# Patient Record
Sex: Male | Born: 1965
Health system: Southern US, Community
[De-identification: ages and names within clinical notes are randomized; demographics above are authoritative.]

## PROBLEM LIST (undated history)

## (undated) DIAGNOSIS — G709 Myoneural disorder, unspecified: Secondary | ICD-10-CM

## (undated) DIAGNOSIS — L02419 Cutaneous abscess of limb, unspecified: Secondary | ICD-10-CM

## (undated) DIAGNOSIS — R55 Syncope and collapse: Secondary | ICD-10-CM

## (undated) DIAGNOSIS — I6529 Occlusion and stenosis of unspecified carotid artery: Secondary | ICD-10-CM

## (undated) DIAGNOSIS — I442 Atrioventricular block, complete: Secondary | ICD-10-CM

## (undated) DIAGNOSIS — Z950 Presence of cardiac pacemaker: Secondary | ICD-10-CM

## (undated) DIAGNOSIS — M51369 Other intervertebral disc degeneration, lumbar region without mention of lumbar back pain or lower extremity pain: Secondary | ICD-10-CM

## (undated) DIAGNOSIS — I10 Essential (primary) hypertension: Secondary | ICD-10-CM

## (undated) DIAGNOSIS — L03119 Cellulitis of unspecified part of limb: Secondary | ICD-10-CM

## (undated) DIAGNOSIS — E785 Hyperlipidemia, unspecified: Secondary | ICD-10-CM

## (undated) DIAGNOSIS — I429 Cardiomyopathy, unspecified: Secondary | ICD-10-CM

## (undated) DIAGNOSIS — Q688 Other specified congenital musculoskeletal deformities: Secondary | ICD-10-CM

## (undated) DIAGNOSIS — M5136 Other intervertebral disc degeneration, lumbar region: Secondary | ICD-10-CM

## (undated) HISTORY — DX: Essential (primary) hypertension: I10

## (undated) HISTORY — DX: Hyperlipidemia, unspecified: E78.5

## (undated) HISTORY — DX: Syncope and collapse: R55

## (undated) HISTORY — DX: Occlusion and stenosis of unspecified carotid artery: I65.29

## (undated) HISTORY — PX: OTHER SURGICAL HISTORY: SHX169

## (undated) HISTORY — DX: Cellulitis of unspecified part of limb: L03.119

## (undated) HISTORY — DX: Other intervertebral disc degeneration, lumbar region: M51.36

## (undated) HISTORY — DX: Other specified congenital musculoskeletal deformities: Q68.8

## (undated) HISTORY — DX: Cutaneous abscess of limb, unspecified: L02.419

## (undated) HISTORY — DX: Other intervertebral disc degeneration, lumbar region without mention of lumbar back pain or lower extremity pain: M51.369

---

## 1986-04-02 HISTORY — PX: PATELLA FRACTURE SURGERY: SHX735

## 1991-04-03 HISTORY — PX: PATELLA FRACTURE SURGERY: SHX735

## 2007-06-17 ENCOUNTER — Ambulatory Visit: Payer: Self-pay | Admitting: *Deleted

## 2008-03-30 ENCOUNTER — Ambulatory Visit: Payer: Self-pay | Admitting: Cardiology

## 2008-03-30 ENCOUNTER — Observation Stay (HOSPITAL_COMMUNITY): Admission: EM | Admit: 2008-03-30 | Discharge: 2008-03-31 | Payer: Self-pay | Admitting: Emergency Medicine

## 2008-03-31 ENCOUNTER — Encounter: Payer: Self-pay | Admitting: Cardiology

## 2008-04-06 ENCOUNTER — Ambulatory Visit: Payer: Self-pay

## 2008-04-06 ENCOUNTER — Ambulatory Visit: Payer: Self-pay | Admitting: Cardiology

## 2008-04-06 LAB — CONVERTED CEMR LAB
Basophils Absolute: 0 10*3/uL (ref 0.0–0.1)
Eosinophils Relative: 1.9 % (ref 0.0–5.0)
Lymphocytes Relative: 25.5 % (ref 12.0–46.0)
MCHC: 33.8 g/dL (ref 30.0–36.0)
MCV: 94.2 fL (ref 78.0–100.0)
Monocytes Relative: 8.4 % (ref 3.0–12.0)
Neutrophils Relative %: 64.1 % (ref 43.0–77.0)
RDW: 13 % (ref 11.5–14.6)
WBC: 10 10*3/uL (ref 4.5–10.5)

## 2008-04-08 ENCOUNTER — Ambulatory Visit: Payer: Self-pay | Admitting: Cardiology

## 2008-04-28 ENCOUNTER — Ambulatory Visit (HOSPITAL_COMMUNITY): Admission: RE | Admit: 2008-04-28 | Discharge: 2008-04-28 | Payer: Self-pay | Admitting: Surgery

## 2008-04-28 ENCOUNTER — Encounter (INDEPENDENT_AMBULATORY_CARE_PROVIDER_SITE_OTHER): Payer: Self-pay | Admitting: Surgery

## 2008-05-18 ENCOUNTER — Ambulatory Visit: Payer: Self-pay | Admitting: Cardiology

## 2008-05-18 ENCOUNTER — Ambulatory Visit: Payer: Self-pay

## 2008-05-18 LAB — CONVERTED CEMR LAB
ALT: 38 units/L (ref 0–53)
Albumin: 4.1 g/dL (ref 3.5–5.2)
Alkaline Phosphatase: 74 units/L (ref 39–117)
Bilirubin, Direct: 0.1 mg/dL (ref 0.0–0.3)
Cholesterol: 138 mg/dL (ref 0–200)
Total Bilirubin: 0.8 mg/dL (ref 0.3–1.2)
Total CHOL/HDL Ratio: 4.2

## 2008-08-16 ENCOUNTER — Encounter (INDEPENDENT_AMBULATORY_CARE_PROVIDER_SITE_OTHER): Payer: Self-pay | Admitting: *Deleted

## 2008-10-05 ENCOUNTER — Ambulatory Visit (HOSPITAL_COMMUNITY): Admission: RE | Admit: 2008-10-05 | Discharge: 2008-10-05 | Payer: Self-pay | Admitting: Orthopedic Surgery

## 2008-10-08 ENCOUNTER — Encounter: Admission: RE | Admit: 2008-10-08 | Discharge: 2008-10-08 | Payer: Self-pay | Admitting: Orthopedic Surgery

## 2008-10-19 ENCOUNTER — Encounter: Admission: RE | Admit: 2008-10-19 | Discharge: 2008-10-19 | Payer: Self-pay | Admitting: Orthopedic Surgery

## 2009-05-31 ENCOUNTER — Observation Stay (HOSPITAL_COMMUNITY)
Admission: EM | Admit: 2009-05-31 | Discharge: 2009-06-01 | Payer: Self-pay | Source: Home / Self Care | Admitting: Emergency Medicine

## 2009-05-31 ENCOUNTER — Ambulatory Visit: Payer: Self-pay | Admitting: Cardiovascular Disease

## 2009-06-01 ENCOUNTER — Encounter (INDEPENDENT_AMBULATORY_CARE_PROVIDER_SITE_OTHER): Payer: Self-pay | Admitting: Internal Medicine

## 2009-06-09 ENCOUNTER — Ambulatory Visit: Payer: Self-pay | Admitting: Cardiology

## 2009-06-28 DIAGNOSIS — E785 Hyperlipidemia, unspecified: Secondary | ICD-10-CM

## 2009-06-28 DIAGNOSIS — R55 Syncope and collapse: Secondary | ICD-10-CM

## 2009-06-29 ENCOUNTER — Ambulatory Visit: Payer: Self-pay | Admitting: Internal Medicine

## 2009-06-29 ENCOUNTER — Encounter: Payer: Self-pay | Admitting: Physician Assistant

## 2009-06-29 DIAGNOSIS — L02419 Cutaneous abscess of limb, unspecified: Secondary | ICD-10-CM

## 2009-06-29 DIAGNOSIS — L03119 Cellulitis of unspecified part of limb: Secondary | ICD-10-CM

## 2009-06-29 DIAGNOSIS — Q743 Arthrogryposis multiplex congenita: Secondary | ICD-10-CM

## 2009-07-19 ENCOUNTER — Telehealth (INDEPENDENT_AMBULATORY_CARE_PROVIDER_SITE_OTHER): Payer: Self-pay | Admitting: *Deleted

## 2009-08-09 ENCOUNTER — Ambulatory Visit: Payer: Self-pay | Admitting: Cardiology

## 2009-08-09 DIAGNOSIS — R9430 Abnormal result of cardiovascular function study, unspecified: Secondary | ICD-10-CM | POA: Insufficient documentation

## 2009-08-17 ENCOUNTER — Ambulatory Visit: Payer: Self-pay | Admitting: Cardiology

## 2009-08-18 LAB — CONVERTED CEMR LAB
CO2: 26 meq/L (ref 19–32)
Glucose, Bld: 125 mg/dL — ABNORMAL HIGH (ref 70–99)
Potassium: 4.3 meq/L (ref 3.5–5.1)

## 2010-03-02 ENCOUNTER — Telehealth: Payer: Self-pay | Admitting: Cardiology

## 2010-03-10 ENCOUNTER — Ambulatory Visit: Payer: Self-pay | Admitting: Cardiology

## 2010-03-13 LAB — CONVERTED CEMR LAB
Calcium: 10.1 mg/dL (ref 8.4–10.5)
GFR calc non Af Amer: 206.76 mL/min (ref >60.00)

## 2010-03-28 ENCOUNTER — Ambulatory Visit: Payer: Self-pay | Admitting: Internal Medicine

## 2010-03-29 LAB — CONVERTED CEMR LAB
ALT: 33 units/L (ref 0–53)
Alkaline Phosphatase: 75 units/L (ref 39–117)
BUN: 18 mg/dL (ref 6–23)
Basophils Relative: 0.3 % (ref 0.0–3.0)
Blood, UA: NEGATIVE
Chloride: 105 meq/L (ref 96–112)
Cholesterol: 220 mg/dL — ABNORMAL HIGH (ref 0–200)
Creatinine, Ser: 0.5 mg/dL (ref 0.4–1.5)
Direct LDL: 153 mg/dL
Eosinophils Absolute: 0.3 10*3/uL (ref 0.0–0.7)
Eosinophils Relative: 1.8 % (ref 0.0–5.0)
Glucose, Bld: 82 mg/dL (ref 70–99)
HCT: 47.2 % (ref 39.0–52.0)
HDL: 34.7 mg/dL — ABNORMAL LOW (ref >39.00)
Ketones, ur: NEGATIVE mg/dL
Lymphocytes Relative: 31.2 % (ref 12.0–46.0)
MCHC: 34 g/dL (ref 30.0–36.0)
Monocytes Absolute: 0.8 10*3/uL (ref 0.1–1.0)
Monocytes Relative: 5.8 % (ref 3.0–12.0)
Neutrophils Relative %: 60.9 % (ref 43.0–77.0)
Potassium: 4.8 meq/L (ref 3.5–5.1)
RBC: 5.03 M/uL (ref 4.22–5.81)
RDW: 13.7 % (ref 11.5–14.6)
Sodium: 138 meq/L (ref 135–145)
TSH: 1.44 microintl units/mL (ref 0.35–5.50)
Urobilinogen, UA: 0.2 (ref 0.0–1.0)

## 2010-03-30 ENCOUNTER — Encounter: Payer: Self-pay | Admitting: Internal Medicine

## 2010-03-30 ENCOUNTER — Ambulatory Visit: Payer: Self-pay | Admitting: Internal Medicine

## 2010-03-30 DIAGNOSIS — M129 Arthropathy, unspecified: Secondary | ICD-10-CM | POA: Insufficient documentation

## 2010-03-31 DIAGNOSIS — M5137 Other intervertebral disc degeneration, lumbosacral region: Secondary | ICD-10-CM

## 2010-03-31 DIAGNOSIS — H6120 Impacted cerumen, unspecified ear: Secondary | ICD-10-CM | POA: Insufficient documentation

## 2010-03-31 DIAGNOSIS — I10 Essential (primary) hypertension: Secondary | ICD-10-CM

## 2010-03-31 DIAGNOSIS — R9431 Abnormal electrocardiogram [ECG] [EKG]: Secondary | ICD-10-CM | POA: Insufficient documentation

## 2010-05-02 NOTE — Assessment & Plan Note (Signed)
Summary: eph/jml  Medications Added CELEBREX 200 MG CAPS (CELECOXIB) take one daily LOTENSIN 10 MG TABS (BENAZEPRIL HCL) take one tablet by mouth daily        Visit Type:  Follow-up  CC:  occ. dizziness.  History of Present Illness: This is a 45 year old white male patient, who was admitted to [facility] with syncope, secondary to micturition. 2-D echo was performed and showed abnormal septal motion. The cavity size was normal. Wall thickness was increased in a pattern of severe LVH. Ejection fraction 50-55%.  Patient was sent home with a CardioNet monitor, which showed sinus rhythm with first degree AV block interventricular conduction delay, some sinus tachycardia, PACs, ventricular couplet.  The patient had one episode of dizziness while at work when he stood up from a sitting position to go to the restroom. He stood there a few seconds, and it resolved. He had no syncope. He denies palpitations, dyspnea, or dyspnea on exertion.  Current Medications (verified): 1)  Celebrex 200 Mg Caps (Celecoxib) .... Take One Daily  Social History: Reviewed history from 06/28/2009 and no changes required. He lives in [place] with his wife.  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  He   denies tobacco, alcohol, or drug use.  He has limited mobility, but   tries leg lifts twice a day.  He is able to walk for routine things, but   if he needed to be in a seat for a while, he would use a wheelchair or   scooter.   Review of Systems       see history of present illness. He has no limitations because of his congenital arthrogyroposis Multiplex  Vital Signs:  Patient profile:   45 year old male Height:      66 inches Weight:      191 pounds BMI:     30.94 Pulse rate:   92 / minute Pulse (ortho):   101 / minute Pulse rhythm:   regular BP sitting:   134 / 99  (right arm) BP standing:   146 / 105  Vitals Entered By: Jacquelin Hawking, CMA  (June 29, 2009 9:00 AM)  Serial Vital Signs/Assessments:  Time      Position  BP       Pulse  Resp  Temp     By 9:48 AM   Sitting   136/99   86                    Ollen Gross, RN, BSN 9:48 AM   Standing  146/105  101                   Ollen Gross, RN, BSN           Standing  151/111  96                    Ollen Gross, RN, BSN           Standing  146/106  101                   Ollen Gross, RN, BSN   Physical Exam  General:   Well-nournished, in no acute distress.short stature Neck: No JVD, HJR, Bruit, or thyroid enlargement Lungs: No tachypnea, clear without wheezing, rales, or rhonchi Cardiovascular: RRR, PMI not displaced, heart sounds normal,positive S4, no murmurs, bruit, thrill, or heave. Abdomen: BS normal. Soft without organomegaly, masses, lesions  or tenderness. Extremities:slight cyanosis in the right lower leg, which is chronic, bilateral ankle edema,Good distal pulses bilateral SKin: Warm, no lesions or rashes  Musculoskeletal: arm and leg deformities, making it difficult to walk Neuro: no focal signs    EKG  Procedure date:  06/29/2009  Findings:      sinus rhythm with first-degree AV block and right bundle branch block  Impression & Recommendations:  Problem # 1:  HYPERTENSION, HX OF (ICD-V12.50) Patient's blood pressure is elevated today, and his blood pressures actually go up when we did orthostatics. We could not lay him down because of his congenital abnormality. He could not get up on the table. He had severe LVH on his 2-D echo. I discussed this patient with Dr. Jens Som, who recommends an ACE inhibitor for his hypertension. We talked about a beta blocker, but he has a first degree AV block and right bundle branch block on EKG so we chose Ace inhibitor  Problem # 2:  CELLULITIS, LEG, RIGHT (ICD-682.6) Cellulitis of the right lower extremity has resolved  Problem # 3:  SYNCOPE (ICD-780.2)  Patient has not had any further syncope. He had one episode  of dizziness when he got up to go to the bathroom. His blood pressure actually increases when he gets up because physically. He has to work so hard to change positions.CardioNet monitor did not reveal any arrhythmias that would cause syncope. His updated medication list for this problem includes:    Lotensin 10 Mg Tabs (Benazepril hcl) .Marland Kitchen... 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  Patient Instructions: 1)  Your physician recommends that you schedule a follow-up appointment in: 1 month with Dr. Jens Som. 2)  Your physician has recommended you make the following change in your medication: Lotensin 10 mg. Take one tablet by mouth once a day. 3)  Your physician has requested that you limit the intake of sodium (salt) in your diet to two grams daily. Please see [facility] handout. 4)  Check Blood Pressure at home and call if it remains greater than 135/85 Prescriptions: LOTENSIN 10 MG TABS (BENAZEPRIL HCL) take one tablet by mouth daily  #30 x 6   Entered by:   Ollen Gross, RN, BSN   Authorized by:   Marletta Lor, PA-C   Signed by:   Ollen Gross, RN, BSN on 06/29/2009   Method used:   Electronically to        [facility]* (retail)       [address]       [place], [place]  [id number]       Ph: [phone]       Fax: [phone]   RxID:   [id number]

## 2010-05-02 NOTE — Assessment & Plan Note (Signed)
Summary: 1 MONTH ROV.SL  Medications Added LOTENSIN 20 MG TABS (BENAZEPRIL HCL) 1 tablet by mouth daily      Allergies Added: NKDA  Visit Type:  Follow-up Primary Provider:  none  CC:  pt  has been doing good no dizziness.  History of Present Illness: William Hartman is a very pleasant gentleman with a history of micturition syncope.   An echocardiogram in March of 2011 revealed  an ejection fraction of 50- 55% with severe LVH and mild left atrial enlargement.  A monitor showed sinus rhythm with first degree AV block, PVCs and couplets. A Myoview performed on April 06, 2008 for  left upper extremity numbness and an abnormal electrocardiogram showed an ejection fraction of 75%.  There was a prior lateral infarct versus soft tissue attenuation (the patient was imaged with his arms down).  There was also a question of very mild lateral ischemia.  He is felt to be at low risk.  since he was discharged from the hospital in March he was seen in the office and his blood pressure was elevated. An ACE inhibitor was added. Since then the patient denies any dyspnea on exertion, orthopnea, PND, pedal edema, palpitations, syncope or chest pain.   Current Medications (verified): 1)  Celebrex 200 Mg Caps (Celecoxib) .... Take One Daily 2)  Lotensin 10 Mg Tabs (Benazepril Hcl) .... Take One Tablet By Mouth Daily  Allergies (verified): No Known Drug Allergies  Past History:  Past Medical History:  1. Syncope.   2. History of abnormal ECG.       a.     History of first-degree AV block, right bundle-branch block,        and inferior T-wave inversion dating back to December 2009.       b.     A 2-D echocardiogram March 31, 2008, EF 55%.       c.     April 06, 2008, pharmacologic Myoview showing an EF of 75%        with question of mild lateral ischemia and lateral infarct,        although the patient had imaging with arms down.  Felt to be low        risk study.   3. Hyperlipidemia.   4. History of  right lower extremity cellulitis.   5. Hypertension.   6. History of left upper extremity numbness in December 2009 with       negative neurologic evaluation.   7. Status post right lateral knee mass excision.   8. Arthrogryposis multiplex congenita with multiple surgeries as a       child.      Past Surgical History: Reviewed history from 06/28/2009 and no changes required. Status post right lateral knee mass excision.  Arthrogryposis multiplex congenita with multiple surgeries as a       child.   Social History: Reviewed history from 06/28/2009 and no changes required. He lives in [place] with his wife.  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  He   denies tobacco, alcohol, or drug use.  He has limited mobility, but   tries leg lifts twice a day.  He is able to walk for routine things, but   if he needed to be in a seat for a while, he would use a wheelchair or   scooter.   Review of Systems       no fevers or chills, productive cough, hemoptysis, dysphasia, odynophagia,  melena, hematochezia, dysuria, hematuria, rash, seizure activity, orthopnea, PND, pedal edema, claudication. Remaining systems are negative.   Vital Signs:  Patient profile:   45 year old male Height:      66 inches Weight:      188 pounds BMI:     30.45 Pulse rate:   94 / minute BP sitting:   132 / 90  (left arm)  Vitals Entered By: Burnett Kanaris, CNA (Aug 09, 2009 8:16 AM)  Physical Exam  General:  Well-developed well-nourished in no acute distress.  Skin is warm and dry.  HEENT is normal.  Neck is supple. No thyromegaly.  Chest is clear to auscultation with normal expansion.  Cardiovascular exam is regular rate and rhythm.  Abdominal exam nontender or distended. No masses palpated. Extremities show Trace edema; there is mild erythema over the lower shin area from prior infection. Note he has congenital abnormalities of all of his extremities. neuro  grossly intact    Impression & Recommendations:  Problem # 1:  HYPERTENSION, HX OF (ICD-V12.50) Blood pressure elevated. Increase Lotensin to 20 mg p.o. daily. Bmet in one week.  Problem # 2:  SYNCOPE (ICD-780.2) No recurrent episodes. This was felt secondary to micturition previously. His updated medication list for this problem includes:    Lotensin 20 Mg Tabs (Benazepril hcl) .Marland Kitchen... 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 I've asked him to establish with a primary care physician for his long term noncardiac care.  Problem # 5:  CARDIOVASCULAR FUNCTION STUDY, ABNORMAL (ICD-794.30) No symptoms. No further workup at this time. Consider repeat in future.  Patient Instructions: 1)  Your physician recommends that you schedule a follow-up appointment in: 12 months with Dr. Jens Som 2)  Your physician recommends that you return for lab work in 1 WEEK FOR  BMET  401.1, 780.2 3)  Your physician has recommended you make the following change in your medication: LOTENSIN INCREASED TO 20MG  DAILY Prescriptions: LOTENSIN 20 MG TABS (BENAZEPRIL HCL) 1 tablet by mouth daily  #30 x 11   Entered by:   Lisabeth Devoid RN   Authorized by:   Ferman Hamming, MD, [facility]   Signed by:   Lisabeth Devoid RN on 08/09/2009   Method used:   Electronically to        [facility]* (retail)       [address]       [place], [place]  [id number]       Ph: [phone]       Fax: [phone]   RxID:   [id number]

## 2010-05-02 NOTE — Progress Notes (Signed)
   Phone Note Outgoing Call   Call placed by: Deliah Goody, RN,  July 19, 2009 5:00 PM Summary of Call: event monitor reviewed by dr Jens Som and shows sinus to sinus tach with 1st degree AV block Left message to call back Deliah Goody, RN  July 19, 2009 5:02 PM   Follow-up for Phone Call        pt aware of results Deliah Goody, RN  July 20, 2009 11:35 AM

## 2010-05-02 NOTE — Progress Notes (Signed)
Summary: calling with medication questions  Medications Added LOSARTAN POTASSIUM 50 MG TABS (LOSARTAN POTASSIUM) 1 once daily       Phone Note Call from Patient Call back at Home Phone [phone]   Caller: Patient Summary of Call: Pt calling with questions about changing his medication Initial call taken by: Judie Grieve,  March 02, 2010 4:28 PM  Follow-up for Phone Call        spoke with pt, since he started the benazepril he has a cough. he stopped the med about one month ago and the cough went away. his bp is running 90 to 100 on the bottom. he uses [facility]. he wants a message left at [phone] if he does not answer. will foward for dr Jens Som review Deliah Goody, RN  March 02, 2010 5:18 PM  Additional Follow-up for Phone Call Additional follow up Details #1::        dc lotensin; cozaar 50 mg by mouth daily. BMET one week Ferman Hamming, MD, [facility]  March 02, 2010 7:16 PM     Additional Follow-up for Phone Call Additional follow up Details #2::    LEFT MESSAGE  ON VM THAT MED WAS CALLED IN TO [facility] AND THE NEED TO COME IN NEXT WEEK FOR BLOOD TEST . Follow-up by: Scherrie Bateman, LPN,  March 03, 2010 1:24 PM  New/Updated Medications: LOSARTAN POTASSIUM 50 MG TABS (LOSARTAN POTASSIUM) 1 once daily Prescriptions: LOSARTAN POTASSIUM 50 MG TABS (LOSARTAN POTASSIUM) 1 once daily  #30 x 11   Entered by:   Scherrie Bateman, LPN   Authorized by:   Ferman Hamming, MD, [facility]   Signed by:   Scherrie Bateman, LPN on 21/30/8657   Method used:   Electronically to        [facility] (retail)       [address]       [place], [place]  [id number]       Ph: [phone]       Fax: [phone]   RxID:   [id number]

## 2010-05-04 NOTE — Assessment & Plan Note (Signed)
Summary: New / Cpx / # / cd   Vital Signs:  Patient profile:   45 year old male Height:      66 inches (167.64 cm) Weight:      187.0 pounds (85 kg) Temp:     97.8 degrees F (36.56 degrees C) oral Pulse rate:   64 / minute BP sitting:   128 / 90  (left arm) Cuff size:   regular  Vitals Entered By: Orlan Leavens RMA (March 30, 2010 8:25 AM) CC: New patient Is Patient Diabetic? No Pain Assessment Patient in pain? no        Primary Care Provider:  Newt Lukes MD  CC:  New patient.  History of Present Illness: new pt to me and our divison, but known to our practice with cardiology -here to est care patient is here today for annual physical. Patient feels well and has no complaints.  also reviewed chronic med issues today: HTN - reports compliance with ongoing medical treatment and no changes in medication dose or frequency. denies adverse side effects related to current therapy.   dyslipidemia - hx tx with zocor - stopped due to "not needed anymore" when cholesterol was good - denies adv effects of prior statin tx - intermit compliance with low fat diet  AMC (arthrogryposis multiplex congenita) - mulitple surg for same as child - stable symptoms - able to walk indep since back injections for DDD in 2010 - follows regularly with ortho for same (voytek) - requests rx for new WC rx for "leverage ride" to have front control access rather than manual use of arms/hands  Preventive Screening-Counseling & Management  Alcohol-Tobacco     Alcohol drinks/day: <1     Alcohol Counseling: not indicated; use of alcohol is not excessive or problematic     Smoking Status: never     Tobacco Counseling: not indicated; no tobacco use  Caffeine-Diet-Exercise     Diet Counseling: to improve diet; diet is suboptimal     Does Patient Exercise: yes     Depression Counseling: not indicated; screening negative for depression  Safety-Violence-Falls     Seat Belt Counseling: not indicated;  patient wears seat belts     Helmet Counseling: not indicated; patient wears helmet when riding bicycle/motocycle     Firearm Counseling: not indicated; uses recommended firearm safety measures     Violence Counseling: not indicated; no violence risk noted     Fall Risk Counseling: not indicated; no significant falls noted  Clinical Review Panels:  Prevention   Last PSA:  1.24 (03/28/2010)  Immunizations   Last Tetanus Booster:  Historical (04/02/2008)   Last Pneumovax:  Historical (04/02/2008)  Lipid Management   Cholesterol:  220 (03/28/2010)   LDL (bad choesterol):  69 (05/18/2008)   HDL (good cholesterol):  34.70 (03/28/2010)  CBC   WBC:  13.9 (03/28/2010)   RBC:  5.03 (03/28/2010)   Hgb:  16.0 (03/28/2010)   Hct:  47.2 (03/28/2010)   Platelets:  308.0 (03/28/2010)   MCV  93.8 (03/28/2010)   MCHC  34.0 (03/28/2010)   RDW  13.7 (03/28/2010)   PMN:  60.9 (03/28/2010)   Lymphs:  31.2 (03/28/2010)   Monos:  5.8 (03/28/2010)   Eosinophils:  1.8 (03/28/2010)   Basophil:  0.3 (03/28/2010)  Complete Metabolic Panel   Glucose:  82 (03/28/2010)   Sodium:  138 (03/28/2010)   Potassium:  4.8 (03/28/2010)   Chloride:  105 (03/28/2010)   CO2:  21 (03/28/2010)   BUN:  18 (03/28/2010)   Creatinine:  0.5 (03/28/2010)   Albumin:  4.1 (03/28/2010)   Total Protein:  7.7 (03/28/2010)   Calcium:  9.7 (03/28/2010)   Total Bili:  0.5 (03/28/2010)   Alk Phos:  75 (03/28/2010)   SGPT (ALT):  33 (03/28/2010)   SGOT (AST):  25 (03/28/2010)   Current Medications (verified): 1)  Celebrex 200 Mg Caps (Celecoxib) .... Take One Daily 2)  Losartan Potassium 50 Mg Tabs (Losartan Potassium) .Marland Kitchen.. 1 Once Daily  Allergies (verified): No Known Drug Allergies  Past History:  Past Medical History:  1. Syncope, micturition - hosp 05/2009  2. History of abnormal ECG      a.     History of first-degree AV block, right bundle-branch block,        and inferior T-wave inversion dating back to  December 2009.       b.     A 2-D echocardiogram March 31, 2008, EF 55%.       c.     April 06, 2008, pharmacologic Myoview showing an EF of 75%        with question of mild lateral ischemia and lateral infarct,        although the patient had imaging with arms down.  Felt to be low        risk study.   3. Hyperlipidemia  4.  Hypertension.   5. History of left upper extremity numbness in December 2009 with       negative neurologic evaluation.   6.. Status post right lateral knee mass excision 2010  7. Arthrogryposis multiplex congenita with multiple surgeries as a       child.   8. DDD, lumbar    MD roster: card - Jens Som ortho -voytek  Past Surgical History: Status post right lateral knee mass excision Arthrogryposis multiplex congenita with multiple foot/ankle surgeries as a child  Family History: pt adopted - limited knowledge of birth dad or paternal family Mother died of breast and lung cancer at 32.   His brother is alive and well.   Social History: He lives in [place] with his wife.   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   He denies tobacco, regular alcohol, or any drug use.   He has limited mobility,but is able to walk for routine things if needs to be up for long while,uses a wheelchair or scooter. Smoking Status:  never Does Patient Exercise:  yes  Review of Systems       c/o decreased hearing from right ear over past few months - occ "itch" sensation in same ear - otehrwise, see HPI above. I have reviewed all other systems and they were negative.   Physical Exam  General:  alert, well-developed, well-nourished, and cooperative to examination.    Head:  Normocephalic and atraumatic without obvious abnormalities. No apparent alopecia or balding. Eyes:  vision grossly intact; pupils equal, round and reactive to light.  conjunctiva and lids normal.    Ears:  R TM obscured with hard cerumen - after irrigation and  removal, normal pinnae bilaterally, without erythema, swelling, or tenderness to palpation. TMs clear, without effusion, or cerumen impaction. Hearing grossly normal bilaterally  Mouth:  teeth and gums in good repair; mucous membranes moist, without lesions or ulcers. oropharynx clear without exudate, no erythema.  Neck:  supple, full ROM, no masses, no thyromegaly; no thyroid nodules or tenderness. no JVD or carotid bruits.   Lungs:  normal respiratory  effort, no intercostal retractions or use of accessory muscles; normal breath sounds bilaterally - no crackles and no wheezes.    Heart:  normal rate, regular rhythm, no murmur, and no rub. BLE without edema. diminished pulses BLE, cool to touch with chronic venous changes distal/medial legs Abdomen:  soft, non-tender, normal bowel sounds, no distention; no masses and no appreciable hepatomegaly or splenomegaly.   Rectal:  defer Prostate:  defer Msk:  deformities of distal B UE>LE c/w congenital AMC changes - functional use of hands/UE and able to walk unaided at  this time Neurologic:  alert & oriented X3 and cranial nerves II-XII symetrically intact.  strength normal in all extremities, sensation intact to light touch, and gait normal. speech fluent without dysarthria or aphasia; follows commands with good comprehension.  Skin:  chronic violecous changes medial distal shins and cool LE - otherwise, no rashes, vesicles, ulcers, or erythema. No nodules or irregularity to palpation.  Psych:  very pleaseant and upbeat, Oriented X3, memory intact for recent and remote, normally interactive, good eye contact, not anxious appearing, not depressed appearing, and not agitated.    Additional Exam:  Procedure: ear irrigation, right Reason: wax impaction Risk/Benefit were discussed with patient who agrees to proceed. right ear was irrigated with warm water. Large amount of hard wax was removed. Instrumentation with metal ear loop was performed to accomplish the  wax removal. Patient tolerated procedure well, mild superfical abrasion to canal Complications: none    Impression & Recommendations:  Problem # 1:  PREVENTIVE HEALTH CARE (ICD-V70.0) Patient has been counseled on age-appropriate routine health concerns for screening and prevention. These are reviewed and up-to-date. Immunizations are up-to-date or declined. Labs and ECG reviewed.  Orders: EKG w/ Interpretation (93000)  Problem # 2:  HYPERLIPIDEMIA (ICD-272.4)  resume low dose stain now - discussed same with pt who agrees - erx done he will also work on his diet efforts as he would like to avoid meds as much as possible His updated medication list for this problem includes:    Simvastatin 10 Mg Tabs (Simvastatin) .Marland Kitchen... 1 by mouth at bedtime  Labs Reviewed: SGOT: 25 (03/28/2010)   SGPT: 33 (03/28/2010)   HDL:34.70 (03/28/2010), 33.0 (05/18/2008)  LDL:69 (05/18/2008)  Chol:220 (03/28/2010), 138 (05/18/2008)  Trig:279.0 (03/28/2010), 182 (05/18/2008)  Orders: Prescription Created Electronically [phone])  Problem # 3:  ABNORMAL ELECTROCARDIOGRAM (ICD-794.31) hx same (see PMH) - RBBB, !st degree AVB  stable ecg from 3/30/11reviewed today  - no symptoms   Problem # 4:  HYPERTENSION (ICD-401.9)  His updated medication list for this problem includes:    Losartan Potassium 50 Mg Tabs (Losartan potassium) .Marland Kitchen... 1 once daily  BP today: 128/90 Prior BP: 132/90 (08/09/2009)  Labs Reviewed: K+: 4.8 (03/28/2010) Creat: : 0.5 (03/28/2010)   Chol: 220 (03/28/2010)   HDL: 34.70 (03/28/2010)   LDL: 69 (05/18/2008)   TG: 279.0 (03/28/2010)  Problem # 5:  DEGENERATIVE DISC DISEASE, LUMBAR SPINE (ICD-722.52) follows with ortho - voytek for same symptoms much imporved since "injections" in 2010 -walking improved ok to cont celebrex once daily as ongoing   Problem # 6:  ARTHROGRYPOSIS MULTIPLEX CONGENITA (ICD-754.89) congenital, chronic Mskel deformities which pt does well to compensate  for rx for "levirage ride" WC (front controlled WC) provided for same today due to difficult with UE and hand strength to control manual WC  Problem # 7:  CERUMEN IMPACTION, RIGHT (ICD-380.4)  irrigation today w/o complication  Orders: Cerumen Impaction Removal (52841)  Complete Medication List: 1)  Celebrex 200 Mg Caps (Celecoxib) .... Take one daily 2)  Losartan Potassium 50 Mg Tabs (Losartan potassium) .Marland Kitchen.. 1 once daily 3)  Simvastatin 10 Mg Tabs (Simvastatin) .Marland Kitchen.. 1 by mouth at bedtime 4)  Leverage Ride Wheelchair  .... Front control wheelchair difficulty controlling manual wc due to lack of b ue/hand  strength duration of rx need: lifetime icd9: amc (754.89), lumbar ddd ((722.52)  Patient Instructions: 1)  it was good to meet you today. 2)  exam and EKG and labs reviewed - high cholesterol but no other problems 3)  your right ear has been irrigated today for wax removal 4)  start low dose simvastatin for your cholesterol - your prescription has been electronically submitted to your pharmacy. Please take as directed. Contact our office if you believe you're having problems with the medication(s).  5)  no other medication changes recommended 6)  Please schedule a follow-up appointment in 3 months to review chlesterol, blood pressure and medications; call sooner if problems. Come in day before visit for lab work: lipids (272.4) and LFTs (v58.69) Prescriptions: LEVERAGE RIDE WHEELCHAIR front control wheelchair difficulty controlling manual WC due to lack of B UE/hand  strength duration of rx need: lifetime ICD9: AMC (754.89), lumbar DDD ((722.52)  #1 x 0   Entered and Authorized by:   Newt Lukes MD   Signed by:   Newt Lukes MD on 03/31/2010   Method used:   Print then Mail to Patient   RxID:   [id number] SIMVASTATIN 10 MG TABS (SIMVASTATIN) 1 by mouth at bedtime  #30 x 3   Entered and Authorized by:   Newt Lukes MD   Signed by:   Newt Lukes MD on  03/30/2010   Method used:   Electronically to        [facility]* (retail)       [address]       [place], [place]  [id number]       Ph: [phone]       Fax: [phone]   RxID:   [id number]    Orders Added: 1)  EKG w/ Interpretation [93000] 2)  New Patient 40-64 years [99386] 3)  New Patient Level III [99203] 4)  Prescription Created Electronically [G8553] 5)  Cerumen Impaction Removal [56433]   Immunization History:  Tetanus/Td Immunization History:    Tetanus/Td:  historical (04/02/2008)  Pneumovax Immunization History:    Pneumovax:  historical (04/02/2008)   Immunization History:  Tetanus/Td Immunization History:    Tetanus/Td:  Historical (04/02/2008)  Pneumovax Immunization History:    Pneumovax:  Historical (04/02/2008)

## 2010-06-21 ENCOUNTER — Other Ambulatory Visit: Payer: Self-pay

## 2010-06-21 ENCOUNTER — Encounter: Payer: Self-pay | Admitting: *Deleted

## 2010-06-26 LAB — COMPREHENSIVE METABOLIC PANEL
ALT: 36 U/L (ref 0–53)
AST: 27 U/L (ref 0–37)
Albumin: 4.2 g/dL (ref 3.5–5.2)
Alkaline Phosphatase: 77 U/L (ref 39–117)
CO2: 23 mEq/L (ref 19–32)
Calcium: 10.3 mg/dL (ref 8.4–10.5)
Creatinine, Ser: 0.46 mg/dL (ref 0.4–1.5)
GFR calc Af Amer: >60 mL/min (ref >60)
GFR calc non Af Amer: >60 mL/min (ref >60)
Total Bilirubin: 0.9 mg/dL (ref 0.3–1.2)
Total Protein: 8.6 g/dL — ABNORMAL HIGH (ref 6.0–8.3)

## 2010-06-26 LAB — CBC
HCT: 48.8 % (ref 39.0–52.0)
Hemoglobin: 16.9 g/dL (ref 13.0–17.0)
MCV: 93.8 fL (ref 78.0–100.0)
WBC: 14.5 10*3/uL — ABNORMAL HIGH (ref 4.0–10.5)

## 2010-06-26 LAB — DIFFERENTIAL
Basophils Absolute: 0 10*3/uL (ref 0.0–0.1)
Eosinophils Relative: 0 % (ref 0–5)
Lymphocytes Relative: 15 % (ref 12–46)
Neutro Abs: 11.7 10*3/uL — ABNORMAL HIGH (ref 1.7–7.7)
Neutrophils Relative %: 81 % — ABNORMAL HIGH (ref 43–77)

## 2010-06-26 LAB — LIPID PANEL
LDL Cholesterol: 139 mg/dL — ABNORMAL HIGH (ref 0–99)
VLDL: 34 mg/dL (ref 0–40)

## 2010-06-26 LAB — RAPID URINE DRUG SCREEN, HOSP PERFORMED
Amphetamines: NOT DETECTED
Benzodiazepines: NOT DETECTED
Tetrahydrocannabinol: NOT DETECTED

## 2010-06-26 LAB — CARDIAC PANEL(CRET KIN+CKTOT+MB+TROPI)
CK, MB: 3.2 ng/mL (ref 0.3–4.0)
Total CK: 218 U/L (ref 7–232)

## 2010-06-26 LAB — URINALYSIS, ROUTINE W REFLEX MICROSCOPIC
Bilirubin Urine: NEGATIVE
Ketones, ur: NEGATIVE mg/dL
Protein, ur: NEGATIVE mg/dL
Specific Gravity, Urine: 1.003 — ABNORMAL LOW (ref 1.005–1.030)
Urobilinogen, UA: 0.2 mg/dL (ref 0.0–1.0)
pH: 7 (ref 5.0–8.0)

## 2010-06-26 LAB — POCT CARDIAC MARKERS: Troponin i, poc: <0.05 ng/mL (ref 0.00–0.09)

## 2010-06-26 LAB — BRAIN NATRIURETIC PEPTIDE
Pro B Natriuretic peptide (BNP): <30 pg/mL (ref 0.0–100.0)
Pro B Natriuretic peptide (BNP): <30 pg/mL (ref 0.0–100.0)

## 2010-06-26 LAB — TROPONIN I: Troponin I: 0.05 ng/mL (ref 0.00–0.06)

## 2010-06-30 ENCOUNTER — Ambulatory Visit (INDEPENDENT_AMBULATORY_CARE_PROVIDER_SITE_OTHER): Payer: BC Managed Care – PPO | Admitting: Internal Medicine

## 2010-06-30 ENCOUNTER — Ambulatory Visit: Payer: Self-pay | Admitting: Internal Medicine

## 2010-06-30 ENCOUNTER — Encounter: Payer: Self-pay | Admitting: Internal Medicine

## 2010-06-30 ENCOUNTER — Other Ambulatory Visit (INDEPENDENT_AMBULATORY_CARE_PROVIDER_SITE_OTHER): Payer: BC Managed Care – PPO

## 2010-06-30 DIAGNOSIS — Z79899 Other long term (current) drug therapy: Secondary | ICD-10-CM

## 2010-06-30 DIAGNOSIS — E785 Hyperlipidemia, unspecified: Secondary | ICD-10-CM

## 2010-06-30 DIAGNOSIS — I10 Essential (primary) hypertension: Secondary | ICD-10-CM

## 2010-06-30 DIAGNOSIS — M5137 Other intervertebral disc degeneration, lumbosacral region: Secondary | ICD-10-CM

## 2010-06-30 MED ORDER — PREDNISONE (PAK) 10 MG PO TABS: 10.0000 mg | ORAL_TABLET | ORAL | Status: AC

## 2010-06-30 NOTE — Patient Instructions (Signed)
It was good to see you today. Test(s) ordered today. Your results will be called to you after review (48-72hours after test completion). If any changes need to be made, you will be notified at that time. Use Pred pak x 6 days for back/hip pain flare as discussed - Your prescription(s) have been submitted to your pharmacy. Please take as directed and contact our office if you believe you are having problem(s) with the medication(s). Other medications reviewed, no other changes at this time. Follow up with Dr. Charlett Blake as needed Please schedule followup in 6 months for blood pressure and cholesterol check, call sooner if problems.

## 2010-06-30 NOTE — Assessment & Plan Note (Signed)
pred pak x 6d to use for flare - rx done -  follow up ortho as needed to repeat back steroid injection if not improved

## 2010-06-30 NOTE — Assessment & Plan Note (Signed)
Resumed statin 03/2010 - check chol and lfts now - adjust as needed

## 2010-06-30 NOTE — Assessment & Plan Note (Signed)
BP Readings from Last 3 Encounters:  06/30/10 120/82  03/30/10 128/90  08/09/09 132/90   The current medical regimen is effective;  continue present plan and medications.

## 2010-06-30 NOTE — Progress Notes (Signed)
  Subjective:    Patient ID: William Hartman, male    DOB: [birth year], 45 y.o.   MRN: [id number]  HPI Here for followup: reviewed chronic med issues today:  HTN - reports compliance with ongoing medical treatment and no changes in medication dose or frequency. denies adverse side effects related to current therapy. No CP or edema, no HA  dyslipidemia -resumed statin 03/2010 - the patient reports compliance with medication(s) as prescribed. Denies adverse side effects.  AMC (arthrogryposis multiplex congenita) - mulitple surg for same as child - stable symptoms - able to walk indep since back injections for DDD in 2010 - follows regularly with ortho for same (voytek) - still working on new WC rx for "leverage ride" to have front control access rather than manual use of arms/hands - recent flare of right leg/hip/back pain but reluctant to repeat shot series  Past Medical History  Diagnosis Date  . DDD (degenerative disc disease), lumbar   . HYPERLIPIDEMIA 06/28/2009  . ARTHROGRYPOSIS MULTIPLEX CONGENITA 06/29/2009  . SYNCOPE 06/28/2009  . CARDIOVASCULAR FUNCTION STUDY, ABNORMAL 08/09/2009  . HYPERTENSION 03/31/2010  . ARTHRITIS 03/30/2010  . ABNORMAL ELECTROCARDIOGRAM 03/31/2010  . CELLULITIS, LEG, RIGHT 06/29/2009   Review of Systems  Respiratory: Negative for cough.   Cardiovascular: Negative for chest pain.  Genitourinary: Negative for dysuria.  Neurological: Negative for syncope.      Objective:   Physical Exam  Constitutional: He appears well-nourished. No distress.       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  Exam reveals no friction rub.   No murmur heard. Pulmonary/Chest: Effort normal and breath sounds normal. No respiratory distress. He has no wheezes.   BP 120/82  Pulse 92  Temp(Src) 98.6 F (37 C) (Oral)  Ht 5\' 6"  (1.676 m)  Wt 182 lb 12.8 oz (82.918 kg)  BMI 29.50 kg/m2     Lab  Results  Component Value Date   WBC 13.9* 03/28/2010   HGB 16.0 03/28/2010   HCT 47.2 03/28/2010   PLT 308.0 03/28/2010   CHOL 220* 03/28/2010   TRIG 279.0* 03/28/2010   HDL 34.70* 03/28/2010   LDLDIRECT 153.0 03/28/2010   ALT 33 03/28/2010   AST 25 03/28/2010   NA 138 03/28/2010   K 4.8 03/28/2010   CL 105 03/28/2010   CREATININE 0.5 03/28/2010   BUN 18 03/28/2010   CO2 21 03/28/2010   TSH 1.44 03/28/2010   PSA 1.24 03/28/2010   Assessment & Plan:  See problem list. Medications and labs reviewed today.

## 2010-07-03 ENCOUNTER — Telehealth: Payer: Self-pay | Admitting: Internal Medicine

## 2010-07-03 LAB — HEPATIC FUNCTION PANEL
ALT: 56 U/L — ABNORMAL HIGH (ref 0–53)
AST: 34 U/L (ref 0–37)
Albumin: 4.5 g/dL (ref 3.5–5.2)

## 2010-07-03 LAB — LIPID PANEL
HDL: 45.8 mg/dL (ref >39.00)
Triglycerides: 108 mg/dL (ref 0.0–149.0)

## 2010-07-03 NOTE — Telephone Encounter (Signed)
Please call patient - normal results. Cholesterol much improved since resuming treatement 03/2010! Liver ok. No medication changes recommended. Thanks.  Lab Results  Component Value Date   LDLCALC 74 06/30/2010

## 2010-07-03 NOTE — Telephone Encounter (Signed)
Pt Notified with lab results...07/03/10@11 :58am/LMB

## 2010-07-17 LAB — CBC
Platelets: 378 10*3/uL (ref 150–400)
WBC: 12.9 10*3/uL — ABNORMAL HIGH (ref 4.0–10.5)

## 2010-08-10 ENCOUNTER — Other Ambulatory Visit: Payer: Self-pay | Admitting: *Deleted

## 2010-08-10 MED ORDER — SIMVASTATIN 10 MG PO TABS: 10.0000 mg | ORAL_TABLET | Freq: Every day | ORAL | Status: DC

## 2010-08-15 NOTE — Op Note (Signed)
William Hartman, William Hartman                 ACCOUNT NO.:  [id number]   MEDICAL RECORD NO.:  [id number]          PATIENT TYPE:  AMB   LOCATION:  SDS                          FACILITY:  [facility]   PHYSICIAN:  Thornton Park. Daphine Deutscher, MD  DATE OF BIRTH:  1965-04-11   DATE OF PROCEDURE:  04/28/2008  DATE OF DISCHARGE:  04/28/2008                               OPERATIVE REPORT   PREOPERATIVE DIAGNOSIS:  Right lateral knee mass.   POSTOPERATIVE DIAGNOSIS:  Right lateral knee mass.   PROCEDURE:  Excision of right lateral knee mass.   SURGEON:  Thornton Park. Daphine Deutscher, MD   ANESTHESIA:  General by LMA.   DESCRIPTION OF PROCEDURE:  Mr. Ziller is a 45 year old gentleman who had  some congenital birth injuries and had some difficulty with ambulation.  He has a tender nodule on the right lateral region that is bothersome  when he walks and when he rub a hand over.  It has gotten more tender.  It is right over the perineal nerve and I discussed excision with risk  of damage to that with him preoperatively.   He was taken to room 16 on April 28, 2008, and given general by LMA.  The knee was prepped widely and draped sterilely.  First, I infiltrated  the area with 0.5% Marcaine.  I saw no punctum and no evidence of  epidermal cyst.  I then made a small transverse incision right down over  the mass dissecting it out not cutting any structures that would even in  any way resemble a nerve and then staying right on the mass and shelling  it out from the surrounding tissue.  This was then closed with 3-0  Vicryl subcutaneously and with two 4-0 nylons.  The patient tolerated  the procedure well.  He was taken to recovery room and will be  discharged home.  Follow up in the office.      Thornton Park Daphine Deutscher, MD  Electronically Signed     MBM/MEDQ  D:  04/28/2008  T:  04/29/2008  Job:  [id number]

## 2010-08-15 NOTE — Consult Note (Signed)
NAME:  [name], [name] NO.:  [id number]   MEDICAL RECORD NO.:  [id number]          PATIENT TYPE:  INP   LOCATION:  3740                         FACILITY:  [facility]   PHYSICIAN:  Marlan Palau, M.D.  DATE OF BIRTH:  1965-04-30   DATE OF CONSULTATION:  03/30/2008  DATE OF DISCHARGE:                                 CONSULTATION   HISTORY OF PRESENT ILLNESS:  William Hartman is a 45 year old left-handed  white male born on [birth year], with history of arthrogryposis  multiplex congenita.  The patient was at work today noted sudden onset  of left upper extremity numbness.  The numbness went from the left  shoulder to midway down the left upper arm and did not get down to the  elbow.  The onset was painless, unassociated with weakness, speech  changes, visual changes, headache, dizziness.  The patient has had  persistence of symptoms, but the symptoms have improved a bit.  The CT  scan of the brain done through the emergency room was unremarkable.  Neurology was asked to see the patient for further evaluation.  The  patient claims that he had a transient event similar to this on the left  arm 2 days prior to this evaluation.  The event again lasted only 2-3  minutes and resolved completely.  The patient did not seek medical  attention at that time.   PAST MEDICAL HISTORY:  1. Left arm numbness, rule out stroke or TIA.  2. Arthrogryposis multiplex congenita.  3. Right knee surgery patellar injury.   The patient is on Celebrex 200 mg daily.  Does not smoke, drinks alcohol  on occasion, has no known allergies.   SOCIAL HISTORY:  The patient is married, lives in the [place] area.  Does work, has no children.   FAMILY MEDICAL HISTORY:  Biological father is living.  His whereabouts  and health were unknown.  Mother died with cancer.  The patient has one  brother who is alive and well.   REVIEW OF SYSTEMS:  Notable for no recent fevers, chills.  The  patient  denies headache, vision changes, swallowing problems, or speech  problems.  Denies any neck pain, shoulder, or arm pain.  Denies any  problems with shortness of breath, chest pains, abdominal pain, nausea,  or vomiting.  Denies any problems controlling the bowels or bladder.  Denies any changes in balance, dizziness, or blackout episodes.   PHYSICAL EXAMINATION:  VITAL SIGNS:  Blood pressure 150/106, heart rate  76, respiratory rate 12, temperature 99.5 orally.  GENERAL:  This patient is a fairly well-developed white male who is  alert and cooperative at time of examination.  HEENT:  Head, atraumatic.  Eyes, pupils are equal round, and reactive to  light.  Discs are flat bilaterally.  NECK:  Supple.  No carotid bruits noted.  RESPIRATORY:  Clear.  CARDIOVASCULAR:  Regular rate and rhythm.  No obvious murmurs or rubs  are noted.  EXTREMITIES:  1 to 2+ edema around the ankles bilaterally.  The patient  has shortened distal arms,  legs, and malformation of the wrist and the  ankles bilaterally.  Club feet noted bilaterally.  NEUROLOGIC:  Cranial nerves as above.  Facial symmetry is present.  The  patient has good sensation of the face to pinprick, soft touch  bilaterally.  He has good strength of the facial muscle, muscles to head  turn and shoulder shrug bilaterally.  Speech is well enunciated, not  aphasic.  Pinprick, sensation of the face is symmetric and normal, again  extraocular movements are normal and visual fields are normal.  Motor  testing reveals weakness with grip bilaterally.  The patient has some  decreased ability to elevate the arms at the shoulders bilaterally right  greater than left, has good flexion-extension of the elbow bilaterally.  Good strength noted bilaterally in the proximal lower extremities  weakness with dorsi and plantar flexion of the feet are noted on both  sides.  Pinprick, soft touch, vibratory sensation are intact throughout.  No evidence of  extinction is noted.  No drift with the upper or lower  extremities noted.  The patient has fair finger-nose-finger and toe-to-  finger bilaterally.  Deep tendon reflexes are symmetric, depressed  without ankle jerks bilaterally.  Toes are neutral bilaterally.   Laboratory values are pending at this time and CT of the head is as  above.   IMPRESSION:  Onset of left arm numbness, etiology unclear.   This patient has a small area of numbness in the upper arm from the  shoulder to the mid humeral area.  The area of sensory alteration is  very small and is improving over time.  This was painless in onset and  is gradually resolving, could potentially represent a very small stroke  or TIA-type event.  The etiology of this may never be ascertained.   PLAN:  1. Obtain MRI of the brain.  2. MRI angiogram of the intracranial vessels with the above studies      were unremarkable.  Would not pursue any further workup.  3. We will follow the patient's clinical course while in-house.      Marlan Palau, M.D.  Electronically Signed     CKW/MEDQ  D:  03/30/2008  T:  03/31/2008  Job:  [id number]   cc:   Madolyn Frieze. Jens Som, MD, [facility]  [facility]

## 2010-08-15 NOTE — Assessment & Plan Note (Signed)
[facility]                            CARDIOLOGY OFFICE NOTE   William Hartman, STRENGTH                        MRN:          [id number]  DATE:04/08/2008                            DOB:          Dec 06, 1965    William Hartman is a very pleasant gentleman, who was recently brought to  [facility] Emergency Room with left upper extremity numbness.  He was  thought to be potentially having an acute infarct, but his  electrocardiogram was not consistent with this.  He did rule out for  myocardial infarction serial enzymes.  He did have a CT of his head due  to the numbness in his arm.  There was no abnormality.  MRA showed no  acute infarct.  There was mild to slightly moderate nonspecific white  matter type changes noted.  There was a small basilar artery secondary  to fetal origin of the posterior cerebral arteries.  There was mild  branch vessel with irregularities.  He also had an x-ray of his lower  extremity that showed no osteomyelitis.  An echocardiogram was also  performed, this was on March 31, 2008.  It was technically difficult.  This showed an ejection fraction of 55% with an atrial septal aneurysm.  Finally, he was scheduled for an outpatient Myoview.  This was performed  on April 06, 2008.  This showed an ejection fraction of 75%.  There was  a prior lateral infarct versus soft tissue attenuation (the patient was  imaged with his arms down).  There was also a question of very mild  lateral ischemia.  He is felt to be at low risk.  Note, he also had a  repeat white blood cell count as his white blood cell count was mildly  elevated in the hospital.  On April 06, 2008, his white blood cell  count was 10.  Since I saw him, he denies any dyspnea, chest pain,  palpitations, or syncope.  He had a brief episode of numbness in his  left upper extremity with no other neurologic symptoms.  He does  occasionally have some pain in his lower extremities as well.   His  medications includes Celebrex daily and an antibiotic for possible  cellulitis of his lower extremity.   PHYSICAL EXAMINATION:  VITAL SIGNS:  Blood pressure of 150/100 and his  pulse is 94.  He weighs 187 pounds.  HEENT:  Normal.  NECK:  Supple.  CHEST:  Clear.  CARDIOVASCULAR:  Regular rate.  ABDOMEN:  No tenderness.  EXTREMITIES:  Abnormal due to congenital problem.  There is a non-  healing ulcer on the right lower extremity.  His distal pulses are  diminished.   DIAGNOSES:  1. Abnormal nuclear study - his nuclear study suggests possibility of      a small prior lateral infarct, but his images were obtained with      his arms down and this certainly could be a soft tissue      attenuation.  There was also a question of mild lateral ischemia.      I think this  is a low-risk study and we will treat him medically.      We will ask him to take enteric-coated aspirin 81 mg p.o. daily.      He will also begin on his statin given his elevated LDL.  We will      check lipids and liver in 6 weeks and adjust as indicated.  2. Numbness in his left upper extremity - I have asked him to follow      up with Dr. Anne Hahn, Neurology for this issue.  3. Hyperlipidemia - as above we are beginning Zocor 40 mg p.o. daily.  4. A question of mild cellulitis of the right lower extremity - I have      asked him to follow up with his primary care physician concerning      this issue.  We will check ABIs with Dopplers as his distal pulses      are diminished.  5. Mildly elevated white blood cell count - this has resolved on his      most recent check.  6. Elevated blood pressure - I have asked him to track this at home.      If it remains elevated, then we will add additional medications as      indicated.   I will see him back in approximately 6 months.  We will plan to repeat  his Myoview approximately in 1 year if his symptoms remain stable.     [name], MD, [facility]  Electronically  Signed    BSC/MedQ  DD: 04/08/2008  DT: 04/09/2008  Job #: [id number]

## 2010-08-15 NOTE — Procedures (Signed)
DUPLEX DEEP VENOUS EXAM - LOWER EXTREMITY   INDICATION:  Right lower extremity swelling for approximately three  weeks at night.   HISTORY:  Edema:  Right lower extremity.  Trauma/Surgery:  Right calf bone-related surgery when child.  Pain:  No.  PE:  No.  Previous DVT:  No.  Anticoagulants:  No.  Other:   DUPLEX EXAM:                CFV   SFV   PopV  PTV    GSV                R  L  R  L  R  L  R   L  R  L  Thrombosis    o  o  o     o     o      o  Spontaneous   +  +  +     +     +      +  Phasic        +  +  +     +     +      +  Augmentation  +  +  +     +     +      +  Compressible  +  +  +     +     +      +  Competent     +  +  +     +     +      +   Legend:  + - yes  o - no  p - partial  D - decreased   IMPRESSION:  No evidence of deep venous thrombosis or superficial  thrombophlebitis in the right lower extremity or the left common femoral  vein.   NOTE:  Patient had difficulty positioning properly for optimum  visualization.   INCIDENTAL FINDING:  Evidence of nonvascularized mass on posterior-  lateral aspect of right lower extremity near the knee.    _____________________________  P. Liliane Bade, M.D.   AS/MEDQ  D:  06/17/2007  T:  06/17/2007  Job:  [id number]

## 2010-08-15 NOTE — H&P (Signed)
William Hartman, FIER NO.:  [id number]   MEDICAL RECORD NO.:  [id number]          PATIENT TYPE:  INP   LOCATION:  3740                         FACILITY:  [facility]   PHYSICIAN:  Madolyn Frieze. Jens Som, MD, FACCDATE OF BIRTH:  12-14-1965   DATE OF ADMISSION:  03/30/2008  DATE OF DISCHARGE:                              HISTORY & PHYSICAL   PRIMARY CARDIOLOGIST:  Madolyn Frieze. Jens Som, MD, [facility]   PRIMARY CARE PHYSICIAN:  The patient does not have one.   HISTORY OF PRESENT ILLNESS:  This is a 45 year old Caucasian male  without prior cardiac history with complaints of left arm numbness  around 10 a.m. 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  The  patient continued to have this left arm numbness for greater than 1  hour.  He called his brother who is an EMT who advised Urgent Care  visit.  The patient did go to Urgent Care and had an EKG that they  diagnosed as an ST elevated MI and the patient was brought emergently to  [facility].  Upon arrival, the patient's EKG was re-evaluated by Dr.  Olga Millers and found that the patient had a right bundle-branch  block.  The patient had been pain free without any complaints of chest  pain, shortness of breath, nausea, vomiting, diaphoresis, dizziness.  His only complaint was some left arm pain.  The patient was diverted  from Cardiac Catheterization Lab and seen in the emergency room.  The  patient complains of some mild tingling in his left arm but not as it  had been prior to admission.  The patient is currently without complaint  of chest pain as well.   REVIEW OF SYSTEMS:  Positive for left arm pain, otherwise, negative for  chest pain, dyspnea, diaphoresis, or nausea.   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   SOCIAL HISTORY:  He lives in [place] with his wife.  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  He does not smoke.  Occasional use of  alcohol.  No  illicit drug use.   FAMILY HISTORY:  The patient has no family history of CAD, diabetes,  CVA.  He only knows his mother's side.   CURRENT MEDICATIONS AT HOME:  Celebrex.   ALLERGIES:  None.   Labs are pending.   PHYSICAL EXAMINATION:  GENERAL:  He is awake, alert, and oriented  without acute distress.  HEENT:  Head is normocephalic and atraumatic.  Eyes, PERRLA.  Mucous  membranes, mouth pink and moist.  Tongue is midline. NECK:  Supple with  no JVD.  No carotid bruits appreciated.  CARDIOVASCULAR:  Regular rate and rhythm with 1/6 systolic murmur  auscultated without murmurs, rubs, or gallops.  Pulses are 2+ and equal  without bruits.  LUNGS:  Clear to auscultation.  ABDOMEN:  Soft, nontender, 2+ bowel sounds.  EXTREMITIES:  There is some extremity deformity noted in the arms and  legs, some shortening and also deformity at the wrist. NEURO:  The  patient is still complaining of some mild left  arm numbness.   IMPRESSION:  1. Acute onset of left arm numbness.  2. History of arthrogryposis multiplex congeniti.   PLAN:  The patient was seen and examined in the emergency room by myself  and Dr. Olga Millers.  The patient was brought to the emergency room  at the advisement of Urgent Care after left arm numbness began.  The  patient had a prolonged episode today with no associated diaphoresis,  dizziness, nausea, vomiting, or chest pain.  EKG showed normal sinus  rhythm, first-degree AV block with a right bundle-branch block mild with  inferior T-wave inversion.  EKG is not consistent with acute MI and  symptoms also are atypical.  We will admit the patient to rule out  myocardial infarction by cycling enzymes.  We will check echocardiogram.  If negative, we would recommend outpatient stress Myoview.  In the  interim, we will do a CT scan of the head to rule out TIA and we will  also do a neuro consult in this setting making further recommendations.  If all is negative, the  patient will be released home in the a.m. for  outpatient followup.      Bettey Mare. Lyman Bishop, NP      Madolyn Frieze. Jens Som, MD, [facility]  Electronically Signed    KML/MEDQ  D:  03/30/2008  T:  03/31/2008  Job:  [id number]

## 2010-08-15 NOTE — Discharge Summary (Signed)
NAME:  William Hartman, William Hartman NO.:  [id number]   MEDICAL RECORD NO.:  [id number]          PATIENT TYPE:  INP   LOCATION:  3740                         FACILITY:  [facility]   PHYSICIAN:  Madolyn Frieze. Jens Som, MD, FACCDATE OF BIRTH:  09-17-1965   DATE OF ADMISSION:  03/30/2008  DATE OF DISCHARGE:  03/31/2008                               DISCHARGE SUMMARY   PRIMARY CARDIOLOGIST:  Madolyn Frieze. Jens Som, MD, FACC   NEUROLOGY:  Marlan Palau, MD   PRIMARY CARE PHYSICIAN:  Sandria Bales. Alwyn Ren, MD, at [facility]  [facility].   DISCHARGING DIAGNOSES:  1. Abnormal EKG.  The patient with EKG showing normal sinus rhythm      with a first-degree atrioventricular block, right bundle-branch      block with mild inferior T-wave inversions that has remained      consistent and unchanged throughout hospitalization.  The patient      ruled out for myocardial infarction by cardiac serial enzymes.  A 2-      D echo also performed at this hospitalization showing ejection      fraction of 55%.  Overall, left ventricular systolic function      normal.  Study inadequate for the evaluation of left ventricular      regional wall motion.  There was an atrial septal aneurysm noted.  2. Numbness from left shoulder to mid way down the left upper arm and      associated with weakness, speech changes, visual changes,      headaches, or dizziness.  The patient evaluated by Dr. Anne Hahn this      admission.  CT scan of the brain unremarkable.  MRI of the head      showing no acute infarct, mild-to-slightly moderate nonspecific      white matter-type changes.  Small basilar artery secondary to fetal      origin of the posterior cerebral arteries, mild branch vessel      irregularity suggestive of atherosclerotic-type changes.  3. Hypercholesteremia with initiation of patient education regarding      low-fat and heart-healthy diet.  4. History of arthrogryposis multiplex congenita.  5. Questionable contact  dermatitis to right lower extremity status      post x-ray of that area this admission to rule out osteomyelitis.      X-ray showing soft tissue swelling without evidence of acute bony      abnormality or evidence of osteomyelitis.  6. Mild elevation in white blood cell count 13.3.  Followup CBC      outpatient at the time of stress Myoview.   CONSULTANTS THIS ADMISSION:  Marlan Palau, MD, with Neurology and  wound care consult.  Wound care consult for lower extremity excoriated  area, questionable dermatitis with recommendations for hydrocortisone  topical at this time, follow up with primary care.   HOSPITAL COURSE:  Mr. Deruiter is a 45 year old Caucasian gentleman with  past medical history as stated above who on the day of admission was at  work noted sudden onset of left upper extremity numbness.  Numbness went  from his left  shoulder to mid way down the left upper arm, not  associated with any other neurological changes.  Symptoms persisted.  The patient presented to Urgent Care for further evaluation.  At Urgent  Care, EKG was done that was diagnosed as a ST-elevated MI and the  patient was brought emergently to [facility].  Upon arrival, the  patient's EKG was reevaluated by Dr. Olga Millers and noted that the  patient had a right bundle-branch block.  Code STEMI was cancelled.  The  patient was diverted from cardiac catheterization lab and seen in the  emergency room.  Left arm pain as described above.  CT of the head ruled  out any acute findings/bleed.  The patient admitted for observation in  the setting of abnormal EKG and left arm numbness as noted.  The patient  ruled out for myocardial infarction by cardiac serial enzymes following  overnight stay.  Repeat EKG without any change.  Dr. Pearlean Brownie in to see the  patient on the day of discharge.  Head MRI results as stated above.  Stroke/TIA, unclear etiology, however, doubt stroke.  If recurrent  symptoms return, the  patient is to follow up outpatient with Dr. Anne Hahn  per Dr. Pearlean Brownie.  The patient also with lower extremity excoriated area to  right lower extremity, warm to touch but without any drainage.  X-ray of  tibia-fibula obtained to rule out osteomyelitis, results as stated  above.  The patient is prophylactically being treated with Keflex for 7  days.  The patient noted to have elevated total cholesterol of 228,  triglyceride of 139, HDL of 33, and LDL of 167.  Given information  regarding a heart-healthy, low-fat, low-cholesterol diet.  Test results  reviewed with Dr. Jens Som.  The patient is stable to be discharged  home.  Followup outpatient stress Myoview scheduled for April 06, 2008,  at 10:00 a.m. and then follow up with Dr. Jens Som on April 29, 2008,  at 11:15 a.m.  The patient is to follow up with his primary care  physicians at [facility] for further evaluation of  lower extremity lesion.  The patient will need a repeat CBC drawn on  April 06, 2008, at 10:00 a.m. when he presents for his stress Myoview  in regards to his elevated white blood cell count.   The patient's medications at the time of discharge include:  1. Celebrex 200 mg daily as previously prescribed.  2. Keflex 500 mg p.o. b.i.d. x7 days, prescription provided.   The patient is also being given status post stroke/TIA discharge  instructions as instructed by Dr. Pearlean Brownie.   DURATION OF DISCHARGE ENCOUNTER:  Well over 30 minutes.      Dorian Pod, ACNP      Madolyn Frieze. Jens Som, MD, [facility]  Electronically Signed    MB/MEDQ  D:  03/31/2008  T:  04/01/2008  Job:  [id number]

## 2010-12-28 ENCOUNTER — Ambulatory Visit (INDEPENDENT_AMBULATORY_CARE_PROVIDER_SITE_OTHER): Payer: BC Managed Care – PPO | Admitting: Internal Medicine

## 2010-12-28 ENCOUNTER — Encounter: Payer: Self-pay | Admitting: Internal Medicine

## 2010-12-28 ENCOUNTER — Other Ambulatory Visit (INDEPENDENT_AMBULATORY_CARE_PROVIDER_SITE_OTHER): Payer: BC Managed Care – PPO

## 2010-12-28 DIAGNOSIS — E785 Hyperlipidemia, unspecified: Secondary | ICD-10-CM

## 2010-12-28 DIAGNOSIS — R42 Dizziness and giddiness: Secondary | ICD-10-CM

## 2010-12-28 DIAGNOSIS — I10 Essential (primary) hypertension: Secondary | ICD-10-CM

## 2010-12-28 LAB — CBC WITH DIFFERENTIAL/PLATELET
Basophils Absolute: 0.1 10*3/uL (ref 0.0–0.1)
Hemoglobin: 16.6 g/dL (ref 13.0–17.0)
Lymphocytes Relative: 25.9 % (ref 12.0–46.0)
Monocytes Relative: 6.7 % (ref 3.0–12.0)
Neutro Abs: 7.9 10*3/uL — ABNORMAL HIGH (ref 1.4–7.7)
Neutrophils Relative %: 65.2 % (ref 43.0–77.0)
RBC: 5.26 Mil/uL (ref 4.22–5.81)
RDW: 13.6 % (ref 11.5–14.6)

## 2010-12-28 NOTE — Assessment & Plan Note (Signed)
BP Readings from Last 3 Encounters:  12/28/10 128/98  06/30/10 120/82  03/30/10 128/90   The current medical regimen is effective;  continue present plan and medications.

## 2010-12-28 NOTE — Assessment & Plan Note (Signed)
On statin since 03/2010 - tolerating well Recheck now

## 2010-12-28 NOTE — Progress Notes (Signed)
Subjective:    Patient ID: [facility], male    DOB: Dec 04, 1965, 45 y.o.   MRN: [id number]  HPI  Here for followup - reviewed chronic med issues today:  HTN - reports compliance with ongoing medical treatment and no changes in medication dose or frequency. denies adverse side effects related to current therapy. No CP or edema, no HA  dyslipidemia -resumed statin 03/2010 - the patient reports compliance with medication(s) as prescribed. Denies adverse side effects.  AMC (arthrogryposis multiplex congenita) - mulitple surg for same as child - stable symptoms - able to walk indep since back injections for DDD in 2010 - follows regularly with ortho for same (voytek) - still working on new WC rx for "leverage ride" to have front control access rather than manual use of arms/hands - recent flare of right leg/hip/back pain but reluctant to repeat shot series  Past Medical History  Diagnosis Date  . DDD (degenerative disc disease), lumbar   . HYPERLIPIDEMIA   . ARTHROGRYPOSIS MULTIPLEX CONGENITA   . SYNCOPE   . HYPERTENSION   . ARTHRITIS    Review of Systems  Respiratory: Negative for cough.   Cardiovascular: Negative for chest pain.  Genitourinary: Negative for dysuria.  Neurological: Positive for dizziness. Negative for syncope.      Objective:   Physical Exam BP 128/98  Pulse 74  Temp(Src) 98.4 F (36.9 C) (Oral)  Ht 5\' 5"  (1.651 m)  Wt 189 lb (85.73 kg)  BMI 31.45 kg/m2  SpO2 96% Wt Readings from Last 3 Encounters:  12/28/10 189 lb (85.73 kg)  06/30/10 182 lb 12.8 oz (82.918 kg)  03/30/10 187 lb (84.823 kg)   Constitutional: He appears well-nourished. No distress. 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 No murmur or rub heard. No BLE edema Pulmonary/Chest: Effort normal and breath sounds normal. No respiratory distress. He has no wheezes.  Neurological: he is alert and oriented to person,  place, and time. No cranial nerve deficit. Balance and coordination normal/baseline - Gait affected by MSKel shortening (chronic). strength 5/5 Psychiatric: he has a normal mood and affect. behavior is normal. Judgment and thought content normal.       Lab Results  Component Value Date   WBC 13.9* 03/28/2010   HGB 16.0 03/28/2010   HCT 47.2 03/28/2010   PLT 308.0 03/28/2010   CHOL 141 06/30/2010   TRIG 108.0 06/30/2010   HDL 45.80 06/30/2010   LDLDIRECT 153.0 03/28/2010   ALT 56* 06/30/2010   AST 34 06/30/2010   NA 138 03/28/2010   K 4.8 03/28/2010   CL 105 03/28/2010   CREATININE 0.5 03/28/2010   BUN 18 03/28/2010   CO2 21 03/28/2010   TSH 1.44 03/28/2010   PSA 1.24 03/28/2010   Assessment & Plan:  See problem list. Medications and labs reviewed today.  Dizziness - one event, mild 4 weeks ago, no recurrent spells since, no syncope with this event but hx same - history of micturition syncope; echo March 2011 EF 50- 55% with severe LVH and mild left atrial enlargement. A monitor showed sinus rhythm with first degree AV block, PVCs and couplets. A Myoview performed on April 06, 2008 for abnormal electrocardiogram showed an ejection fraction of 75%. There was a prior lateral infarct versus soft tissue attenuation (the patient was imaged with his arms down). There was also a question of very mild lateral ischemia; felt to be at low risk. Consider re-eval by  cards if recurrent events - HD stable and exam benign today - check labs

## 2010-12-28 NOTE — Patient Instructions (Addendum)
It was good to see you today. Test(s) ordered today. Your results will be called to you after review (48-72hours after test completion). If any changes need to be made, you will be notified at that time. Medications reviewed, no changes at this time. Please schedule followup in 6 months for cholesterol and blood pressure monitoring, call sooner if problems. If recurrent dizzy spells, let us know or call Dr. Jens Som for other evaluation as needed

## 2010-12-29 LAB — HEPATIC FUNCTION PANEL
ALT: 43 U/L (ref 0–53)
AST: 35 U/L (ref 0–37)
Albumin: 4.6 g/dL (ref 3.5–5.2)

## 2010-12-29 LAB — BASIC METABOLIC PANEL
Calcium: 9.6 mg/dL (ref 8.4–10.5)
GFR: 205.99 mL/min (ref >60.00)
Glucose, Bld: 80 mg/dL (ref 70–99)
Sodium: 142 mEq/L (ref 135–145)

## 2010-12-29 LAB — LIPID PANEL
Cholesterol: 162 mg/dL (ref 0–200)
HDL: 46.1 mg/dL (ref >39.00)
VLDL: 28.8 mg/dL (ref 0.0–40.0)

## 2011-01-05 LAB — CARDIAC PANEL(CRET KIN+CKTOT+MB+TROPI)
CK, MB: 4.1 ng/mL — ABNORMAL HIGH (ref 0.3–4.0)
CK, MB: 4.5 ng/mL — ABNORMAL HIGH (ref 0.3–4.0)
Relative Index: 2 (ref 0.0–2.5)
Relative Index: 2.2 (ref 0.0–2.5)
Total CK: 185 U/L (ref 7–232)
Total CK: 230 U/L (ref 7–232)

## 2011-01-05 LAB — COMPREHENSIVE METABOLIC PANEL
ALT: 42 U/L (ref 0–53)
AST: 37 U/L (ref 0–37)
Albumin: 4.3 g/dL (ref 3.5–5.2)
Alkaline Phosphatase: 82 U/L (ref 39–117)
Calcium: 9.9 mg/dL (ref 8.4–10.5)
GFR calc Af Amer: >60 mL/min (ref >60)
Potassium: 3.8 mEq/L (ref 3.5–5.1)
Sodium: 138 mEq/L (ref 135–145)
Total Protein: 8.3 g/dL (ref 6.0–8.3)

## 2011-01-05 LAB — CBC
HCT: 47.3 % (ref 39.0–52.0)
Hemoglobin: 16 g/dL (ref 13.0–17.0)
MCHC: 33.7 g/dL (ref 30.0–36.0)
MCV: 92.4 fL (ref 78.0–100.0)
Platelets: 372 K/uL (ref 150–400)
RBC: 5.12 MIL/uL (ref 4.22–5.81)
RDW: 14 % (ref 11.5–15.5)
WBC: 13.3 10*3/uL — ABNORMAL HIGH (ref 4.0–10.5)

## 2011-01-05 LAB — COMPREHENSIVE METABOLIC PANEL WITH GFR
BUN: 10 mg/dL (ref 6–23)
CO2: 19 meq/L (ref 19–32)
Chloride: 104 meq/L (ref 96–112)
Creatinine, Ser: 0.41 mg/dL (ref 0.4–1.5)
GFR calc non Af Amer: >60 mL/min (ref >60)
Glucose, Bld: 93 mg/dL (ref 70–99)
Total Bilirubin: 0.7 mg/dL (ref 0.3–1.2)

## 2011-01-05 LAB — CK TOTAL AND CKMB (NOT AT ARMC)
CK, MB: 6.2 ng/mL — ABNORMAL HIGH (ref 0.3–4.0)
Relative Index: 2.2 (ref 0.0–2.5)
Total CK: 279 U/L — ABNORMAL HIGH (ref 7–232)

## 2011-01-05 LAB — TROPONIN I: Troponin I: 0.01 ng/mL (ref 0.00–0.06)

## 2011-01-05 LAB — LIPID PANEL
Cholesterol: 228 mg/dL — ABNORMAL HIGH (ref 0–200)
LDL Cholesterol: 167 mg/dL — ABNORMAL HIGH (ref 0–99)

## 2011-01-05 LAB — TSH: TSH: 1.143 u[IU]/mL (ref 0.350–4.500)

## 2011-01-25 ENCOUNTER — Emergency Department (HOSPITAL_COMMUNITY)
Admission: EM | Admit: 2011-01-25 | Discharge: 2011-01-25 | Disposition: A | Discharge status: Home / Self Care | Payer: BC Managed Care – PPO | Attending: Emergency Medicine | Admitting: Emergency Medicine

## 2011-01-25 DIAGNOSIS — R42 Dizziness and giddiness: Secondary | ICD-10-CM | POA: Insufficient documentation

## 2011-01-25 DIAGNOSIS — Q688 Other specified congenital musculoskeletal deformities: Secondary | ICD-10-CM | POA: Insufficient documentation

## 2011-01-25 DIAGNOSIS — E785 Hyperlipidemia, unspecified: Secondary | ICD-10-CM | POA: Insufficient documentation

## 2011-01-25 DIAGNOSIS — R55 Syncope and collapse: Secondary | ICD-10-CM | POA: Insufficient documentation

## 2011-01-25 DIAGNOSIS — I10 Essential (primary) hypertension: Secondary | ICD-10-CM | POA: Insufficient documentation

## 2011-01-25 LAB — URINALYSIS, ROUTINE W REFLEX MICROSCOPIC
Bilirubin Urine: NEGATIVE
Glucose, UA: NEGATIVE mg/dL
Hgb urine dipstick: NEGATIVE
Specific Gravity, Urine: 1.007 (ref 1.005–1.030)
Urobilinogen, UA: 0.2 mg/dL (ref 0.0–1.0)

## 2011-01-25 LAB — BASIC METABOLIC PANEL
BUN: 20 mg/dL (ref 6–23)
GFR calc non Af Amer: >90 mL/min (ref >90)
Glucose, Bld: 99 mg/dL (ref 70–99)
Potassium: 4.4 mEq/L (ref 3.5–5.1)

## 2011-01-25 LAB — DIFFERENTIAL
Lymphocytes Relative: 14 % (ref 12–46)
Monocytes Absolute: 0.6 10*3/uL (ref 0.1–1.0)
Monocytes Relative: 3 % (ref 3–12)
Neutro Abs: 13.8 10*3/uL — ABNORMAL HIGH (ref 1.7–7.7)

## 2011-01-25 LAB — CBC
HCT: 46.1 % (ref 39.0–52.0)
Hemoglobin: 16.3 g/dL (ref 13.0–17.0)
MCH: 32.3 pg (ref 26.0–34.0)
MCHC: 35.4 g/dL (ref 30.0–36.0)

## 2011-01-26 ENCOUNTER — Telehealth: Payer: Self-pay | Admitting: Cardiology

## 2011-01-26 NOTE — Telephone Encounter (Signed)
Spoke with pt, he had an episode yesterday where he got dizzy after being up and moving around. He has not had any dizziness when standing. He is not able to check his bp at home but reports it was fine when checked yesterday at the ER. He denies SOB, palpitations or any symptoms prior to the dizziness. He states he did not go out completely. He was given losartan from a different manufactorer but that is the only thing that is different. Pt has not taken his losartan today and feels fine. He is unable to cut the tablets in 1/2 due to their small size. Will forward for dr Jens Som review Deliah Goody

## 2011-01-26 NOTE — Telephone Encounter (Signed)
New message:  Pt had pre syncope yesterday and spent the day in the ED.  He thinks it may be some of his medications   Please give him a call regarding same.  Feels fine today.

## 2011-01-27 NOTE — Telephone Encounter (Signed)
Continue present med; schedule fu ov (not emergent); patient has not been seen since 5/11. William [facility]man

## 2011-01-29 NOTE — Telephone Encounter (Signed)
Spoke with pt, office visit scheduled William [facility]man

## 2011-01-31 ENCOUNTER — Ambulatory Visit (INDEPENDENT_AMBULATORY_CARE_PROVIDER_SITE_OTHER): Payer: BC Managed Care – PPO | Admitting: Cardiology

## 2011-01-31 ENCOUNTER — Encounter: Payer: Self-pay | Admitting: Cardiology

## 2011-01-31 DIAGNOSIS — I10 Essential (primary) hypertension: Secondary | ICD-10-CM

## 2011-01-31 DIAGNOSIS — D72829 Elevated white blood cell count, unspecified: Secondary | ICD-10-CM

## 2011-01-31 DIAGNOSIS — R55 Syncope and collapse: Secondary | ICD-10-CM

## 2011-01-31 DIAGNOSIS — E785 Hyperlipidemia, unspecified: Secondary | ICD-10-CM

## 2011-01-31 NOTE — Assessment & Plan Note (Addendum)
Continue statin. Monitored by primary care. 

## 2011-01-31 NOTE — Progress Notes (Signed)
HPI:William Hartman is a very pleasant gentleman with a history of micturition syncope.   An echocardiogram in March of 2011 revealed  an ejection fraction of 50- 55% with severe LVH and mild left atrial enlargement.  A monitor showed sinus rhythm with first degree AV block, PVCs and couplets. A Myoview performed on April 06, 2008 for  left upper extremity numbness and an abnormal electrocardiogram showed an ejection fraction of 75%.  There was a prior lateral infarct versus soft tissue attenuation (the patient was imaged with his arms down).  There was also a question of very mild lateral ischemia.  He is felt to be at low risk.  I last saw him in May of 2011. Since then, over the last 2 weeks the patient has had "dizzy spells". The first occurred at work. He was in the break room and walked to the bathroom. When he arrived he developed sudden dizziness. There was no associated chest pain, palpitations, dyspnea or nausea. There was no syncope. He sat down and felt better. He had a second episode later that day with similar circumstances. The patient was seen at [facility] emergency room. Evaluation unremarkable with the exception of a mildly elevated white blood cell count. He discontinued his losartan. He had a less severe episode on Monday. Each episode occurred soon after standing. Otherwise no exertional chest pain or dyspnea on exertion.  Current Outpatient Prescriptions  Medication Sig Dispense Refill  . celecoxib (CELEBREX) 200 MG capsule Take 200 mg by mouth daily.        . Cyanocobalamin (VITAMIN B-12) 2500 MCG SUBL Place 1 tablet under the tongue daily.        Marland Kitchen losartan (COZAAR) 50 MG tablet Take 50 mg by mouth daily.        . simvastatin (ZOCOR) 10 MG tablet Take 1 tablet (10 mg total) by mouth at bedtime.  30 tablet  11     Past Medical History  Diagnosis Date  . DDD (degenerative disc disease), lumbar   . HYPERLIPIDEMIA   . ARTHROGRYPOSIS MULTIPLEX CONGENITA   . SYNCOPE    micturition-related 2010, s/p cards eval  . HYPERTENSION   . ARTHRITIS     Past Surgical History  Procedure Date  . Right knee surgery     lateral knee mass excision  . Arthrogryposis multiplex congentia     with multiple foot/ankle surgers as a child    History   Social History  . Marital Status: Married    Spouse Name: N/A    Number of Children: N/A  . Years of Education: N/A   Occupational History  . Not on file.   Social History Main Topics  . Smoking status: Never Smoker   . Smokeless tobacco: Not on file  . Alcohol Use: Not on file  . Drug Use: Not on file  . Sexually Active: Not on file     He lives in [place] with his wife. 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 He has limited mobility, but is able to walk for routine things if needs to be up for long while, uses a wheelchair or scooter   Other Topics Concern  . Not on file   Social History Narrative  . No narrative on file    ROS: no fevers or chills, productive cough, hemoptysis, dysphasia, odynophagia, melena, hematochezia, dysuria, hematuria, rash, seizure activity, orthopnea, PND, pedal edema, claudication. Remaining systems are negative.  Physical Exam: Well-developed well-nourished in no acute  distress.  Skin is warm and dry.  HEENT is normal.  Neck is supple. No thyromegaly.  Chest is clear to auscultation with normal expansion.  Cardiovascular exam is regular rate and rhythm.  Abdominal exam nontender or distended. No masses palpated. Extremities show congenital abnormalities neuro grossly intact  ECG NSR, first degree AV Block, RBBB, LPFB, CRO prior inferior MI

## 2011-01-31 NOTE — Assessment & Plan Note (Addendum)
Noted on recent CBC and also in the past. Followup primary care; may need heme eval.

## 2011-01-31 NOTE — Assessment & Plan Note (Signed)
Given dizziness with standing will continue off of blood pressure medications.

## 2011-01-31 NOTE — Assessment & Plan Note (Addendum)
Etiology unclear but it sounds orthostatic mediated. Orthostatic by pulse in office today. Will continue off of losartan. Increase fluid intake. Check echocardiogram. Note significant conduction abnormalities on electrocardiogram. Scheduled cardiac. Followup 8 weeks.

## 2011-01-31 NOTE — Patient Instructions (Signed)
Your physician recommends that you schedule a follow-up appointment in: 8 WEEKS  Your physician has requested that you have an echocardiogram. Echocardiography is a painless test that uses sound waves to create images of your heart. It provides your doctor with information about the size and shape of your heart and how well your heart's chambers and valves are working. This procedure takes approximately one hour. There are no restrictions for this procedure.   Your physician has recommended that you wear an event monitor. Event monitors are medical devices that record the heart's electrical activity. Doctors most often [place] these monitors to diagnose arrhythmias. Arrhythmias are problems with the speed or rhythm of the heartbeat. The monitor is a small, portable device. You can wear one while you do your normal daily activities. This is usually used to diagnose what is causing palpitations/syncope (passing out).

## 2011-02-09 ENCOUNTER — Ambulatory Visit (HOSPITAL_COMMUNITY): Payer: BC Managed Care – PPO | Attending: Cardiology | Admitting: Radiology

## 2011-02-09 ENCOUNTER — Encounter (INDEPENDENT_AMBULATORY_CARE_PROVIDER_SITE_OTHER): Payer: BC Managed Care – PPO

## 2011-02-09 DIAGNOSIS — E785 Hyperlipidemia, unspecified: Secondary | ICD-10-CM | POA: Insufficient documentation

## 2011-02-09 DIAGNOSIS — D72829 Elevated white blood cell count, unspecified: Secondary | ICD-10-CM

## 2011-02-09 DIAGNOSIS — I10 Essential (primary) hypertension: Secondary | ICD-10-CM

## 2011-02-09 DIAGNOSIS — R55 Syncope and collapse: Secondary | ICD-10-CM | POA: Insufficient documentation

## 2011-02-09 DIAGNOSIS — R42 Dizziness and giddiness: Secondary | ICD-10-CM | POA: Insufficient documentation

## 2011-02-12 ENCOUNTER — Ambulatory Visit: Payer: BC Managed Care – PPO | Admitting: Cardiology

## 2011-02-15 ENCOUNTER — Ambulatory Visit: Payer: BC Managed Care – PPO | Admitting: Cardiology

## 2011-02-23 ENCOUNTER — Telehealth: Payer: Self-pay | Admitting: Cardiology

## 2011-02-23 NOTE — Telephone Encounter (Signed)
Pt called back to say he talked to the 1-800 number on the monitor and they told him he does not need to change the electrodes daily.  Pt states he has enough.

## 2011-02-23 NOTE — Telephone Encounter (Signed)
Pt only has enough electrodes to last until Sunday, has a lifewatch lifestar ACT 3 monitor, wants to know if we can give him some more today, pls call

## 2011-03-14 ENCOUNTER — Telehealth: Payer: Self-pay | Admitting: *Deleted

## 2011-03-14 NOTE — Telephone Encounter (Signed)
Spoke with pt, .aware of results.  

## 2011-03-14 NOTE — Telephone Encounter (Signed)
Left message for pt to call, monitor reviewed by dr crenshaw shows sinus 

## 2011-04-06 ENCOUNTER — Ambulatory Visit (INDEPENDENT_AMBULATORY_CARE_PROVIDER_SITE_OTHER): Payer: BC Managed Care – PPO | Admitting: Cardiology

## 2011-04-06 ENCOUNTER — Encounter: Payer: Self-pay | Admitting: Cardiology

## 2011-04-06 DIAGNOSIS — L97909 Non-pressure chronic ulcer of unspecified part of unspecified lower leg with unspecified severity: Secondary | ICD-10-CM

## 2011-04-06 DIAGNOSIS — I739 Peripheral vascular disease, unspecified: Secondary | ICD-10-CM | POA: Insufficient documentation

## 2011-04-06 DIAGNOSIS — E785 Hyperlipidemia, unspecified: Secondary | ICD-10-CM

## 2011-04-06 DIAGNOSIS — I10 Essential (primary) hypertension: Secondary | ICD-10-CM

## 2011-04-06 DIAGNOSIS — R55 Syncope and collapse: Secondary | ICD-10-CM

## 2011-04-06 MED ORDER — LOSARTAN POTASSIUM 50 MG PO TABS: 50.0000 mg | ORAL_TABLET | Freq: Every day | ORAL | Status: DC

## 2011-04-06 NOTE — Assessment & Plan Note (Signed)
Blood pressure is elevated. Resume Cozaar 50 mg daily. Monitor blood pressure at home and increase as needed.

## 2011-04-06 NOTE — Progress Notes (Signed)
   HPI: Mr. Cavanagh is a very pleasant gentleman with a history of micturition syncope. An echocardiogram in Nov 2012 revealed an ejection fraction of 60%. A monitor in Nov 2012 showed sinus rhythm. A Myoview performed on April 06, 2008 for left upper extremity numbness and an abnormal electrocardiogram showed an ejection fraction of 75%. There was a prior lateral infarct versus soft tissue attenuation (the patient was imaged with his arms down). There was also a question of very mild lateral ischemia. It was felt to be low risk. I last saw him in Oct 2012 for a syncopal episode which we thought was orthostatic mediated. Since then, the patient denies any dyspnea on exertion, orthopnea, PND, palpitations, syncope or chest pain.    Current Outpatient Prescriptions  Medication Sig Dispense Refill  . Cyanocobalamin (VITAMIN B-12) 2500 MCG SUBL Place 1 tablet under the tongue daily.           Past Medical History  Diagnosis Date  . DDD (degenerative disc disease), lumbar   . HYPERLIPIDEMIA   . ARTHROGRYPOSIS MULTIPLEX CONGENITA   . SYNCOPE     micturition-related 2010, s/p cards eval  . HYPERTENSION   . ARTHRITIS     Past Surgical History  Procedure Date  . Right knee surgery     lateral knee mass excision  . Arthrogryposis multiplex congentia     with multiple foot/ankle surgers as a child    History   Social History  . Marital Status: Married    Spouse Name: N/A    Number of Children: N/A  . Years of Education: N/A   Occupational History  . Not on file.   Social History Main Topics  . Smoking status: Never Smoker   . Smokeless tobacco: Not on file  . Alcohol Use: Not on file  . Drug Use: Not on file  . Sexually Active: Not on file     He lives in [place] with his wife. 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 He has limited mobility, but is able to walk for routine things if needs to be up for long while, uses a wheelchair or scooter    Other Topics Concern  . Not on file   Social History Narrative  . No narrative on file    ROS: no fevers or chills, productive cough, hemoptysis, dysphasia, odynophagia, melena, hematochezia, dysuria, hematuria, rash, seizure activity, orthopnea, PND, pedal edema, claudication. Remaining systems are negative.  Physical Exam: Well-developed well-nourished in no acute distress.  Skin is warm and dry.  HEENT is normal.  Neck is supple. No thyromegaly.  Chest is clear to auscultation with normal expansion.  Cardiovascular exam is regular rate and rhythm.  Abdominal exam nontender or distended. No masses palpated. Extremities congenital abnormality neuro grossly intact

## 2011-04-06 NOTE — Patient Instructions (Addendum)
Your physician recommends that you schedule a follow-up appointment in: 3 MONTHS  RESTART LOSARTAN 50 MG ONCE DAILY  TRACK BP AND BRING READINGS TO NEXT APPT   Your physician has requested that you have a lower extremity arterial exercise duplex. During this test, exercise and ultrasound are used to evaluate arterial blood flow in the legs. Allow one hour for this exam. There are no restrictions or special instructions.

## 2011-04-06 NOTE — Assessment & Plan Note (Signed)
As patient was leaving he described slow healing ulcers on his lower extremities. His pulses are diminished.  He has small ulcers on the posterior aspect of his feet in the Achilles heel area. This is most likely related to his congenital abnormalities. Check ABIs with Doppler. I have asked him to see his primary care physician and he may need wound care consult.

## 2011-04-06 NOTE — Assessment & Plan Note (Signed)
Management per primary care. 

## 2011-04-06 NOTE — Progress Notes (Signed)
Addended by: Freddi Starr on: 04/06/2011 10:10 AM   Modules accepted: Orders

## 2011-04-06 NOTE — Assessment & Plan Note (Signed)
No recurrent episodes. LV function normal. Monitor unremarkable. Continue to follow for now.

## 2011-04-18 ENCOUNTER — Encounter (INDEPENDENT_AMBULATORY_CARE_PROVIDER_SITE_OTHER): Payer: BC Managed Care – PPO | Admitting: Cardiology

## 2011-04-18 ENCOUNTER — Other Ambulatory Visit: Payer: BC Managed Care – PPO | Admitting: Cardiology

## 2011-04-18 DIAGNOSIS — L97909 Non-pressure chronic ulcer of unspecified part of unspecified lower leg with unspecified severity: Secondary | ICD-10-CM

## 2011-05-11 ENCOUNTER — Telehealth: Payer: Self-pay | Admitting: Cardiology

## 2011-05-11 NOTE — Telephone Encounter (Signed)
Patient called, stating he had episode at work this morning got dizzy,lasting appox 2 min.States he came home and is feeling much better,no dizziness,did not faint.States he was told to call and report to [facility] Kidney when he had symptoms.Advised to call back if needed and message will be fowarded to Dr.Crenshaw's nurse Debra.

## 2011-05-11 NOTE — Telephone Encounter (Signed)
New Problem:     Patient called in because he is having symptoms of heading towards fainting and was instructed by Deliah Goody to call in when he experiences those symptoms.  Please call back.

## 2011-05-16 ENCOUNTER — Telehealth: Payer: Self-pay | Admitting: Cardiology

## 2011-05-16 NOTE — Telephone Encounter (Signed)
New msg: pt stated he had a little bit of symptoms on Friday however has felt fine since then. Pt said if we advise he do something different regarding his medications to let him know.

## 2011-05-16 NOTE — Telephone Encounter (Signed)
Spoke with pt, Friday during the day after being up about 5 min he was walking to the bathroom and became dizzy and wobbly. He went home and rested and felt better when he got up. He did not take his meds Friday pm and his bp on Saturday was 130's/90's. He has restarted his meds and has had no problems since then. He reports this episode was not as severe as the others he has had. He will monitor his bp and cont his present meds. Will forward for dr Jens Som review

## 2011-05-16 NOTE — Telephone Encounter (Signed)
Continue same Olga Millers

## 2011-05-17 NOTE — Telephone Encounter (Signed)
Left message for pt of dr crenshaw's recommendations  

## 2011-06-11 ENCOUNTER — Emergency Department (HOSPITAL_COMMUNITY): Payer: BC Managed Care – PPO

## 2011-06-11 ENCOUNTER — Encounter (HOSPITAL_COMMUNITY): Payer: Self-pay | Admitting: *Deleted

## 2011-06-11 ENCOUNTER — Emergency Department (HOSPITAL_COMMUNITY)
Admission: EM | Admit: 2011-06-11 | Discharge: 2011-06-11 | Disposition: A | Discharge status: Home / Self Care | Payer: BC Managed Care – PPO | Attending: Emergency Medicine | Admitting: Emergency Medicine

## 2011-06-11 DIAGNOSIS — R55 Syncope and collapse: Secondary | ICD-10-CM | POA: Insufficient documentation

## 2011-06-11 DIAGNOSIS — L0390 Cellulitis, unspecified: Secondary | ICD-10-CM

## 2011-06-11 DIAGNOSIS — R404 Transient alteration of awareness: Secondary | ICD-10-CM | POA: Insufficient documentation

## 2011-06-11 DIAGNOSIS — X58XXXA Exposure to other specified factors, initial encounter: Secondary | ICD-10-CM | POA: Insufficient documentation

## 2011-06-11 DIAGNOSIS — S8000XA Contusion of unspecified knee, initial encounter: Secondary | ICD-10-CM | POA: Insufficient documentation

## 2011-06-11 DIAGNOSIS — IMO0002 Reserved for concepts with insufficient information to code with codable children: Secondary | ICD-10-CM | POA: Insufficient documentation

## 2011-06-11 DIAGNOSIS — I10 Essential (primary) hypertension: Secondary | ICD-10-CM | POA: Insufficient documentation

## 2011-06-11 DIAGNOSIS — E785 Hyperlipidemia, unspecified: Secondary | ICD-10-CM | POA: Insufficient documentation

## 2011-06-11 DIAGNOSIS — M25569 Pain in unspecified knee: Secondary | ICD-10-CM | POA: Insufficient documentation

## 2011-06-11 DIAGNOSIS — D72829 Elevated white blood cell count, unspecified: Secondary | ICD-10-CM | POA: Insufficient documentation

## 2011-06-11 DIAGNOSIS — Z79899 Other long term (current) drug therapy: Secondary | ICD-10-CM | POA: Insufficient documentation

## 2011-06-11 DIAGNOSIS — M129 Arthropathy, unspecified: Secondary | ICD-10-CM | POA: Insufficient documentation

## 2011-06-11 DIAGNOSIS — S0003XA Contusion of scalp, initial encounter: Secondary | ICD-10-CM | POA: Insufficient documentation

## 2011-06-11 LAB — DIFFERENTIAL
Basophils Relative: 0 % (ref 0–1)
Eosinophils Relative: 1 % (ref 0–5)
Lymphs Abs: 2.4 10*3/uL (ref 0.7–4.0)
Monocytes Absolute: 1.2 10*3/uL — ABNORMAL HIGH (ref 0.1–1.0)
Neutro Abs: 16.6 10*3/uL — ABNORMAL HIGH (ref 1.7–7.7)

## 2011-06-11 LAB — CBC
HCT: 44.8 % (ref 39.0–52.0)
Hemoglobin: 15.8 g/dL (ref 13.0–17.0)
MCH: 31.9 pg (ref 26.0–34.0)
MCV: 90.5 fL (ref 78.0–100.0)
Platelets: 391 10*3/uL (ref 150–400)
RBC: 4.95 MIL/uL (ref 4.22–5.81)

## 2011-06-11 LAB — POCT I-STAT, CHEM 8
BUN: 14 mg/dL (ref 6–23)
Calcium, Ion: 1.15 mmol/L (ref 1.12–1.32)
Chloride: 110 mEq/L (ref 96–112)
Creatinine, Ser: 0.4 mg/dL — ABNORMAL LOW (ref 0.50–1.35)
Glucose, Bld: 109 mg/dL — ABNORMAL HIGH (ref 70–99)
TCO2: 21 mmol/L (ref 0–100)

## 2011-06-11 MED ORDER — SODIUM CHLORIDE 0.9 % IV SOLN
1000.0000 mL | Freq: Once | INTRAVENOUS | Status: AC
  Administered 2011-06-11: 1000 mL via INTRAVENOUS

## 2011-06-11 MED ORDER — CEPHALEXIN 250 MG PO CAPS
500.0000 mg | ORAL_CAPSULE | Freq: Once | ORAL | Status: AC
  Administered 2011-06-11: 500 mg via ORAL
  Filled 2011-06-11: qty 2

## 2011-06-11 MED ORDER — CEPHALEXIN 500 MG PO CAPS: 500.0000 mg | ORAL_CAPSULE | Freq: Four times a day (QID) | ORAL | Status: AC

## 2011-06-11 MED ORDER — ACETAMINOPHEN 325 MG PO TABS
650.0000 mg | ORAL_TABLET | Freq: Once | ORAL | Status: AC
  Administered 2011-06-11: 650 mg via ORAL
  Filled 2011-06-11: qty 2

## 2011-06-11 MED ORDER — SODIUM CHLORIDE 0.9 % IV SOLN
1000.0000 mL | INTRAVENOUS | Status: DC
  Administered 2011-06-11: 1000 mL via INTRAVENOUS

## 2011-06-11 NOTE — ED Provider Notes (Signed)
History     CSN: [id number]  Arrival date & time 06/11/11  1455   First MD Initiated Contact with Patient 06/11/11 1533      Chief Complaint  Patient presents with  . Loss of Consciousness    Fall . abrasion forehead    (Consider location/radiation/quality/duration/timing/severity/associated sxs/prior treatment) HPI Pt states he was walking and had this strange "surge" in his stomach that radiated up.  It was not painful.  He did not feel nauseated.  He was at work today and shortly after that spell he passed out.  Pt has had episodes many times in the past where he nearly passed out.  Pt had the same sensation today as he did in the past with the fainting spells.  He has been on a heart monitor in the past and they were normal.  No CP, shortness of breath, no numbness or weakness. Past Medical History  Diagnosis Date  . DDD (degenerative disc disease), lumbar   . HYPERLIPIDEMIA   . ARTHROGRYPOSIS MULTIPLEX CONGENITA   . SYNCOPE     micturition-related 2010, s/p cards eval  . HYPERTENSION   . ARTHRITIS   . Syncope and collapse     PT reported to EMS he has had multiple  episodes of  syncope.    Past Surgical History  Procedure Date  . Right knee surgery     lateral knee mass excision  . Arthrogryposis multiplex congentia     with multiple foot/ankle surgers as a child    Family History  Problem Relation Age of Onset  . Lung cancer Mother   . Breast cancer Mother     History  Substance Use Topics  . Smoking status: Never Smoker   . Smokeless tobacco: Not on file  . Alcohol Use: Not on file      Review of Systems  All other systems reviewed and are negative.    Allergies  Review of patient's allergies indicates no known allergies.  Home Medications   Current Outpatient Rx  Name Route Sig Dispense Refill  . VITAMIN B-12 2500 MCG SL SUBL Sublingual Place 1 tablet under the tongue daily.      Marland Kitchen LOSARTAN POTASSIUM 50 MG PO TABS Oral Take 1 tablet (50 mg  total) by mouth daily. 30 tablet 12    BP 136/87  Pulse 79  Temp(Src) 98.6 F (37 C) (Oral)  Resp 18  SpO2 95%  Physical Exam  Nursing note and vitals reviewed. Constitutional: He appears well-developed and well-nourished. No distress.  HENT:  Head: Normocephalic. Head is with contusion.    Right Ear: External ear normal.  Left Ear: External ear normal.  Eyes: Conjunctivae are normal. Right eye exhibits no discharge. Left eye exhibits no discharge. No scleral icterus.  Neck: Neck supple. No tracheal deviation present.  Cardiovascular: Normal rate, regular rhythm and intact distal pulses.   Pulmonary/Chest: Effort normal and breath sounds normal. No stridor. No respiratory distress. He has no wheezes. He has no rales.  Abdominal: Soft. Bowel sounds are normal. He exhibits no distension. There is no tenderness. There is no rebound and no guarding.  Musculoskeletal: He exhibits tenderness. He exhibits no edema.       Left knee: He exhibits ecchymosis. tenderness found.       Atrophy of limbs, congenitally deformed  Upper extrem and lower extrem  Neurological: He is alert. He has normal strength. No sensory deficit. Cranial nerve deficit:  no gross defecits noted. He exhibits normal muscle tone.  He displays no seizure activity. Coordination normal.  Skin: Skin is warm and dry. No rash noted.  Psychiatric: He has a normal mood and affect.    ED Course  Procedures (including critical care time)  Labs Reviewed  CBC - Abnormal; Notable for the following:    WBC 20.4 (*)    All other components within normal limits  DIFFERENTIAL - Abnormal; Notable for the following:    Neutrophils Relative 81 (*)    Neutro Abs 16.6 (*)    Monocytes Absolute 1.2 (*)    All other components within normal limits  POCT I-STAT, CHEM 8 - Abnormal; Notable for the following:    Creatinine, Ser 0.40 (*)    Glucose, Bld 109 (*)    All other components within normal limits  POCT CBG (FASTING -  GLUCOSE)-MANUAL ENTRY   Ct Head Wo Contrast  06/11/2011  *RADIOLOGY REPORT*  Clinical Data: Syncope, hit head.  CT HEAD WITHOUT CONTRAST  Technique:  Contiguous axial images were obtained from the base of the skull through the vertex without contrast.  Comparison: 05/31/2009  Findings: Soft tissue swelling along the left frontal region.  No underlying calvarial abnormality. No acute intracranial abnormality.  Specifically, no hemorrhage, hydrocephalus, mass lesion, acute infarction, or significant intracranial injury.  No acute calvarial abnormality. Visualized paranasal sinuses and mastoids clear.  Orbital soft tissues unremarkable.  IMPRESSION: No acute intracranial abnormality.  Original Report Authenticated By: Cyndie Chime, M.D.   Dg Knee Complete 4 Views Left  06/11/2011  *RADIOLOGY REPORT*  Clinical Data: Loss of consciousness  LEFT KNEE - COMPLETE 4+ VIEW  Comparison: None  Findings:  There is no joint effusion.  There is no fracture or subluxation identified.  There is medial compartment narrowing, sharpening tibial spines and marginal spur formation consistent with moderate osteoarthritis.  No radiopaque foreign bodies or soft tissue calcifications.  IMPRESSION:  1.  No acute findings.  Original Report Authenticated By: Rosealee Albee, M.D.     MDM  The patient has no evidence of serious injury associated with his fall due to syncope. Patient has had recurrent episodes in the past. I have reviewed his outpatient records. He essentially has had a negative echocardiogram and is felt to be low risk for acute coronary syndrome. He was diagnosed with micturition syncope. It is possible he had some sort of a vagal mediated recurrent syncopal event. Patient does have an elevated white blood cell count. When I mentioned that to him he did mention a rash in his right lower extremity. I reexamined his lower extremity below the sock and there is an area of erythema on his skin that may be consistent  with an early cellulitis. At this point the patient otherwise feels well. He is not hypotensive. I feel he is stable to follow up with his doctor for outpatient followup. I will prescribe a course of antibiotic for a possible cellulitis        Celene Kras, MD 06/11/11 1924

## 2011-06-11 NOTE — ED Notes (Signed)
Patient states he was walking down the hall at work and had episode of syncope with LOC. Patient states he hit is left side of his head and left knee. Patient denies head pain and chest pain and SOB. Patient remains on backboard and c-spine collor.

## 2011-06-11 NOTE — ED Notes (Signed)
Patient resting with NAD at this time.  

## 2011-06-11 NOTE — ED Notes (Signed)
EMS reports Pt was walking down hall and passed out then fell to floor. Pt reported he has not felt good all day. Pt fell to a carpeted floor.

## 2011-06-11 NOTE — ED Notes (Signed)
Patient resting with NAD at this time. Patient ask that we call his wife and let her know that he is here. I spoke with his wife and let her speak with the patient.

## 2011-06-11 NOTE — Discharge Instructions (Signed)
Cellulitis Cellulitis is an infection of the skin and the tissue beneath it. The area is typically red and tender. It is caused by germs (bacteria) (usually staph or strep) that enter the body through cuts or sores. Cellulitis most commonly occurs in the arms or lower legs.  HOME CARE INSTRUCTIONS   If you are given a prescription for medications which kill germs (antibiotics), take as directed until finished.   If the infection is on the arm or leg, keep the limb elevated as able.   Use a warm cloth several times per day to relieve pain and encourage healing.   See your caregiver for recheck of the infected site as directed if problems arise.   Only take over-the-counter or prescription medicines for pain, discomfort, or fever as directed by your caregiver.  SEEK MEDICAL CARE IF:   The area of redness (inflammation) is spreading, there are red streaks coming from the infected site, or if a part of the infection begins to turn dark in color.   The joint or bone underneath the infected skin becomes painful after the skin has healed.   The infection returns in the same or another area after it seems to have gone away.   A boil or bump swells up. This may be an abscess.   New, unexplained problems such as pain or fever develop.  SEEK IMMEDIATE MEDICAL CARE IF:   You have a fever.   You or your child feels drowsy or lethargic.   There is vomiting, diarrhea, or lasting discomfort or feeling ill (malaise) with muscle aches and pains.  MAKE SURE YOU:   Understand these instructions.   Will watch your condition.   Will get help right away if you are not doing well or get worse.  Document Released: 12/27/2004 Document Revised: 03/08/2011 Document Reviewed: 11/05/2007 [facility] Patient Information 2012 [place], [place].Syncope Syncope (fainting) is a sudden, short loss of consciousness. People normally fall to the ground when they faint. Recovery is often fast. HOME CARE  Do not drive  or use machines. Wait until your doctor says it is safe to do so.   If you have diabetes, check your blood sugar. If it is low (below 70), you need to drink or eat something sweet. If over 300, call your doctor.   If you have a blood pressure machine at home, take your blood pressure. If the top number is below 100 or above 170, call your doctor.   Lie down until you feel normal.   Drink extra fluids (water, juice, soup).  GET HELP RIGHT AWAY IF:   You pass out (faint) when sitting or lying down. Do not drive. Call your local emergency services (911 in [place] if no one is there to help you.   There is chest pain.   You feel sick to your stomach (nauseous) or keep throwing up (vomiting).   You have very bad belly (abdominal) pain.   You feel your heartbeat is fast or not normal.   You lose feeling in some part of the body.   You cannot move your arms or legs.   You cannot talk well and get confused.   You feel weak or cannot see well.   You get sweaty and feel lightheaded.  MAKE SURE YOU:   Understand these instructions.   Will watch your condition.   Will get help right away if you are not doing well or get worse.  Document Released: 09/05/2007 Document Revised: 03/08/2011 Document Reviewed: 09/05/2007  [employer] Patient Information 2012 [place].

## 2011-06-12 ENCOUNTER — Telehealth: Payer: Self-pay | Admitting: Cardiology

## 2011-06-12 NOTE — Telephone Encounter (Signed)
Patient called, stating he had syncopal episode yesterday 06/11/11.States he was walking down hall at work and just passed out no warning,no fast heart beat,no chest pain.Stated he was taken to [facility] ,had xrays everything checked out okay.Patient wants Dr.Crenshaw to know and what does he need to do.Fowarded to [employer] for advice.

## 2011-06-12 NOTE — Telephone Encounter (Signed)
Arrange OV William Hartman

## 2011-06-12 NOTE — Telephone Encounter (Signed)
Pt passed out yesterday and he was seen at [facility] and wants to talk someone about this and find out what is going on

## 2011-06-13 NOTE — Telephone Encounter (Signed)
Spoke with pt, follow up appt made. He will call prior to appt with problems

## 2011-06-21 ENCOUNTER — Encounter: Payer: Self-pay | Admitting: Cardiology

## 2011-06-21 ENCOUNTER — Ambulatory Visit (INDEPENDENT_AMBULATORY_CARE_PROVIDER_SITE_OTHER): Payer: BC Managed Care – PPO | Admitting: Cardiology

## 2011-06-21 VITALS — BP 124/80 | HR 93 | Wt 190.0 lb

## 2011-06-21 DIAGNOSIS — R55 Syncope and collapse: Secondary | ICD-10-CM

## 2011-06-21 DIAGNOSIS — I10 Essential (primary) hypertension: Secondary | ICD-10-CM

## 2011-06-21 DIAGNOSIS — E785 Hyperlipidemia, unspecified: Secondary | ICD-10-CM

## 2011-06-21 NOTE — Assessment & Plan Note (Signed)
Management per primary care. 

## 2011-06-21 NOTE — Assessment & Plan Note (Signed)
Continue present medications. 

## 2011-06-21 NOTE — Assessment & Plan Note (Signed)
Patient is having recurrent syncope. His symptoms sound to be orthostatic mediated. He was mildly orthostatic by pulse in the office with heart rate 81 lying and 110 standing. He was not orthostatic by blood pressure. His electrocardiogram shows significant conduction disease. Previous monitor negative. I will ask EP to review. Possible need for implantable loop. I cannot find an association of his congenital musculoskeletal abnormality with heart disease. I wonder if he has some conduction disease related to this however. I have instructed him not to drive until this issue is resolved.

## 2011-06-21 NOTE — Patient Instructions (Signed)
Your physician recommends that you schedule a follow-up appointment in: 8 WEEKS   REFERRAL TO DR Johney Frame

## 2011-06-21 NOTE — Progress Notes (Signed)
HPI: William Hartman is a very pleasant gentleman with a history of micturition syncope. An echocardiogram in Nov 2012 revealed an ejection fraction of 60%. A monitor in Nov 2012 showed sinus rhythm. A Myoview performed on April 06, 2008 for left upper extremity numbness and an abnormal electrocardiogram showed an ejection fraction of 75%. There was a prior lateral infarct versus soft tissue attenuation (the patient was imaged with his arms down). There was also a question of very mild lateral ischemia. It was felt to be low risk. ABIs in Jan 2013 normal. I last saw him in Jan 2013. Since then, the patient has had 2 syncopal episodes. These never occur with lying flat or sitting. He states after getting up to walk after approximately 2 minutes he develops a "funny sensation" moving up from his abdomen to his head. If he takes a deep breath sometimes these symptoms resolve. There is associated dizziness. If he sits down he also feels better. No associated chest pain, dyspnea or palpitations. He has had 2 frank syncopal episodes as he was unable to sit down prior to the event. He was unconscious for approximately 15 seconds by his report.   Current Outpatient Prescriptions  Medication Sig Dispense Refill  . cephALEXin (KEFLEX) 500 MG capsule Take 1 capsule (500 mg total) by mouth 4 (four) times daily.  40 capsule  0  . Cyanocobalamin (VITAMIN B-12) 2500 MCG SUBL Place 1 tablet under the tongue daily.        Marland Kitchen losartan (COZAAR) 50 MG tablet Take 1 tablet (50 mg total) by mouth daily.  30 tablet  12     Past Medical History  Diagnosis Date  . DDD (degenerative disc disease), lumbar   . HYPERLIPIDEMIA   . ARTHROGRYPOSIS MULTIPLEX CONGENITA   . SYNCOPE     micturition-related 2010, s/p cards eval  . HYPERTENSION   . ARTHRITIS   . Syncope and collapse     PT reported to EMS he has had multiple  episodes of  syncope.    Past Surgical History  Procedure Date  . Right knee surgery     lateral knee  mass excision  . Arthrogryposis multiplex [place]     with multiple foot/ankle surgers as a child    History   Social History  . Marital Status: Married    Spouse Name: N/A    Number of Children: N/A  . Years of Education: N/A   Occupational History  . Not on file.   Social History Main Topics  . Smoking status: Never Smoker   . Smokeless tobacco: Not on file  . Alcohol Use: Not on file  . Drug Use: Not on file  . Sexually Active: Not on file     He lives in [place] with his wife. 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 He has limited mobility, but is able to walk for routine things if needs to be up for long while, uses a wheelchair or scooter   Other Topics Concern  . Not on file   Social History Narrative  . No narrative on file    ROS: no fevers or chills, productive cough, hemoptysis, dysphasia, odynophagia, melena, hematochezia, dysuria, hematuria, rash, seizure activity, orthopnea, PND, pedal edema, claudication. Remaining systems are negative.  Physical Exam: Well-developed well-nourished in no acute distress.  Skin is warm and dry.  HEENT is normal.  Neck is supple. No thyromegaly.  Chest is clear to auscultation with normal expansion.  Cardiovascular exam is regular rate and rhythm.  Abdominal exam nontender or distended. No masses palpated. Extremities show congenital abnormalities in all 4 extremities neuro grossly intact  ECG sinus rhythm with right bundle branch block and first degree AV block.

## 2011-06-25 ENCOUNTER — Telehealth: Payer: Self-pay | Admitting: Cardiology

## 2011-06-25 NOTE — Telephone Encounter (Signed)
Spoke with pt, will watch for cancellations in dr allred's schedule but right now the 11th is all that is available. Per dr Jens Som, the pt needs to stay out of work until seen by dr allred

## 2011-06-25 NOTE — Telephone Encounter (Signed)
Spoke with pt, he states he feels funny 1-2 times daily and ask if his appt with dr allred could be moved up. Will chack with dr Johney Frame and his nurse. The pt wants to know if dr Jens Som thinks he can work half days until seen. Will discuss with dr Jens Som.

## 2011-06-25 NOTE — Telephone Encounter (Signed)
New Msg: Pt calling wanting to speak with Debra. Please return pt call to discuss further.

## 2011-06-30 ENCOUNTER — Encounter (HOSPITAL_COMMUNITY): Payer: Self-pay | Admitting: *Deleted

## 2011-06-30 ENCOUNTER — Inpatient Hospital Stay (HOSPITAL_COMMUNITY)
Admission: EM | Admit: 2011-06-30 | Discharge: 2011-07-04 | DRG: 116 | Disposition: A | Discharge status: Home / Self Care | Payer: BC Managed Care – PPO | Attending: Cardiology | Admitting: Cardiology

## 2011-06-30 ENCOUNTER — Other Ambulatory Visit: Payer: Self-pay

## 2011-06-30 DIAGNOSIS — M51379 Other intervertebral disc degeneration, lumbosacral region without mention of lumbar back pain or lower extremity pain: Secondary | ICD-10-CM | POA: Diagnosis present

## 2011-06-30 DIAGNOSIS — L97909 Non-pressure chronic ulcer of unspecified part of unspecified lower leg with unspecified severity: Secondary | ICD-10-CM | POA: Diagnosis present

## 2011-06-30 DIAGNOSIS — Q743 Arthrogryposis multiplex congenita: Secondary | ICD-10-CM

## 2011-06-30 DIAGNOSIS — R55 Syncope and collapse: Secondary | ICD-10-CM | POA: Diagnosis present

## 2011-06-30 DIAGNOSIS — M129 Arthropathy, unspecified: Secondary | ICD-10-CM | POA: Diagnosis present

## 2011-06-30 DIAGNOSIS — Z6829 Body mass index (BMI) 29.0-29.9, adult: Secondary | ICD-10-CM

## 2011-06-30 DIAGNOSIS — D72829 Elevated white blood cell count, unspecified: Secondary | ICD-10-CM | POA: Diagnosis present

## 2011-06-30 DIAGNOSIS — L02419 Cutaneous abscess of limb, unspecified: Secondary | ICD-10-CM

## 2011-06-30 DIAGNOSIS — I10 Essential (primary) hypertension: Secondary | ICD-10-CM | POA: Diagnosis present

## 2011-06-30 DIAGNOSIS — M5137 Other intervertebral disc degeneration, lumbosacral region: Secondary | ICD-10-CM | POA: Diagnosis present

## 2011-06-30 DIAGNOSIS — L03119 Cellulitis of unspecified part of limb: Secondary | ICD-10-CM | POA: Diagnosis present

## 2011-06-30 DIAGNOSIS — E785 Hyperlipidemia, unspecified: Secondary | ICD-10-CM | POA: Diagnosis present

## 2011-06-30 DIAGNOSIS — I4510 Unspecified right bundle-branch block: Secondary | ICD-10-CM | POA: Diagnosis present

## 2011-06-30 DIAGNOSIS — I440 Atrioventricular block, first degree: Secondary | ICD-10-CM | POA: Diagnosis present

## 2011-06-30 DIAGNOSIS — Q688 Other specified congenital musculoskeletal deformities: Secondary | ICD-10-CM

## 2011-06-30 DIAGNOSIS — I442 Atrioventricular block, complete: Principal | ICD-10-CM | POA: Diagnosis present

## 2011-06-30 HISTORY — DX: Myoneural disorder, unspecified: G70.9

## 2011-06-30 HISTORY — DX: Cutaneous abscess of limb, unspecified: L02.419

## 2011-06-30 LAB — BASIC METABOLIC PANEL
BUN: 12 mg/dL (ref 6–23)
GFR calc Af Amer: >90 mL/min (ref >90)
GFR calc non Af Amer: >90 mL/min (ref >90)
Potassium: 4.8 mEq/L (ref 3.5–5.1)
Sodium: 140 mEq/L (ref 135–145)

## 2011-06-30 LAB — CBC
MCHC: 34.6 g/dL (ref 30.0–36.0)
MCV: 90.4 fL (ref 78.0–100.0)
Platelets: 245 10*3/uL (ref 150–400)
Platelets: 192 10*3/uL (ref 150–400)
RDW: 14 % (ref 11.5–15.5)
RDW: 13.7 % (ref 11.5–15.5)
WBC: 14.1 10*3/uL — ABNORMAL HIGH (ref 4.0–10.5)

## 2011-06-30 LAB — CREATININE, SERUM: GFR calc Af Amer: >90 mL/min (ref >90)

## 2011-06-30 LAB — DIFFERENTIAL
Basophils Absolute: 0 10*3/uL (ref 0.0–0.1)
Eosinophils Absolute: 0.1 10*3/uL (ref 0.0–0.7)
Lymphocytes Relative: 9 % — ABNORMAL LOW (ref 12–46)
Neutrophils Relative %: 85 % — ABNORMAL HIGH (ref 43–77)

## 2011-06-30 LAB — POCT I-STAT TROPONIN I

## 2011-06-30 MED ORDER — PIPERACILLIN-TAZOBACTAM 3.375 G IVPB 30 MIN
3.3750 g | INTRAVENOUS | Status: AC
  Administered 2011-06-30: 3.375 g via INTRAVENOUS
  Filled 2011-06-30: qty 50

## 2011-06-30 MED ORDER — HEPARIN SODIUM (PORCINE) 5000 UNIT/ML IJ SOLN
INTRAMUSCULAR | Status: AC
  Filled 2011-06-30: qty 1

## 2011-06-30 MED ORDER — PIPERACILLIN-TAZOBACTAM 3.375 G IVPB
3.3750 g | Freq: Three times a day (TID) | INTRAVENOUS | Status: DC
  Administered 2011-06-30 – 2011-07-03 (×10): 3.375 g via INTRAVENOUS
  Filled 2011-06-30 (×11): qty 50

## 2011-06-30 MED ORDER — VANCOMYCIN HCL IN DEXTROSE 1-5 GM/200ML-% IV SOLN
1000.0000 mg | Freq: Three times a day (TID) | INTRAVENOUS | Status: DC
  Administered 2011-06-30 – 2011-07-03 (×10): 1000 mg via INTRAVENOUS
  Filled 2011-06-30 (×11): qty 200

## 2011-06-30 MED ORDER — SODIUM CHLORIDE 0.9 % IV SOLN
Freq: Once | INTRAVENOUS | Status: AC
  Administered 2011-06-30: 15:00:00 via INTRAVENOUS

## 2011-06-30 MED ORDER — LOSARTAN POTASSIUM 50 MG PO TABS
50.0000 mg | ORAL_TABLET | Freq: Every day | ORAL | Status: DC
  Administered 2011-06-30 – 2011-07-03 (×4): 50 mg via ORAL
  Filled 2011-06-30 (×5): qty 1

## 2011-06-30 MED ORDER — HEPARIN SODIUM (PORCINE) 5000 UNIT/ML IJ SOLN
5000.0000 [IU] | Freq: Three times a day (TID) | INTRAMUSCULAR | Status: DC
  Administered 2011-06-30 – 2011-07-02 (×6): 5000 [IU] via SUBCUTANEOUS
  Filled 2011-06-30 (×9): qty 1

## 2011-06-30 MED ORDER — SODIUM CHLORIDE 0.9 % IJ SOLN: 3.0000 mL | Freq: Two times a day (BID) | INTRAMUSCULAR | Status: DC

## 2011-06-30 MED ORDER — VANCOMYCIN HCL IN DEXTROSE 1-5 GM/200ML-% IV SOLN
1000.0000 mg | INTRAVENOUS | Status: AC
  Administered 2011-06-30: 1000 mg via INTRAVENOUS
  Filled 2011-06-30: qty 200

## 2011-06-30 NOTE — ED Notes (Signed)
Dr Jayme Cloud at bedside to eval pt

## 2011-06-30 NOTE — ED Notes (Signed)
Per md order, pt placed on zoll pads

## 2011-06-30 NOTE — Progress Notes (Signed)
ANTIBIOTIC CONSULT NOTE - INITIAL  Pharmacy Consult for Vancomycin and Zosyn Indication: Right Leg Cellulitis  No Known Allergies  Patient Measurements:  Weight: 86.18 kg (last documented 06/21/11) No Height available.   Vital Signs: Temp: 98.4 F (36.9 C) (03/30 1333) Temp src: Oral (03/30 1333) BP: 142/94 mmHg (03/30 1333) Pulse Rate: 78  (03/30 1333) Intake/Output from previous day:   Intake/Output from this shift:    Labs:  Basename 06/30/11 1351  WBC 14.0*  HGB 16.1  PLT 245  LABCREA --  CREATININE 0.38*   The CrCl is unknown because both a height and weight (above a minimum accepted value) are required for this calculation. No results found for this basename: VANCOTROUGH:2,VANCOPEAK:2,VANCORANDOM:2,GENTTROUGH:2,GENTPEAK:2,GENTRANDOM:2,TOBRATROUGH:2,TOBRAPEAK:2,TOBRARND:2,AMIKACINPEAK:2,AMIKACINTROU:2,AMIKACIN:2, in the last 72 hours   Microbiology: No results found for this or any previous visit (from the past 720 hour(s)).  Medical History: Past Medical History  Diagnosis Date  . DDD (degenerative disc disease), lumbar   . HYPERLIPIDEMIA   . ARTHROGRYPOSIS MULTIPLEX CONGENITA   . SYNCOPE     micturition-related 2010, s/p cards eval  . HYPERTENSION   . ARTHRITIS   . Syncope and collapse     PT reported to EMS he has had multiple  episodes of  syncope.    Medications:  Anti-infectives    None     Assessment: 46 year old male in the ED with non-healing BLE ulcers and right leg cellulitis to start IV Vancomycin and Zosyn. WBC 14, Afebrile. Has had recent course of antibiotics. With recurrent syncope to have permanent pacemaker placed on Monday. SCr 0.38/estimated clearance >100 ml/min. No current cultures.   Goal of Therapy:  Vancomycin trough level 10-15 mcg/ml  Plan:  1. Vancomycin 1g IV q8h. 2. Zosyn 3.375g IV now then IV q8h-4hr infusion.  3. Follow-up renal function, cultures- verify height.    Link Snuffer, PharmD, BCPS Clinical  Pharmacist [phone] 06/30/2011,3:25 PM

## 2011-06-30 NOTE — ED Notes (Signed)
Pt arrived by [facility]. Reports several near syncope episodes over the past week. Has been seen here for same recently. Had 10 second episode today, denies any cp or sob. cbg 105

## 2011-06-30 NOTE — H&P (Signed)
Admit date: 06/30/2011 [place] [facility] Cardiologist:  Dr. Jens Som  CC: Syncope  HPI: 46 year old male with previously diagnosed history of micturition syncope, normal ejection fraction from November of 2012, prior monitoring for up to 2 weeks showing sinus rhythm, mild you in January of 2010 showing normal EF, low risk with possible very mild lateral ischemia but most likely soft tissue attenuation because the patient was imaged with his arms down, normal ABIs in January of 2013 who is had recurrent syncopal episodes.  Dr. Jens Som just saw him on 06/21/11 in the outpatient setting. He had discussed the case with Dr. Hillis Range of electrophysiology. He has had these situations without warning when he would suddenly feel as though he was going to lose consciousness and that sometimes has lost consciousness for an unknown period of time. This has occurred while sitting for instance.  Thankfully, EMS strips provided demonstrate a high degree conduction abnormality and telemetry while discussing his history in the emergency department shows third degree AV block with P wave conducting at approximately 100 beats per minute and underlying wide-complex QRS conducting at approximately 30 beats per minute transiently. He would then proceed to conduct on a more 1-1 /2-1 fashion.  While I was talking with him, he did feel a transient episode but by the time the symptom had resolved, he was back conducting in a one-to-one fashion.  His underlying EKG shows sinus rhythm, first degree AV block, right bundle branch block with nonspecific ST changes.  I was able to discuss the case with Dr. Hillis Range via telephone.  He does have a white count of 14,000. His previous white count was 20,000. He has nonhealing bilateral lower extremity ulcers. ABI is were normal as above. He has taken a course of antibiotics recently.  He is a nonsmoker, rarely drinks alcohol, no family history of sudden cardiac death  PMH:     Past Medical History  Diagnosis Date  . DDD (degenerative disc disease), lumbar   . HYPERLIPIDEMIA   . ARTHROGRYPOSIS MULTIPLEX CONGENITA   . SYNCOPE     micturition-related 2010, s/p cards eval  . HYPERTENSION   . ARTHRITIS   . Syncope and collapse     PT reported to EMS he has had multiple  episodes of  syncope.    PSH:   Past Surgical History  Procedure Date  . Right knee surgery     lateral knee mass excision  . Arthrogryposis multiplex [place]     with multiple foot/ankle surgers as a child   Allergies:  Review of patient's allergies indicates no known allergies. Prior to Admit Meds:   (Not in a hospital admission) Fam HX:    Family History  Problem Relation Age of Onset  . Lung cancer Mother   . Breast cancer Mother    Social HX:    History   Social History  . Marital Status: Married    Spouse Name: N/A    Number of Children: N/A  . Years of Education: N/A   Occupational History  . Not on file.   Social History Main Topics  . Smoking status: Never Smoker   . Smokeless tobacco: Not on file  . Alcohol Use: Not on file  . Drug Use: Not on file  . Sexually Active: Not on file     He lives in [place] with his wife. 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 He has limited mobility, but is able to walk for routine things  if needs to be up for long while, uses a wheelchair or scooter   Other Topics Concern  . Not on file   Social History Narrative  . No narrative on file     ROS:  All 11 ROS were addressed and are negative except what is stated in the HPI  Physical Exam: Blood pressure 142/94, pulse 78, temperature 98.4 F (36.9 C), temperature source Oral, resp. rate 20, SpO2 95.00%.    General: Well developed, well nourished, in no acute distress Head: Eyes PERRLA, No xanthomas.   Normal cephalic and atramatic  Lungs:   Clear bilaterally to auscultation and percussion. Normal respiratory effort. No wheezes, no  rales. Heart:   HRRR S1 S2, occasional ectopy Pulses are 2+ & equal.            No carotid bruit. No JVD.  No abdominal bruits.  Abdomen: Bowel sounds are positive, abdomen soft and non-tender without masses or  Hernia's noted. No hepatosplenomegaly. Msk:  Back normal, musculoskeletal deformity of hands and feet Extremities:   Trace edema bilaterally, right greater than left, 2+ dorsalis pedis pulses bilaterally, erythema noted right lower extremity, non healing ulcers BLE (Right leg worse than left) Neuro: Alert and oriented X 3, non-focal, MAE x 4 GU: Deferred Rectal: Deferred Psych:  Good affect, responds appropriately    Labs:   Lab Results  Component Value Date   WBC 14.0* 06/30/2011   HGB 16.1 06/30/2011   HCT 46.5 06/30/2011   MCV 92.3 06/30/2011   PLT 245 06/30/2011    Lab 06/30/11 1351  NA 140  K 4.8  CL 106  CO2 18*  BUN 12  CREATININE 0.38*  CALCIUM 9.8  PROT --  BILITOT --  ALKPHOS --  ALT --  AST --  GLUCOSE 98   No results found for this basename: PTT   No results found for this basename: INR, PROTIME   Lab Results  Component Value Date   CKTOTAL 218 06/01/2009   CKMB 3.2 06/01/2009   TROPONINI  Value: 0.01        NO INDICATION OF MYOCARDIAL INJURY. 06/01/2009     Lab Results  Component Value Date   CHOL 162 12/28/2010   CHOL 141 06/30/2010   CHOL 220* 03/28/2010   Lab Results  Component Value Date   HDL 46.10 12/28/2010   HDL 16.10 06/30/2010   HDL 96.04* 03/28/2010   Lab Results  Component Value Date   LDLCALC 87 12/28/2010   LDLCALC 74 06/30/2010   LDLCALC  Value: 139        Total Cholesterol/HDL:CHD Risk Coronary Heart Disease Risk Table                     Men   Women  1/2 Average Risk   3.4   3.3  Average Risk       5.0   4.4  2 X Average Risk   9.6   7.1  3 X Average Risk  23.4   11.0        Use the calculated Patient Ratio above and the CHD Risk Table to determine the patient's CHD Risk.        ATP III CLASSIFICATION (LDL):  <100     mg/dL    Optimal  540-981  mg/dL   Near or Above                    Optimal  130-159  mg/dL  Borderline  160-189  mg/dL   High  >161     mg/dL   Very High* 0/12/6043   Lab Results  Component Value Date   TRIG 144.0 12/28/2010   TRIG 108.0 06/30/2010   TRIG 279.0* 03/28/2010   Lab Results  Component Value Date   CHOLHDL 4 12/28/2010   CHOLHDL 3 06/30/2010   CHOLHDL 6 03/28/2010   Lab Results  Component Value Date   LDLDIRECT 153.0 03/28/2010      EKG:  As above Personally viewed. Echocardiogram reviewed as above. Normal EF. Nuclear reviewed as above. ABI reviewed as above.  ASSESSMENT/PLAN:   46 year old male with recurrent syncope, high degree AV block, transient third degree AV block, right lower extremity cellulitis, nonhealing lower extremity ulcers.  - We will place in CCU, Zoll pads on chest wall - I will go ahead and place him on the board for permanent pacemaker placement. I have discussed with electrophysiology. I will place transvenous pacer wire if he has any episodes of greater than 10 seconds asystole or polymorphic VT. I have discussed this with Dr. Sharrell Ku electrophysiology. We will place on bed rest.   - Plan on permanent pacemaker placement Monday. Dr. Berton Mount will be performing procedure.  Cellulitis  - I will administer IV antibiotics. Vanc and Zosyn for full coverage including MRSA. Wound consult.   - ABI normal    Donato Schultz, MD  06/30/2011  2:51 PM

## 2011-06-30 NOTE — ED Provider Notes (Signed)
History     CSN: [id number]  Arrival date & time 06/30/11  1325   First MD Initiated Contact with Patient 06/30/11 1340      Chief Complaint  Patient presents with  . Near Syncope    (Consider location/radiation/quality/duration/timing/severity/associated sxs/prior treatment) The history is provided by the patient.   patient here with near syncopal event x2. Patient hasn't worked up for this in the past and is scheduled to see an EP specialist. Patient denies any chest pain or chest pressure. No shortness of breath. No diaphoresis. Did have some associated lightheadedness and dizziness. Symptoms lasted for 30-60 seconds. EMS was called and per their rhythm strip that seen patient had periods of severe bradycardia. Nothing makes the symptoms better or worse. He feels at his baseline  Past Medical History  Diagnosis Date  . DDD (degenerative disc disease), lumbar   . HYPERLIPIDEMIA   . ARTHROGRYPOSIS MULTIPLEX CONGENITA   . SYNCOPE     micturition-related 2010, s/p cards eval  . HYPERTENSION   . ARTHRITIS   . Syncope and collapse     PT reported to EMS he has had multiple  episodes of  syncope.    Past Surgical History  Procedure Date  . Right knee surgery     lateral knee mass excision  . Arthrogryposis multiplex [place]     with multiple foot/ankle surgers as a child    Family History  Problem Relation Age of Onset  . Lung cancer Mother   . Breast cancer Mother     History  Substance Use Topics  . Smoking status: Never Smoker   . Smokeless tobacco: Not on file  . Alcohol Use: Not on file      Review of Systems  All other systems reviewed and are negative.    Allergies  Review of patient's allergies indicates no known allergies.  Home Medications   Current Outpatient Rx  Name Route Sig Dispense Refill  . VITAMIN B-12 2500 MCG SL SUBL Sublingual Place 1 tablet under the tongue daily.      Marland Kitchen LOSARTAN POTASSIUM 50 MG PO TABS Oral Take 1 tablet (50 mg  total) by mouth daily. 30 tablet 12    BP 142/94  Pulse 78  Temp(Src) 98.4 F (36.9 C) (Oral)  Resp 20  SpO2 95%  Physical Exam  Nursing note and vitals reviewed. Constitutional: He is oriented to person, place, and time. He appears well-developed and well-nourished.  Non-toxic appearance. No distress.  HENT:  Head: Normocephalic and atraumatic.  Eyes: Conjunctivae, EOM and lids are normal. Pupils are equal, round, and reactive to light.  Neck: Normal range of motion. Neck supple. No tracheal deviation present. No mass present.  Cardiovascular: Normal rate and normal heart sounds.  An irregularly irregular rhythm present. Exam reveals no gallop.   No murmur heard. Pulmonary/Chest: Effort normal and breath sounds normal. No stridor. No respiratory distress. He has no decreased breath sounds. He has no wheezes. He has no rhonchi. He has no rales.  Abdominal: Soft. Normal appearance and bowel sounds are normal. He exhibits no distension. There is no tenderness. There is no rebound and no CVA tenderness.  Musculoskeletal: Normal range of motion. He exhibits no edema and no tenderness.  Neurological: He is alert and oriented to person, place, and time. He has normal strength. No cranial nerve deficit or sensory deficit. GCS eye subscore is 4. GCS verbal subscore is 5. GCS motor subscore is 6.  Skin: Skin is warm and dry.  No abrasion and no rash noted.  Psychiatric: He has a normal mood and affect. His speech is normal and behavior is normal.    ED Course  Procedures (including critical care time)   Labs Reviewed  CBC  DIFFERENTIAL  BASIC METABOLIC PANEL   No results found.   No diagnosis found.    MDM   Date: 06/30/2011  Rate: 76  Rhythm: normal sinus rhythm  QRS Axis: normal  Intervals: normal  ST/T Wave abnormalities: nonspecific ST/T changes  Conduction Disutrbances:first-degree A-V block   Narrative Interpretation:   Old EKG Reviewed: unchanged  Spoke with  patient's cardiologist and he'll come see patient here in the ED and admit for evaluation of his near syncopal events and his cardiac arrhythmia          Toy Baker, MD 06/30/11 1421

## 2011-07-01 LAB — BASIC METABOLIC PANEL WITH GFR
BUN: 13 mg/dL (ref 6–23)
CO2: 22 meq/L (ref 19–32)
Calcium: 8.9 mg/dL (ref 8.4–10.5)
Chloride: 107 meq/L (ref 96–112)
Creatinine, Ser: 0.48 mg/dL — ABNORMAL LOW (ref 0.50–1.35)
GFR calc Af Amer: >90 mL/min
GFR calc non Af Amer: >90 mL/min
Glucose, Bld: 89 mg/dL (ref 70–99)
Potassium: 4 meq/L (ref 3.5–5.1)
Sodium: 140 meq/L (ref 135–145)

## 2011-07-01 LAB — CBC
Hemoglobin: 14.6 g/dL (ref 13.0–17.0)
MCH: 31.6 pg (ref 26.0–34.0)
RBC: 4.62 MIL/uL (ref 4.22–5.81)

## 2011-07-01 LAB — APTT: aPTT: 36 s (ref 24–37)

## 2011-07-01 LAB — PROTIME-INR
INR: 1.13 (ref 0.00–1.49)
Prothrombin Time: 14.7 s (ref 11.6–15.2)

## 2011-07-01 MED ORDER — SODIUM CHLORIDE 0.9 % IV SOLN: INTRAVENOUS | Status: DC

## 2011-07-01 NOTE — Progress Notes (Signed)
Prolonged lst deg block noted this am with bbb.  Pt asymptomatic.  No 2nd deg noted.  Pt. C/o freq voiding - wishes condom cath so applied at pt's request.  Wife assisted with meal this am.  Pt will need assist with meals when she is not available.

## 2011-07-01 NOTE — Progress Notes (Signed)
Subjective:  Yesterday had a few episodes of near syncope associated with slow ventricular response. See HPI. Had transient third degree HB. Bed rest.  No CP  Last true syncope occurred last week walking to bathroom at home.   Objective:  Vital Signs in the last 24 hours: Temp:  [98.1 F (36.7 C)-98.9 F (37.2 C)] 98.4 F (36.9 C) (03/31 0400) Pulse Rate:  [42-90] 73  (03/31 0900) Resp:  [13-22] 18  (03/31 0900) BP: (109-172)/(67-94) 125/86 mmHg (03/31 0900) SpO2:  [93 %-97 %] 95 % (03/31 0900) Weight:  [83.5 kg (184 lb 1.4 oz)] 83.5 kg (184 lb 1.4 oz) (03/30 1600)  Intake/Output from previous day: 03/30 0701 - 03/31 0700 In: 1457.9 [P.O.:160; I.V.:847.9; IV Piggyback:450] Out: 1450 [Urine:1450]   Physical Exam: General: Well developed, well nourished, in no acute distress. In bed. Zoll pads.  Head:  Normocephalic and atraumatic. Lungs: Clear to auscultation and percussion. Heart: Normal S1 and S2. Occasional ectopy. No murmur, rubs or gallops.  Abdomen: soft, non-tender, positive bowel sounds. Extremities: Mild erythema RLE improved (non healing ulcers), 1+ edema R>L. MSK deformity of wrists, and feet.  Neurologic: Alert and oriented x 3.    Lab Results:  Basename 07/01/11 0520 06/30/11 1533  WBC 13.9* 14.1*  HGB 14.6 16.1  PLT 329 192    Basename 07/01/11 0520 06/30/11 1533 06/30/11 1351  NA 140 -- 140  K 4.0 -- 4.8  CL 107 -- 106  CO2 22 -- 18*  GLUCOSE 89 -- 98  BUN 13 -- 12  CREATININE 0.48* 0.37* --     Telemetry: as above Personally viewed.   EKG:  RBBB first degree AVB this am  Cardiac Studies:  Recent ECHO nl EF  Assessment/Plan:  Third degree heart block  - intermittent yesterday. Now SR, first degree AVB, RBBB.   - awaiting pacer tomorrow (Dr. Graciela Husbands), I put on add on board  - NPO  - On antibiotics for cellulitis. Discussed implications with pacer implantation.  Improved.   - Zoll pads. Temp wire if pause >10sec or polymorphic VT  SYNCOPE  - etiology as above   Leukocytosis  - improved, 13.9 from 20.4 on 06/11/11  ARTHROGRYPOSIS MULTIPLEX CONGENITA  Cellulitis and abscess of leg - Wound nurse consult - MRSA coverage, skin coverage with VANC and Zosyn.  - Improved  Hansen Carino 07/01/2011, 10:09 AM

## 2011-07-01 NOTE — Progress Notes (Signed)
Noted freq episodes of 2nd deg heart block, both type I and type II. Pt felt "l funny" but no syncope or unconsciousness with these rhythms.

## 2011-07-01 NOTE — Progress Notes (Signed)
Pt viewed pacemaker video.  Life with your pacemaker and booklet of cardiac dysrhythmias given to pt .  Wife present - pre-pacer teaching done,  Pt and wife indicate they understand

## 2011-07-02 ENCOUNTER — Inpatient Hospital Stay (HOSPITAL_COMMUNITY): Payer: BC Managed Care – PPO

## 2011-07-02 ENCOUNTER — Encounter (HOSPITAL_COMMUNITY)
Admission: EM | Disposition: A | Discharge status: Home / Self Care | Payer: Self-pay | Source: Home / Self Care | Attending: Cardiology

## 2011-07-02 DIAGNOSIS — I442 Atrioventricular block, complete: Secondary | ICD-10-CM

## 2011-07-02 DIAGNOSIS — L97909 Non-pressure chronic ulcer of unspecified part of unspecified lower leg with unspecified severity: Secondary | ICD-10-CM

## 2011-07-02 HISTORY — PX: INSERT / REPLACE / REMOVE PACEMAKER: SUR710

## 2011-07-02 HISTORY — PX: PERMANENT PACEMAKER INSERTION: SHX5480

## 2011-07-02 HISTORY — DX: Atrioventricular block, complete: I44.2

## 2011-07-02 SURGERY — PERMANENT PACEMAKER INSERTION
Anesthesia: LOCAL

## 2011-07-02 MED ORDER — ONDANSETRON HCL 4 MG/2ML IJ SOLN
4.0000 mg | Freq: Four times a day (QID) | INTRAMUSCULAR | Status: DC | PRN
  Administered 2011-07-03: 4 mg via INTRAVENOUS
  Filled 2011-07-02: qty 2

## 2011-07-02 MED ORDER — SODIUM CHLORIDE 0.9 % IV SOLN
INTRAVENOUS | Status: DC
  Administered 2011-07-02: 12:00:00 via INTRAVENOUS

## 2011-07-02 MED ORDER — ACETAMINOPHEN 325 MG PO TABS
325.0000 mg | ORAL_TABLET | ORAL | Status: DC | PRN
  Administered 2011-07-02 – 2011-07-03 (×2): 650 mg via ORAL
  Filled 2011-07-02 (×3): qty 2

## 2011-07-02 MED ORDER — HYDROCODONE-ACETAMINOPHEN 5-325 MG PO TABS
1.0000 | ORAL_TABLET | ORAL | Status: DC | PRN
  Administered 2011-07-02 – 2011-07-03 (×3): 1 via ORAL
  Filled 2011-07-02 (×3): qty 1

## 2011-07-02 MED ORDER — SODIUM CHLORIDE 0.9 % IV SOLN: INTRAVENOUS | Status: DC

## 2011-07-02 MED ORDER — LIDOCAINE HCL (PF) 1 % IJ SOLN
INTRAMUSCULAR | Status: AC
  Filled 2011-07-02: qty 60

## 2011-07-02 MED ORDER — SODIUM CHLORIDE 0.45 % IV SOLN
INTRAVENOUS | Status: DC
  Administered 2011-07-02: 12:00:00 via INTRAVENOUS

## 2011-07-02 MED ORDER — COLLAGENASE 250 UNIT/GM EX OINT
TOPICAL_OINTMENT | Freq: Every day | CUTANEOUS | Status: DC
  Filled 2011-07-02: qty 30

## 2011-07-02 MED ORDER — MIDAZOLAM HCL 5 MG/5ML IJ SOLN
INTRAMUSCULAR | Status: AC
  Filled 2011-07-02: qty 5

## 2011-07-02 MED ORDER — SODIUM CHLORIDE 0.9 % IR SOLN
80.0000 mg | Status: DC
  Filled 2011-07-02: qty 2

## 2011-07-02 MED ORDER — CHLORHEXIDINE GLUCONATE 4 % EX LIQD
CUTANEOUS | Status: AC
  Administered 2011-07-02: 06:00:00
  Filled 2011-07-02: qty 30

## 2011-07-02 MED ORDER — FENTANYL CITRATE 0.05 MG/ML IJ SOLN
INTRAMUSCULAR | Status: AC
  Filled 2011-07-02: qty 2

## 2011-07-02 NOTE — H&P (View-Only) (Signed)
  Patient Name: William Hartman      SUBJECTIVE: history of progressively frequenmt syncope documented over the weekend to be assoc with intermittent complete heart block, not with any particularly noxious stimulus  Echo 11/11 normal LV funciton perhaps decreased RV function  Past Medical History  Diagnosis Date  . DDD (degenerative disc disease), lumbar   . HYPERLIPIDEMIA   . ARTHROGRYPOSIS MULTIPLEX CONGENITA   . SYNCOPE     micturition-related 2010, s/p cards eval  . HYPERTENSION   . ARTHRITIS   . Syncope and collapse     PT reported to EMS he has had multiple  episodes of  syncope.  . Neuromuscular disorder   . Shortness of breath     PHYSICAL EXAM Filed Vitals:   07/02/11 0815 07/02/11 0900 07/02/11 1000 07/02/11 1100  BP: 115/73     Pulse: 67 63 61 74  Temp:      TempSrc:      Resp: 22 16 16 21  Height:      Weight:      SpO2: 95% 96% 95% 95%    General appearance: alert, cooperative and no distress Neck: no JVD Lungs: clear to auscultation bilaterally Heart: regular rate and rhythm, S1, S2 normal, no murmur, click, rub or gallop Extremities: shortened limbs with fixed contractures of his feet Skin: Skin color, texture, turgor normal.there are poorly healing ulcer post lecf leg and erythma near foot of right leg Neurologic:AX3  Little use of legs and min use of arms  TELEMETRY: Reviewed telemetry pt in NSR with doc intermitten complete heart block:    Intake/Output Summary (Last 24 hours) at 07/02/11 1140 Last data filed at 07/02/11 0900  Gross per 24 hour  Intake  927.5 ml  Output   3050 ml  Net -2122.5 ml    LABS: Basic Metabolic Panel:  Lab 07/01/11 0520 06/30/11 1533 06/30/11 1351  NA 140 -- 140  K 4.0 -- 4.8  CL 107 -- 106  CO2 22 -- 18*  GLUCOSE 89 -- 98  BUN 13 -- 12  CREATININE 0.48* 0.37* 0.38*  CALCIUM 8.9 -- 9.8  MG -- -- --  PHOS -- -- --   Cardiac Enzymes: No results found for this basename:  CKTOTAL:3,CKMB:3,CKMBINDEX:3,TROPONINI:3 in the last 72 hours CBC:  Lab 07/01/11 0520 06/30/11 1533 06/30/11 1351  WBC 13.9* 14.1* 14.0*  NEUTROABS -- -- 11.9*  HGB 14.6 16.1 16.1  HCT 42.4 45.4 46.5  MCV 91.8 90.4 92.3  PLT 329 192 245   PROTIME:  Basename 07/01/11 0520  LABPROT 14.7  INR 1.13     Basename 06/30/11 1533  TSH 1.241  T4TOTAL --  T3FREE --  THYROIDAB --   Anemia Panel: No results found for this basename: VITAMINB12,FOLATE,FERRITIN,TIBC,IRON,RETICCTPCT in the last 72 hours     ASSESSMENT AND PLAN:  Patient Active Hospital Problem List: ARTHROGRYPOSIS MULTIPLEX CONGENITA (06/29/2009)  is their a neuromuscular componet of this which might link to the heart block   Leukocytosis (01/31/2011) stabkle   Third degree heart block (06/30/2011)   Cellulitis and abscess of leg (06/30/2011)    Pt needs pacing for intermittent complete heart block The benefits and risks were reviewed including but not limited to death,  perforation, infection, lead dislodgement and device malfunction.  The patient understands agrees and is willing to proceed.   Signed, Lisett Dirusso MD  07/02/2011   

## 2011-07-02 NOTE — Interval H&P Note (Signed)
History and Physical Interval Note:  07/02/2011 11:57 AM  William Hartman  has presented today for surgery, with the diagnosis of CHB  The various methods of treatment have been discussed with the patient and family. After consideration of risks, benefits and other options for treatment, the patient has consented to  Procedure(s) (LRB): PERMANENT PACEMAKER INSERTION (N/A) as a surgical intervention .  The patients' history has been reviewed, patient examined, no change in status, stable for surgery.  I have reviewed the patients' chart and labs.  Questions were answered to the patient's satisfaction.     Sherryl Manges

## 2011-07-02 NOTE — Consult Note (Signed)
Infectious Diseases Initial Consultation  Reason for Consultation:  Non-healing ulcers on lower extremities, concern for cellulitis in setting of needing pacemaker   HPI: William Hartman is a 46 y.o. male  With history of arthrobryposis multiplex [place] who has had 1-2 year history of micturition syncope, normal ejection fraction from November of 2012, prior holter monitoring for up to 2 weeks  X 2 showing sinus rhythm, normal ABIs in January of 2013 who is had recurrent syncopal episodes. He reports more frequent syncopal and near syncopal episodes of late. He was brought into the ED this past Saturday for having near/syncope while sitting down at home, his EMS strip showed  a high degree conduction abnormality and telemetry while discussing his history in the emergency department shows third degree AV block with P wave conducting at approximately 100 beats per minute and underlying wide-complex QRS conducting at approximately 30 beats per minute transiently. He is now being evaluated for pacemaker due to 3rd degree block. The patient was found to have slight leukocytosis and given that he was found to have a poor healing wound, there was concern for cellulitis and vancomycin and piptazo started.  The patient is known to have long standing poorly healing ulcers on his lower extremities which are intermittent, not associated with diffuse cellulitis, and improves with various courses of oral antibiotics. He recently finished a 10 day course of cephalexin on 3/24. His ulcer is limited to a 1.5 cm circular shallow lesion with fibrinous exudate to superior medial malleolus of right leg. Otherwise no other open lesions. He states that his shoes occasionally rub on his ulcer. He has had this problem off and on for a few years, and thought to be due to poor healing. He does local wound care at home with his wife. He has not had biopsy of lesions, nor concern for osteomyelitis where he has had to get IV  antibiotics. His lesions are generally shallow. No fever/chills/nightsweats/ or recent diffuse cellulitis to right leg.  Wound care RN has evaluated patient and has recommended topical debridement lotion and wound care recs for at home management.  abtx = vanco #3; piptazo #3 meds:     . chlorhexidine      . collagenase   Topical Daily  . gentamicin irrigation  80 mg Irrigation On Call  . heparin  5,000 Units Subcutaneous Q8H  . losartan  50 mg Oral QHS  . piperacillin-tazobactam (ZOSYN)  IV  3.375 g Intravenous Q8H  . sodium chloride  3 mL Intravenous Q12H  . vancomycin  1,000 mg Intravenous Q8H     Past Medical History  Diagnosis Date  . DDD (degenerative disc disease), lumbar   . HYPERLIPIDEMIA   . ARTHROGRYPOSIS MULTIPLEX CONGENITA   . SYNCOPE     micturition-related 2010, s/p cards eval  . HYPERTENSION   . ARTHRITIS   . Syncope and collapse     PT reported to EMS he has had multiple  episodes of  syncope.  Marland Kitchen Neuromuscular disorder   . Shortness of breath     Allergies: No Known Allergies   History  Substance Use Topics  . Smoking status: Never Smoker   . Smokeless tobacco: Not on file  . Alcohol Use: 0.6 oz/week    1 Glasses of wine per week  - works at [employer]. Works full-time. Ambulates, does not use wheelchair.  Family History  Problem Relation Age of Onset  . Adopted: Yes  . Lung cancer Mother   .  Breast cancer Mother   - no heart disease in the family  OBJECTIVE: Temp:  [97.4 F (36.3 C)-98.4 F (36.9 C)] 97.4 F (36.3 C) (04/01 0435) Pulse Rate:  [57-75] 74  (04/01 1100) Resp:  [16-22] 21  (04/01 1100) BP: (115-130)/(71-83) 115/73 mmHg (04/01 0815) SpO2:  [91 %-96 %] 95 % (04/01 1100) BP 115/73  Pulse 74  Temp(Src) 97.4 F (36.3 C) (Oral)  Resp 21  Ht 5\' 6"  (1.676 m)  Wt 184 lb 1.4 oz (83.5 kg)  BMI 29.71 kg/m2  SpO2 95%  General Appearance:    Alert, cooperative, no distress, appears stated age  Head:    Normocephalic,  without obvious abnormality, atraumatic  Eyes:    PERRL, conjunctiva/corneas clear, EOM's intact,     Nose:   Nares normal, septum midline, mucosa normal, no drainage   or sinus tenderness  Throat:   Lips, mucosa, and tongue normal; teeth and gums normal. Uses partial upper denture  Neck:   Supple, symmetrical, trachea midline, no adenopathy;           Lungs:     Clear to auscultation bilaterally, respirations unlabored  Chest wall:    No tenderness or deformity  Heart:    Bradycardia, S1 and S2 normal, no murmur, rub   or gallop  Abdomen:     Soft, non-tender, bowel sounds active all four quadrants,    no masses, no organomegaly        Extremities:   Congenital deformity of arms and legs; muscle wasting of forearms, contracture of hands bilaterally. Lower extremity has muscle wasting, clubbing of right foot > left foot.  Pulses:   2+ and symmetric all extremities  Skin:   1.5cm shallow ulcer with fibrinous exudate on superior to left medial malleolus. Right medial lower extremity superior to medial malleolus has patchy erythema, not blanching, not consistent with cellulitis.  Lymph nodes:   Cervical, supraclavicular, and axillary nodes normal  Neurologic:   CNII-XII intact. Moves arms and legs. Strength not tested. Sensation in tack   LABS: CBC    Component Value Date/Time   WBC 13.9* 07/01/2011 0520   RBC 4.62 07/01/2011 0520   HGB 14.6 07/01/2011 0520   HCT 42.4 07/01/2011 0520   PLT 329 07/01/2011 0520   MCV 91.8 07/01/2011 0520   MCH 31.6 07/01/2011 0520   MCHC 34.4 07/01/2011 0520   RDW 14.1 07/01/2011 0520   LYMPHSABS 1.3 06/30/2011 1351   MONOABS 0.7 06/30/2011 1351   EOSABS 0.1 06/30/2011 1351   BASOSABS 0.0 06/30/2011 1351    BMET    Component Value Date/Time   NA 140 07/01/2011 0520   K 4.0 07/01/2011 0520   CL 107 07/01/2011 0520   CO2 22 07/01/2011 0520   GLUCOSE 89 07/01/2011 0520   BUN 13 07/01/2011 0520   CREATININE 0.48* 07/01/2011 0520   CALCIUM 8.9 07/01/2011 0520    GFRNONAA >90 07/01/2011 0520   GFRAA >90 07/01/2011 0520     MICRO: mrsa pcr screen = negative   Assessment/Plan:  46yo Male presented with syncope 2/2 third degree heart block, but also found to have leukocytosis of 14, 85% neutrophils and poor healing small ulcer, started on vancomycin and piptazo concerning for cellulitis.  - recommend to stop piptazo and vancomycin, since his ulcer looks like it can be managed with topical treatment per wound care RN. Appearance and clinical presentation does not appear to be consistent with cellulitis. Will not need further oral antibiotics at this  time.  - patient will only need cefazolin as pre-op prophylaxis for pacemaker placement.   If any further questions, please do not hesitate to call.  Duke Salvia Drue Second MD MPH [facility] [phone]

## 2011-07-02 NOTE — Progress Notes (Signed)
    Subjective:  No further lightheadedness or syncope. No chest pain.  Objective:  Vital Signs in the last 24 hours: Temp:  [97.4 F (36.3 C)-98.4 F (36.9 C)] 97.4 F (36.3 C) (04/01 0435) Pulse Rate:  [57-75] 57  (04/01 0435) Resp:  [16-22] 17  (04/01 0435) BP: (117-130)/(68-86) 117/71 mmHg (04/01 0435) SpO2:  [91 %-96 %] 95 % (04/01 0435)  Intake/Output from previous day: 03/31 0701 - 04/01 0700 In: 1137.5 [P.O.:600; I.V.:50; IV Piggyback:487.5] Out: 3350 [Urine:3350]  Physical Exam: Pt is alert and oriented, NAD HEENT: normal Neck: JVP - normal, carotids 2+= without bruits Lungs: CTA bilaterally CV: RRR without murmur or gallop Abd: soft, NT, Positive BS, no hepatomegaly Ext: musculoskeletal deformity, clean ulcerations right lower leg  Lab Results:  Basename 07/01/11 0520 06/30/11 1533  WBC 13.9* 14.1*  HGB 14.6 16.1  PLT 329 192    Basename 07/01/11 0520 06/30/11 1533 06/30/11 1351  NA 140 -- 140  K 4.0 -- 4.8  CL 107 -- 106  CO2 22 -- 18*  GLUCOSE 89 -- 98  BUN 13 -- 12  CREATININE 0.48* 0.37* --   No results found for this basename: TROPONINI:2,CK,MB:2 in the last 72 hours  Tele: Sinus rhythm  Assessment/Plan:  1. Syncope with high-grade heart block. Plan permanent pacemaker today with Dr Graciela Husbands. 2. Nonhealing leg ulcers with normal ABI's. Pt has had leukocytosis without fever - this is improving. He is on IV antibiotics (Vanc/Zosyn). May need ID consult in this patient who requires permanent pacemaker for direction on antibiotic choice and duration.  Tonny Bollman, M.D. 07/02/2011, 7:57 AM

## 2011-07-02 NOTE — Progress Notes (Signed)
UR Completed. Simmons, Tasheika Kitzmiller F [phone]  

## 2011-07-02 NOTE — Progress Notes (Signed)
  Patient Name: William Hartman      SUBJECTIVE: history of progressively frequenmt syncope documented over the weekend to be assoc with intermittent complete heart block, not with any particularly noxious stimulus  Echo 11/11 normal LV funciton perhaps decreased RV function  Past Medical History  Diagnosis Date  . DDD (degenerative disc disease), lumbar   . HYPERLIPIDEMIA   . ARTHROGRYPOSIS MULTIPLEX CONGENITA   . SYNCOPE     micturition-related 2010, s/p cards eval  . HYPERTENSION   . ARTHRITIS   . Syncope and collapse     PT reported to EMS he has had multiple  episodes of  syncope.  Marland Kitchen Neuromuscular disorder   . Shortness of breath     PHYSICAL EXAM Filed Vitals:   07/02/11 0815 07/02/11 0900 07/02/11 1000 07/02/11 1100  BP: 115/73     Pulse: 67 63 61 74  Temp:      TempSrc:      Resp: 22 16 16 21   Height:      Weight:      SpO2: 95% 96% 95% 95%    General appearance: alert, cooperative and no distress Neck: no JVD Lungs: clear to auscultation bilaterally Heart: regular rate and rhythm, S1, S2 normal, no murmur, click, rub or gallop Extremities: shortened limbs with fixed contractures of his feet Skin: Skin color, texture, turgor normal.there are poorly healing ulcer post lecf leg and erythma near foot of right leg Neurologic:AX3  Little use of legs and min use of arms  TELEMETRY: Reviewed telemetry pt in NSR with doc intermitten complete heart block:    Intake/Output Summary (Last 24 hours) at 07/02/11 1140 Last data filed at 07/02/11 0900  Gross per 24 hour  Intake  927.5 ml  Output   3050 ml  Net -2122.5 ml    LABS: Basic Metabolic Panel:  Lab 07/01/11 6213 06/30/11 1533 06/30/11 1351  NA 140 -- 140  K 4.0 -- 4.8  CL 107 -- 106  CO2 22 -- 18*  GLUCOSE 89 -- 98  BUN 13 -- 12  CREATININE 0.48* 0.37* 0.38*  CALCIUM 8.9 -- 9.8  MG -- -- --  PHOS -- -- --   Cardiac Enzymes: No results found for this basename:  CKTOTAL:3,CKMB:3,CKMBINDEX:3,TROPONINI:3 in the last 72 hours CBC:  Lab 07/01/11 0520 06/30/11 1533 06/30/11 1351  WBC 13.9* 14.1* 14.0*  NEUTROABS -- -- 11.9*  HGB 14.6 16.1 16.1  HCT 42.4 45.4 46.5  MCV 91.8 90.4 92.3  PLT 329 192 245   PROTIME:  Basename 07/01/11 0520  LABPROT 14.7  INR 1.13     Basename 06/30/11 1533  TSH 1.241  T4TOTAL --  T3FREE --  THYROIDAB --   Anemia Panel: No results found for this basename: VITAMINB12,FOLATE,FERRITIN,TIBC,IRON,RETICCTPCT in the last 72 hours     ASSESSMENT AND PLAN:  Patient Active Hospital Problem List: ARTHROGRYPOSIS MULTIPLEX CONGENITA (06/29/2009)  is their a neuromuscular componet of this which might link to the heart block   Leukocytosis (01/31/2011) stabkle   Third degree heart block (06/30/2011)   Cellulitis and abscess of leg (06/30/2011)    Pt needs pacing for intermittent complete heart block The benefits and risks were reviewed including but not limited to death,  perforation, infection, lead dislodgement and device malfunction.  The patient understands agrees and is willing to proceed.   Signed, Sherryl Manges MD  07/02/2011

## 2011-07-02 NOTE — Op Note (Addendum)
Indication Complete Heart Block Post op diagnosis  same    After routine prep and drape, lidocaine was infiltrated in the prepectoral subclavicular region on theright  side an incision was made and carried down to later the prepectoral fascia using electrocautery and sharp dissection a pocket was formed similarly. Hemostasis was obtained.  After this, we turned our attention to gaining accessm to the extrathoracic,right subclavian vein. This was accomplished without difficulty and without the aspiration of air or puncture of the artery. 2 separate venipunctures were accomplished; guidewires were placed and retained and sequentially 7 French sheath were placed which were passed an medtronic 5076 SN PJN [id number] ventricular lead and an Medtronic 5076 atrial lead SN [id number] [id number]   The ventricular lead was manipulated to the right ventricular apex with a bipolar R wave was 6.2, the pacing impedance was 903, the threshold was 1.2/0.5 .  Current at threshold was   1.3 ma and the current of injury was brisk.  The right atrial lead was manipulated to the right atrial appendage with a bipolar P-wave was 6.4, the pacing impedance was1087, the threshold 1.5/0.5.  Current at threshold was 1.5 ma and the current of injury was brisk.  . The leads were affixed to the prepectoral fascia and attached to a Medtronic adapta L pulse generator S   [id number]  Hemostasis was obtained. The pocket was copiously irrigated with antibiotic containing saline solution. The leads and the pulse generator were placed in the pocket and affixed to the prepectoral fascia. The wound is then closed in 3 layers in normal fashion. The wound is washed dried and a benzoin Steri-Strip this was applied the account sponge counts and instrument counts were correct at the end of the procedure .Marland Kitchen The patient tolerated the procedure without apparent complication.  Duke Salvia M.D.   Cx: with initial positioning of the ventricular lead there  was complaint of pain, which I interpreted as microperforation  The lead was immediately repositioned with resolution of pain

## 2011-07-02 NOTE — Consult Note (Signed)
WOC consult Note Reason for Consult: eval wounds of the bil. LE.  Pt reports he has these areas to pop up on his legs over the last few years. Noted scarring from previous wounds present bil. LE.  Has one area to the RLE pretibial area, small and closed with scab.  LLE posterior wound at the achilles area that is open Wound type: unknown etiology, does not present venous or arterial.  Would recommend dermatology follow up for these lesions should they continue to be problematic on the outpatient basis Measurement: LLE posterior achilles area 1.0cm x 0.5cm x 0.2cm  Wound bed:100% yellow loose slough Drainage (amount, consistency, odor) minimal Periwound:intact without problems Dressing procedure/placement/frequency: will order enzymatic debridement ointment to clean up this open area, to be applied daily and covered with dry dressing.  Explained to pt he can continue this at home and cover with band aid until the wound is clean and moist.  And suggested to follow up with a dermatology to determine more clear etiology of these lesions.   Re consult if needed, will not follow at this time. Thanks  William Hartman [employer], CWOCN [phone])

## 2011-07-03 ENCOUNTER — Inpatient Hospital Stay (HOSPITAL_COMMUNITY): Payer: BC Managed Care – PPO

## 2011-07-03 DIAGNOSIS — I369 Nonrheumatic tricuspid valve disorder, unspecified: Secondary | ICD-10-CM

## 2011-07-03 MED ORDER — ONDANSETRON HCL 4 MG/2ML IJ SOLN: 4.0000 mg | Freq: Three times a day (TID) | INTRAMUSCULAR | Status: DC | PRN

## 2011-07-03 MED ORDER — COLLAGENASE 250 UNIT/GM EX OINT
TOPICAL_OINTMENT | Freq: Every day | CUTANEOUS | Status: DC
  Administered 2011-07-03: 1 via TOPICAL
  Administered 2011-07-04: 09:00:00 via TOPICAL
  Filled 2011-07-03: qty 30

## 2011-07-03 NOTE — Progress Notes (Signed)
Pt ambulated to door of room.  Still very weak, requiring 2 assist, however states he feels a little bit stronger than before.  Pt sat on side of bed to eat dinner with RN supervision.  Report called to 2000 RN.  Will transfer on monitor.

## 2011-07-03 NOTE — Progress Notes (Signed)
Walked pt to sink in room.  Pt extremely weak, requiring 2 assist. Pt states that this is much worse than he normally is at home.  Theodore Demark, NP notified and decision was made to keep pt in hospital.

## 2011-07-03 NOTE — Progress Notes (Signed)
  Echocardiogram 2D Echocardiogram has been performed.  William Hartman A 07/03/2011, 4:12 PM

## 2011-07-03 NOTE — Progress Notes (Signed)
ANTIBIOTIC CONSULT NOTE - INITIAL  Pharmacy Consult for Vancomycin and Zosyn Indication: Right Leg Cellulitis  No Known Allergies  Patient Measurements: Height: 5\' 6"  (167.6 cm) Weight: 184 lb 1.4 oz (83.5 kg) IBW/kg (Calculated) : 63.8 Weight: 86.18 kg (last documented 06/21/11) No Height available.   Vital Signs: Temp: 97.5 F (36.4 C) (04/02 0850) Temp src: Oral (04/02 0850) BP: 124/79 mmHg (04/02 0850) Pulse Rate: 75  (04/02 0850) Intake/Output from previous day: 04/01 0701 - 04/02 0700 In: 1365 [P.O.:170; I.V.:520; IV Piggyback:675] Out: 1101 [Urine:1100; Stool:1] Intake/Output from this shift: Total I/O In: 430 [P.O.:300; I.V.:130] Out: -   Labs:  Basename 07/01/11 0520 06/30/11 1533 06/30/11 1351  WBC 13.9* 14.1* 14.0*  HGB 14.6 16.1 16.1  PLT 329 192 245  LABCREA -- -- --  CREATININE 0.48* 0.37* 0.38*   Estimated Creatinine Clearance: 119.5 ml/min (by C-G formula based on Cr of 0.48). No results found for this basename: VANCOTROUGH:2,VANCOPEAK:2,VANCORANDOM:2,GENTTROUGH:2,GENTPEAK:2,GENTRANDOM:2,TOBRATROUGH:2,TOBRAPEAK:2,TOBRARND:2,AMIKACINPEAK:2,AMIKACINTROU:2,AMIKACIN:2, in the last 72 hours   Microbiology: Recent Results (from the past 720 hour(s))  MRSA PCR SCREENING     Status: Normal   Collection Time   06/30/11  4:32 PM      Component Value Range Status Comment   MRSA by PCR NEGATIVE  NEGATIVE  Final     Medical History: Past Medical History  Diagnosis Date  . DDD (degenerative disc disease), lumbar   . HYPERLIPIDEMIA   . ARTHROGRYPOSIS MULTIPLEX CONGENITA   . SYNCOPE     micturition-related 2010, s/p cards eval  . HYPERTENSION   . ARTHRITIS   . Syncope and collapse     PT reported to EMS he has had multiple  episodes of  syncope.  Marland Kitchen Neuromuscular disorder   . Shortness of breath     Medications:  Anti-infectives     Start     Dose/Rate Route Frequency Ordered Stop   07/02/11 1045   gentamicin (GARAMYCIN) 80 mg in sodium chloride  irrigation 0.9 % 500 mL irrigation  Status:  Discontinued        80 mg Irrigation On call 07/02/11 1030 07/02/11 1349   06/30/11 2200   vancomycin (VANCOCIN) IVPB 1000 mg/200 mL premix        1,000 mg 200 mL/hr over 60 Minutes Intravenous Every 8 hours 06/30/11 1700     06/30/11 2200   piperacillin-tazobactam (ZOSYN) IVPB 3.375 g        3.375 g 12.5 mL/hr over 240 Minutes Intravenous 3 times per day 06/30/11 1700     06/30/11 1545   vancomycin (VANCOCIN) IVPB 1000 mg/200 mL premix        1,000 mg 200 mL/hr over 60 Minutes Intravenous NOW 06/30/11 1536 06/30/11 1703   06/30/11 1545   piperacillin-tazobactam (ZOSYN) IVPB 3.375 g        3.375 g 100 mL/hr over 30 Minutes Intravenous NOW 06/30/11 1536 06/30/11 1838         Assessment: 46 year old male with non-healing BLE ulcers and right leg cellulitis on IV Vancomycin and Zosyn. WBC decreasing, Afebrile.  S/P pacemaker placement, and renal function stable.  ID recommends topical treatment of ulcers only.  Goal of Therapy:  Vancomycin trough level 10-15 mcg/ml  Plan:  1. Continue Vancomycin 1g IV q8h and  Zosyn 3.375g IV q8 hrs. 2. Consider d/c antibiotics soon.  Reece Leader, Pharm D 07/03/2011 11:30 AM

## 2011-07-03 NOTE — Progress Notes (Addendum)
  Patient Name: William Hartman      SUBJECTIVE:no significant discomfort  Past Medical History  Diagnosis Date  . DDD (degenerative disc disease), lumbar   . HYPERLIPIDEMIA   . ARTHROGRYPOSIS MULTIPLEX CONGENITA   . SYNCOPE     micturition-related 2010, s/p cards eval  . HYPERTENSION   . ARTHRITIS   . Syncope and collapse     PT reported to EMS he has had multiple  episodes of  syncope.  Marland Kitchen Neuromuscular disorder   . Shortness of breath     PHYSICAL EXAM Filed Vitals:   07/02/11 1948 07/02/11 1950 07/02/11 2355 07/03/11 0515  BP:  119/79 117/86 113/75  Pulse:  73 62 64  Temp: 98.7 F (37.1 C)  97.8 F (36.6 C) 97.4 F (36.3 C)  TempSrc: Oral  Oral Oral  Resp:  20 17 17   Height:      Weight:      SpO2:  92% 91% 92%    Well developed and nourished in no acute distress HENT normal Neck supple with JVP-flat Clear Regular rate and rhythm, no murmurs or gallops Device pocket without hematoma Abd-soft with active BS No Clubbing cyanosis edema Skin-warm and dry A & Oriented  Hyperextended and frozen joints   TELEMETRY: Reviewed telemetry pt in atrial pacing:    Intake/Output Summary (Last 24 hours) at 07/03/11 0734 Last data filed at 07/03/11 0600  Gross per 24 hour  Intake   1365 ml  Output   1101 ml  Net    264 ml    LABS: Basic Metabolic Panel:  Lab 07/01/11 1610 06/30/11 1533 06/30/11 1351  NA 140 -- 140  K 4.0 -- 4.8  CL 107 -- 106  CO2 22 -- 18*  GLUCOSE 89 -- 98  BUN 13 -- 12  CREATININE 0.48* 0.37* 0.38*  CALCIUM 8.9 -- 9.8  MG -- -- --  PHOS -- -- --   Cardiac Enzymes: No results found for this basename: CKTOTAL:3,CKMB:3,CKMBINDEX:3,TROPONINI:3 in the last 72 hours CBC:  Lab 07/01/11 0520 06/30/11 1533 06/30/11 1351  WBC 13.9* 14.1* 14.0*  NEUTROABS -- -- 11.9*  HGB 14.6 16.1 16.1  HCT 42.4 45.4 46.5  MCV 91.8 90.4 92.3  PLT 329 192 245   PROTIME:  Basename 07/01/11 0520  LABPROT 14.7  INR 1.13     Basename 06/30/11 1533    TSH 1.241  T4TOTAL --  T3FREE --  THYROIDAB --   Anemia Panel: No results found for this basename: VITAMINB12,FOLATE,FERRITIN,TIBC,IRON,RETICCTPCT in the last 72 hours   Device Interrogation: stable   ASSESSMENT AND PLAN:  Patient Active Hospital Problem List: ARTHROGRYPOSIS MULTIPLEX CONGENITA (06/29/2009)  * SYNCOPE (06/28/2009)   Third degree heart block (06/30/2011)   Cellulitis and abscess of leg (06/30/2011)    S/p pacer stalbe parameters  Plan  D/c today Cancel appt JA next week Wound check Keep appt BC may 13 sk 3 months    Signed, Sherryl Manges MD  07/03/2011  Some nausea this am   Will give zofran and make sure he can eat prior to discharge

## 2011-07-03 NOTE — Progress Notes (Signed)
Pt very weak today. Per RN required assist x2 to ambulate. Will keep till am and have PT to see. Tx Telemetry.

## 2011-07-04 MED ORDER — SODIUM CHLORIDE 0.9 % IJ SOLN
3.0000 mL | Freq: Two times a day (BID) | INTRAMUSCULAR | Status: DC
  Administered 2011-07-04: 3 mL via INTRAVENOUS

## 2011-07-04 NOTE — Discharge Summary (Signed)
ELECTROPHYSIOLOGY PROCEDURE DISCHARGE SUMMARY    Patient ID: William Hartman,  MRN: [id number], DOB/AGE: Aug 13, 1965 46 y.o.  Admit date: 06/30/2011 Discharge date: 07/04/2011  Primary Cardiologist: Olga Millers, MD Electrophysiologist: Sherryl Manges, MD  Primary Discharge Diagnosis:  Syncope and intermittent complete heart block status post pacemaker implant this admission  Secondary Discharge Diagnosis:  1.  Degenerative disc disease 2.  Hyperlipidemia 3.  Arthrogryposis multiplex cogenita 4.  Hypertension 5.  Arthritis  Procedures This Admission:  1.  Implantation of a dual chamber pacemaker on 07-02-2011 by Dr Graciela Husbands.  The patient received a Medtronic model number [id number] pacemaker with model number 5076 right atrial and right ventricular leads.  There were no early immediate complications. 2.  Echocardiogram on 07-03-2011 demonstrated an EF of 60-65% with no RWMA and a trivial pericardial effusion. 3.  CXR on 07-03-2011 demonstrated no pneumothorax status post device implantation.   Brief HPI: William Hartman is a 46 year old male with a history of syncope and arthrogryposis multiplex congenita.  He called EMS on the day of admission for recurrent syncope.  EMS strips showed high grade heart block and he was admitted for further evaluation.    Hospital Course:  The patient was admitted and monitored on telemetry.  He was evaluated by Dr Graciela Husbands who felt that pacemaker placement was warranted.  Risks, benefits, and alternatives were discussed with the patient who wished to proceed.  He was monitored on telemetry which demonstrated sinus rhythm with intermittent pacing.  His device was interrogated and found to be functioning normally.  Because of some weakness post procedure felt to be related to hospital stay, he was kept until 4-3.  On 4-3, Dr Graciela Husbands examined the patient and considered him stable for discharge to home.   Discharge Vitals: Blood pressure 118/78, pulse 81, temperature 97.8 F  (36.6 C), temperature source Oral, resp. rate 20, height 5\' 6"  (1.676 m), weight 184 lb 1.4 oz (83.5 kg), SpO2 92.00%.    Labs:   Lab Results  Component Value Date   WBC 13.9* 07/01/2011   HGB 14.6 07/01/2011   HCT 42.4 07/01/2011   MCV 91.8 07/01/2011   PLT 329 07/01/2011    Lab 07/01/11 0520  NA 140  K 4.0  CL 107  CO2 22  BUN 13  CREATININE 0.48*  CALCIUM 8.9  PROT --  BILITOT --  ALKPHOS --  ALT --  AST --  GLUCOSE 89   Lab Results  Component Value Date   CKTOTAL 218 06/01/2009   CKMB 3.2 06/01/2009   TROPONINI  Value: 0.01        NO INDICATION OF MYOCARDIAL INJURY. 06/01/2009    Lab Results  Component Value Date   CHOL 162 12/28/2010   CHOL 141 06/30/2010   CHOL 220* 03/28/2010   Lab Results  Component Value Date   HDL 46.10 12/28/2010   HDL 40.98 06/30/2010   HDL 11.91* 03/28/2010   Lab Results  Component Value Date   LDLCALC 87 12/28/2010   LDLCALC 74 06/30/2010   LDLCALC  Value: 139        Total Cholesterol/HDL:CHD Risk Coronary Heart Disease Risk Table                     Men   Women  1/2 Average Risk   3.4   3.3  Average Risk       5.0   4.4  2 X Average Risk   9.6  7.1  3 X Average Risk  23.4   11.0        Use the calculated Patient Ratio above and the CHD Risk Table to determine the patient's CHD Risk.        ATP III CLASSIFICATION (LDL):  <100     mg/dL   Optimal  409-811  mg/dL   Near or Above                    Optimal  130-159  mg/dL   Borderline  914-782  mg/dL   High  >956     mg/dL   Very High* 05/03/3084   Lab Results  Component Value Date   TRIG 144.0 12/28/2010   TRIG 108.0 06/30/2010   TRIG 279.0* 03/28/2010   Lab Results  Component Value Date   CHOLHDL 4 12/28/2010   CHOLHDL 3 06/30/2010   CHOLHDL 6 03/28/2010   Lab Results  Component Value Date   LDLDIRECT 153.0 03/28/2010   No results found for this basename: DDIMER     Discharge Medications:  Medication List  As of 07/04/2011 11:17 AM   TAKE these medications         ibuprofen 200 MG  tablet   Commonly known as: ADVIL,MOTRIN   Take 200 mg by mouth every 6 (six) hours as needed. For pain      losartan 50 MG tablet   Commonly known as: COZAAR   Take 50 mg by mouth at bedtime.      simvastatin 10 MG tablet   Commonly known as: ZOCOR   Take 10 mg by mouth at bedtime.      Vitamin B-12 2500 MCG Subl   Place 1 tablet under the tongue at bedtime.            Disposition:  Discharge Orders    Future Appointments: Provider: Department: Dept Phone: Center:   07/12/2011 11:00 AM [facility] [place] [phone] [facility]   08/16/2011 12:30 PM Lewayne Bunting, MD [facility] [phone] [facility]     Future Orders Please Complete By Expires   Diet - low sodium heart healthy      Increase activity slowly      Comments:   Please see attached sheet for instructions on wound care, activity, and bathing.       Follow-up Information    Follow up with Rene Paci, MD in 2 weeks. (As needed)       Follow up with [facility] (Wound Check April 11th at 11 am.)    Contact information:   [address] [place] [address]       Follow up with Sherryl Manges, MD. (See in 3 months, the office will call.)    Contact information:   [address], [address] [place] [address] [phone]          Duration of Discharge Encounter: Greater than 30 minutes including physician time.  Signed, Gypsy Balsam, RN, BSN 07/04/2011, 11:17 AM

## 2011-07-04 NOTE — Progress Notes (Signed)
   ELECTROPHYSIOLOGY ROUNDING NOTE    Patient Name: William Hartman Date of Encounter: 07-04-2011    SUBJECTIVE:Patient feels better today, stronger.  No chest pain or shortness of breath.  Minimal incisional soreness.  Status post dual chamber pacemaker implant 07-02-2011.   TELEMETRY: Reviewed telemetry pt in sinus rhythm with intermittent pacing. Filed Vitals:   07/03/11 1635 07/03/11 1758 07/03/11 2029 07/04/11 0639  BP: 99/68 104/72 118/80 118/78  Pulse: 89 85 81 81  Temp: 98.2 F (36.8 C) 97.4 F (36.3 C) 97.9 F (36.6 C) 97.8 F (36.6 C)  TempSrc: Oral Oral Oral Oral  Resp:  20 20 20   Height:      Weight:      SpO2: 93% 91% 92% 92%    Intake/Output Summary (Last 24 hours) at 07/04/11 1005 Last data filed at 07/04/11 0600  Gross per 24 hour  Intake   1170 ml  Output   1850 ml  Net   -680 ml     PHYSICAL EXAM  Chest clear RRR without murmur Abd soft with active BS No edema  Left chest without hematoma or ecchymosis, Tegaderm removed.   Wound care, arm mobility reviewed with patient.    Awaiting evaluation by PT.   Pt feels as though he is safe to go home independently.   Discharge plans p [place] complete heart block s/p PM

## 2011-07-04 NOTE — Discharge Instructions (Signed)
   Supplemental Discharge Instructions for  Pacemaker Patients  Activity No heavy lifting or vigorous activity with your left/right arm for 6 to 8 weeks.  Do not raise your left/right arm above your head for one week.  Gradually raise your affected arm as drawn below.                     April 4th                April 5th                     April 6th                 April 7th    NO DRIVING for  1 week    ; you may begin driving on      April 8th    . WOUND CARE   Keep the wound area clean and dry.  Do not get this area wet for one week. No showers for one week; you may shower on       April 8th       .   The tape/steri-strips on your wound will fall off; do not pull them off.  No bandage is needed on the site.  DO  NOT apply any creams, oils, or ointments to the wound area.   If you notice any drainage or discharge from the wound, any swelling or bruising at the site, or you develop a fever > 101? F after you are discharged home, call the office at once.  Special Instructions   You are still able to use cellular telephones; use the ear opposite the side where you have your pacemaker/defibrillator.  Avoid carrying your cellular phone near your device.   When traveling through airports, show security personnel your identification card to avoid being screened in the metal detectors.  Ask the security personnel to use the hand wand.   Avoid arc welding equipment, MRI testing (magnetic resonance imaging), TENS units (transcutaneous nerve stimulators).  Call the office for questions about other devices.   Avoid electrical appliances that are in poor condition or are not properly grounded.   Microwave ovens are safe to be near or to operate.

## 2011-07-04 NOTE — Progress Notes (Signed)
Pt given discharge instructions, follow up appointments, and medication list. Pt verbalizes understanding of discharge instructions. Desmond Dike [place]

## 2011-07-06 ENCOUNTER — Telehealth: Payer: Self-pay | Admitting: Cardiology

## 2011-07-06 NOTE — Telephone Encounter (Signed)
New msg Pt was calling about status of paperwork that was dropped off last week. Please call

## 2011-07-06 NOTE — Telephone Encounter (Signed)
Spoke with pt, according to notes from [employer] paperwork was mailed to pt to complete. Per pt he has not received yet. Aware once paperwork complete and brought back they will be able to complete FMLA.

## 2011-07-10 ENCOUNTER — Telehealth: Payer: Self-pay | Admitting: Internal Medicine

## 2011-07-10 NOTE — Telephone Encounter (Signed)
Spoke with pt. He reports episodes of feeling hot and then cold. Was warm last night with blankets on and then took them off and felt cold.No complaints of this at present time.  Also has stuffy nose.  He does not have thermometer so I asked him to buy one so he could monitor temp.  Pacer site looks fine. He is aware to call us if site has swelling, redness or drainage or if temp elevated.  He is asking what he can take for symptoms.  I told him anti histamines were OK to take but that he should avoid decongestants.  He will contact primary care if symptoms worsen.  He is aware of appt with pacer clinic on July 12, 2011.

## 2011-07-10 NOTE — Telephone Encounter (Signed)
Pt is hot and cold and hot and cold no pain or any other symptoms but he wants to know what he needs to do

## 2011-07-12 ENCOUNTER — Ambulatory Visit (INDEPENDENT_AMBULATORY_CARE_PROVIDER_SITE_OTHER): Payer: BC Managed Care – PPO | Admitting: *Deleted

## 2011-07-12 ENCOUNTER — Encounter: Payer: Self-pay | Admitting: Internal Medicine

## 2011-07-12 ENCOUNTER — Encounter: Payer: BC Managed Care – PPO | Admitting: Internal Medicine

## 2011-07-12 DIAGNOSIS — I442 Atrioventricular block, complete: Secondary | ICD-10-CM

## 2011-07-12 LAB — PACEMAKER DEVICE OBSERVATION
AL IMPEDENCE PM: 478 Ohm
AL THRESHOLD: 0.75 V
ATRIAL PACING PM: 0
BAMS-0001: 150 {beats}/min
BATTERY VOLTAGE: 2.79 V
RV LEAD AMPLITUDE: 11.2 mv

## 2011-07-12 NOTE — Progress Notes (Signed)
Wound check-PPM 

## 2011-07-24 ENCOUNTER — Encounter: Payer: Self-pay | Admitting: Internal Medicine

## 2011-07-24 ENCOUNTER — Ambulatory Visit (INDEPENDENT_AMBULATORY_CARE_PROVIDER_SITE_OTHER): Payer: BC Managed Care – PPO | Admitting: Internal Medicine

## 2011-07-24 ENCOUNTER — Other Ambulatory Visit (INDEPENDENT_AMBULATORY_CARE_PROVIDER_SITE_OTHER): Payer: BC Managed Care – PPO

## 2011-07-24 VITALS — BP 110/84 | HR 109 | Temp 97.6°F | Ht 66.0 in | Wt 184.8 lb

## 2011-07-24 DIAGNOSIS — R350 Frequency of micturition: Secondary | ICD-10-CM | POA: Insufficient documentation

## 2011-07-24 DIAGNOSIS — I10 Essential (primary) hypertension: Secondary | ICD-10-CM

## 2011-07-24 DIAGNOSIS — I442 Atrioventricular block, complete: Secondary | ICD-10-CM

## 2011-07-24 LAB — PSA: PSA: 2.14 ng/mL (ref 0.10–4.00)

## 2011-07-24 LAB — URINALYSIS
Bilirubin Urine: NEGATIVE
Leukocytes, UA: NEGATIVE
Nitrite: NEGATIVE
Urobilinogen, UA: 0.2 (ref 0.0–1.0)

## 2011-07-24 LAB — BASIC METABOLIC PANEL
Calcium: 9.7 mg/dL (ref 8.4–10.5)
Creatinine, Ser: 1.1 mg/dL (ref 0.4–1.5)
Sodium: 140 mEq/L (ref 135–145)

## 2011-07-24 NOTE — Assessment & Plan Note (Signed)
On ARB but consider change to beta-blocker as discussed below for tachycardia spells (exertional) No cahnge recommended by me - defer to cards as BP well controlled on current tx BP Readings from Last 3 Encounters:  07/24/11 110/84  07/04/11 123/86  07/04/11 123/86

## 2011-07-24 NOTE — Assessment & Plan Note (Signed)
Recurrent dizzy events and syncope - ER eval 06/2011 identified problem S/p PPM 07/02/11 - Reviewed same - to follow with cards - ?change ARB to BB to control tachy spells (though asymptomatic) - advised pt to discuss with cards if needed

## 2011-07-24 NOTE — Patient Instructions (Signed)
It was good to see you today. We have reviewed your prior records including labs and tests today Test(s) ordered today. Your results will be called to you after review (48-72hours after test completion). If any changes need to be made, you will be notified at that time. Medications reviewed, no changes at this time. Please schedule followup in 6 months for blood pressure and cholesterol check, call sooner if problems.

## 2011-07-24 NOTE — Progress Notes (Signed)
Subjective:    Patient ID: William Hartman, male    DOB: 24-Feb-1966, 46 y.o.   MRN: [id number]  Urinary Frequency  Associated symptoms include frequency. Pertinent negatives include no flank pain or hematuria.   Also here for followup - reviewed chronic medical issues today:  3rd degree AVB - dx 06/2011 after recurrent syncope and dizzy events - ER eval and PPM placed 4/1/3 - follows with cards for same -  HTN - reports compliance with ongoing medical treatment and no changes in medication dose or frequency. denies adverse side effects related to current therapy. No CP or edema, no HA  dyslipidemia -resumed statin 03/2010 - the patient reports compliance with medication(s) as prescribed. Denies adverse side effects.  AMC (arthrogryposis multiplex congenita) - mulitple surg for same as child - stable symptoms - able to walk indep since back injections for DDD in 2010 - follows regularly with ortho for same (voytek) - still working on new WC rx for "leverage ride" to have front control access rather than manual use of arms/hands - recent flare of right leg/hip/back pain but reluctant to repeat shot series  Past Medical History  Diagnosis Date  . DDD (degenerative disc disease), lumbar   . HYPERLIPIDEMIA   . ARTHROGRYPOSIS MULTIPLEX CONGENITA   . SYNCOPE     micturition-related 2010, s/p cards eval  . HYPERTENSION   . ARTHRITIS   . Syncope and collapse     PT reported to EMS he has had multiple  episodes of  syncope.  Marland Kitchen Neuromuscular disorder   . Shortness of breath    Review of Systems  Constitutional: Positive for fatigue. Negative for fever.  Respiratory: Negative for cough.   Cardiovascular: Negative for chest pain.  Genitourinary: Positive for frequency. Negative for dysuria, hematuria and flank pain.  Neurological: Negative for dizziness and syncope.      Objective:   Physical Exam  BP 110/84  Pulse 109  Temp(Src) 97.6 F (36.4 C) (Oral)  Ht 5\' 6"  (1.676 m)  Wt 184 lb  12.8 oz (83.825 kg)  BMI 29.83 kg/m2  SpO2 94% Wt Readings from Last 3 Encounters:  07/24/11 184 lb 12.8 oz (83.825 kg)  06/30/11 184 lb 1.4 oz (83.5 kg)  06/30/11 184 lb 1.4 oz (83.5 kg)   Constitutional: He appears well-nourished. No distress. 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 No murmur or rub heard. No BLE edema Pulmonary/Chest: Effort normal and breath sounds normal. No respiratory distress. He has no wheezes.  Neurological: he is alert and oriented to person, place, and time. No cranial nerve deficit. Balance and coordination normal/baseline - Gait affected by MSKel shortening (chronic). strength 5/5 Psychiatric: he has a normal mood and affect. behavior is normal. Judgment and thought content normal.       Lab Results  Component Value Date   WBC 13.9* 07/01/2011   HGB 14.6 07/01/2011   HCT 42.4 07/01/2011   PLT 329 07/01/2011   CHOL 162 12/28/2010   TRIG 144.0 12/28/2010   HDL 46.10 12/28/2010   LDLDIRECT 153.0 03/28/2010   ALT 43 12/28/2010   AST 35 12/28/2010   NA 140 07/01/2011   K 4.0 07/01/2011   CL 107 07/01/2011   CREATININE 0.48* 07/01/2011   BUN 13 07/01/2011   CO2 22 07/01/2011   TSH 1.241 06/30/2011   PSA 1.24 03/28/2010   INR 1.13 07/01/2011   HGBA1C  Value: 5.6 (NOTE)   The  ADA recommends the following therapeutic goal for glycemic   control related to Hgb A1C measurement:   Goal of Therapy:   < 7.0% Hgb A1C   Reference: American Diabetes Association: Clinical Practice   Recommendations 2008, Diabetes Care,  2008, 31:(Suppl 1). 03/31/2008   Assessment & Plan:  See problem list. Medications and labs reviewed today.

## 2011-07-24 NOTE — Assessment & Plan Note (Signed)
Check UA, Cr and PSA Consider tx BPH if no infection

## 2011-07-26 ENCOUNTER — Inpatient Hospital Stay (HOSPITAL_COMMUNITY)
Admission: EM | Admit: 2011-07-26 | Discharge: 2011-07-27 | DRG: 832 | Disposition: A | Discharge status: Home / Self Care | Payer: BC Managed Care – PPO | Attending: Internal Medicine | Admitting: Internal Medicine

## 2011-07-26 ENCOUNTER — Encounter (HOSPITAL_COMMUNITY): Payer: Self-pay | Admitting: Emergency Medicine

## 2011-07-26 ENCOUNTER — Inpatient Hospital Stay (HOSPITAL_COMMUNITY): Payer: BC Managed Care – PPO

## 2011-07-26 ENCOUNTER — Emergency Department (HOSPITAL_COMMUNITY): Payer: BC Managed Care – PPO

## 2011-07-26 DIAGNOSIS — D72829 Elevated white blood cell count, unspecified: Secondary | ICD-10-CM | POA: Diagnosis present

## 2011-07-26 DIAGNOSIS — M51379 Other intervertebral disc degeneration, lumbosacral region without mention of lumbar back pain or lower extremity pain: Secondary | ICD-10-CM | POA: Diagnosis present

## 2011-07-26 DIAGNOSIS — R42 Dizziness and giddiness: Secondary | ICD-10-CM | POA: Diagnosis present

## 2011-07-26 DIAGNOSIS — Z8673 Personal history of transient ischemic attack (TIA), and cerebral infarction without residual deficits: Secondary | ICD-10-CM

## 2011-07-26 DIAGNOSIS — I442 Atrioventricular block, complete: Secondary | ICD-10-CM | POA: Diagnosis present

## 2011-07-26 DIAGNOSIS — Q688 Other specified congenital musculoskeletal deformities: Secondary | ICD-10-CM

## 2011-07-26 DIAGNOSIS — M5137 Other intervertebral disc degeneration, lumbosacral region: Secondary | ICD-10-CM | POA: Diagnosis present

## 2011-07-26 DIAGNOSIS — I10 Essential (primary) hypertension: Secondary | ICD-10-CM | POA: Diagnosis present

## 2011-07-26 DIAGNOSIS — G459 Transient cerebral ischemic attack, unspecified: Secondary | ICD-10-CM

## 2011-07-26 DIAGNOSIS — R4789 Other speech disturbances: Secondary | ICD-10-CM | POA: Diagnosis present

## 2011-07-26 DIAGNOSIS — E785 Hyperlipidemia, unspecified: Secondary | ICD-10-CM | POA: Diagnosis present

## 2011-07-26 DIAGNOSIS — M129 Arthropathy, unspecified: Secondary | ICD-10-CM | POA: Diagnosis present

## 2011-07-26 DIAGNOSIS — F809 Developmental disorder of speech and language, unspecified: Secondary | ICD-10-CM | POA: Diagnosis present

## 2011-07-26 HISTORY — DX: Atrioventricular block, complete: I44.2

## 2011-07-26 LAB — BASIC METABOLIC PANEL
Chloride: 105 mEq/L (ref 96–112)
Creatinine, Ser: 1.11 mg/dL (ref 0.50–1.35)
GFR calc Af Amer: >90 mL/min (ref >90)
Potassium: 4 mEq/L (ref 3.5–5.1)
Sodium: 139 mEq/L (ref 135–145)

## 2011-07-26 LAB — DIFFERENTIAL
Basophils Absolute: 0.1 10*3/uL (ref 0.0–0.1)
Basophils Relative: 1 % (ref 0–1)
Monocytes Absolute: 1.3 10*3/uL — ABNORMAL HIGH (ref 0.1–1.0)
Neutro Abs: 10.3 10*3/uL — ABNORMAL HIGH (ref 1.7–7.7)
Neutrophils Relative %: 71 % (ref 43–77)

## 2011-07-26 LAB — GLUCOSE, CAPILLARY: Glucose-Capillary: 104 mg/dL — ABNORMAL HIGH (ref 70–99)

## 2011-07-26 LAB — CBC
MCHC: 34.4 g/dL (ref 30.0–36.0)
Platelets: 415 10*3/uL — ABNORMAL HIGH (ref 150–400)
RDW: 13.5 % (ref 11.5–15.5)
WBC: 14.6 10*3/uL — ABNORMAL HIGH (ref 4.0–10.5)

## 2011-07-26 LAB — RAPID URINE DRUG SCREEN, HOSP PERFORMED
Opiates: NOT DETECTED
Tetrahydrocannabinol: NOT DETECTED

## 2011-07-26 LAB — URINE MICROSCOPIC-ADD ON

## 2011-07-26 LAB — URINALYSIS, ROUTINE W REFLEX MICROSCOPIC
Bilirubin Urine: NEGATIVE
Glucose, UA: NEGATIVE mg/dL
Specific Gravity, Urine: 1.013 (ref 1.005–1.030)
Urobilinogen, UA: 0.2 mg/dL (ref 0.0–1.0)
pH: 5.5 (ref 5.0–8.0)

## 2011-07-26 MED ORDER — HYDRALAZINE HCL 20 MG/ML IJ SOLN
10.0000 mg | Freq: Three times a day (TID) | INTRAMUSCULAR | Status: DC | PRN
  Filled 2011-07-26: qty 0.5

## 2011-07-26 MED ORDER — SODIUM CHLORIDE 0.9 % IV SOLN
INTRAVENOUS | Status: DC
  Administered 2011-07-27: 50 mL via INTRAVENOUS

## 2011-07-26 MED ORDER — VITAMIN B-12 2500 MCG SL SUBL: 1.0000 | SUBLINGUAL_TABLET | Freq: Every day | SUBLINGUAL | Status: DC

## 2011-07-26 MED ORDER — SIMVASTATIN 10 MG PO TABS
10.0000 mg | ORAL_TABLET | Freq: Every day | ORAL | Status: DC
  Administered 2011-07-26: 10 mg via ORAL
  Filled 2011-07-26 (×2): qty 1

## 2011-07-26 MED ORDER — ASPIRIN 325 MG PO TABS
325.0000 mg | ORAL_TABLET | Freq: Every day | ORAL | Status: DC
  Administered 2011-07-26 – 2011-07-27 (×2): 325 mg via ORAL
  Filled 2011-07-26 (×2): qty 1

## 2011-07-26 MED ORDER — VITAMIN B-12 1000 MCG PO TABS
2500.0000 ug | ORAL_TABLET | Freq: Every day | ORAL | Status: DC
  Administered 2011-07-26: 2500 ug via ORAL
  Filled 2011-07-26 (×2): qty 1

## 2011-07-26 MED ORDER — SODIUM CHLORIDE 0.9 % IV SOLN
INTRAVENOUS | Status: AC
  Administered 2011-07-26: 125 mL/h via INTRAVENOUS

## 2011-07-26 MED ORDER — ACETAMINOPHEN 325 MG PO TABS: 650.0000 mg | ORAL_TABLET | ORAL | Status: DC | PRN

## 2011-07-26 NOTE — ED Notes (Signed)
Attempted to call report but receiving RN is unavailable at present

## 2011-07-26 NOTE — ED Provider Notes (Signed)
History     CSN: [id number]  Arrival date & time 07/26/11  1133   First MD Initiated Contact with Patient 07/26/11 1141      Chief Complaint  Patient presents with  . Transient Ischemic Attack    (Consider location/radiation/quality/duration/timing/severity/associated sxs/prior treatment) Patient is a 46 y.o. male presenting with neurologic complaint. The history is provided by the patient. No language interpreter was used.  Neurologic Problem The primary symptoms include dizziness and speech change. Primary symptoms do not include headaches, syncope, loss of consciousness, altered mental status, seizures, visual change, paresthesias, focal weakness, loss of sensation, memory loss, fever, nausea or vomiting. The symptoms began less than 1 hour ago. The episode lasted 10 minutes. The symptoms are resolved. The neurological symptoms are focal. Context: N/A.  Dizziness does not occur with tinnitus, nausea, vomiting or weakness.   Additional symptoms do not include neck stiffness, weakness, pain, loss of balance, hearing loss, tinnitus or vertigo. Medical issues do not include seizures or cerebral vascular accident. Workup history does not include MRI.    Past Medical History  Diagnosis Date  . DDD (degenerative disc disease), lumbar   . HYPERLIPIDEMIA   . ARTHROGRYPOSIS MULTIPLEX CONGENITA   . SYNCOPE     micturition-related 2010, s/p cards eval  . HYPERTENSION   . ARTHRITIS   . Syncope and collapse     PT reported to EMS he has had multiple  episodes of  syncope.  Marland Kitchen Neuromuscular disorder   . Shortness of breath     Past Surgical History  Procedure Date  . Arthrogryposis multiplex congentia     with multiple foot/ankle surgers as a child  . Right knee surgery     ateral knee mass excision/bilat [id number] left    Family History  Problem Relation Age of Onset  . Adopted: Yes  . Lung cancer Mother   . Breast cancer Mother     History  Substance Use Topics  . Smoking  status: Never Smoker   . Smokeless tobacco: Not on file  . Alcohol Use: 0.6 oz/week    1 Glasses of wine per week      Review of Systems  Constitutional: Negative for fever and chills.  HENT: Negative for hearing loss, neck pain, neck stiffness and tinnitus.   Eyes: Negative for visual disturbance.  Respiratory: Negative for cough, chest tightness and shortness of breath.   Cardiovascular: Negative for chest pain, palpitations, leg swelling and syncope.  Gastrointestinal: Negative for nausea, vomiting, abdominal pain, diarrhea, constipation, blood in stool and abdominal distention.  Genitourinary: Negative for dysuria, urgency, hematuria and difficulty urinating.  Musculoskeletal: Negative for back pain and gait problem.  Skin: Negative for rash.  Neurological: Positive for dizziness, speech change and speech difficulty. Negative for vertigo, tremors, focal weakness, seizures, loss of consciousness, syncope, facial asymmetry, weakness, light-headedness, numbness, headaches, paresthesias and loss of balance.  Hematological: Negative for adenopathy. Does not bruise/bleed easily.  Psychiatric/Behavioral: Negative for memory loss, confusion and altered mental status.    Allergies  Review of patient's allergies indicates no known allergies.  Home Medications   Current Outpatient Rx  Name Route Sig Dispense Refill  . VITAMIN B-12 2500 MCG SL SUBL Sublingual Place 1 tablet under the tongue at bedtime.     . IBUPROFEN 200 MG PO TABS Oral Take 200 mg by mouth every 6 (six) hours as needed. For pain    . LOSARTAN POTASSIUM 50 MG PO TABS Oral Take 50 mg by mouth at bedtime.    Marland Kitchen  SIMVASTATIN 10 MG PO TABS Oral Take 10 mg by mouth at bedtime.    . ANTIHISTAMINE PO Oral Take 1 tablet by mouth at bedtime as needed. For stuffy nose      BP 119/84  Temp(Src) 98.3 F (36.8 C) (Oral)  Resp 20  SpO2 93%  Physical Exam  Constitutional: He is oriented to person, place, and time. He appears  well-developed and well-nourished. No distress.  HENT:  Head: Normocephalic.  Eyes: Conjunctivae are normal.  Neck: Normal range of motion. Neck supple.  Cardiovascular: Normal rate, regular rhythm, normal heart sounds and intact distal pulses.   No murmur heard. Pulmonary/Chest: Effort normal and breath sounds normal. No respiratory distress.  Abdominal: Soft. Bowel sounds are normal. He exhibits no distension. There is no tenderness.  Musculoskeletal: Normal range of motion. He exhibits no edema and no tenderness.  Neurological: He is alert and oriented to person, place, and time. He has normal strength. No cranial nerve deficit or sensory deficit. Coordination normal. GCS eye subscore is 4. GCS verbal subscore is 5. GCS motor subscore is 6.       Strength at baseline with patients neuromuscular disorder. Unable to perform cerebellar testing given patients NM d/o. No dizziness presently.   Skin: Skin is warm and dry. He is not diaphoretic.  Psychiatric: He has a normal mood and affect.    ED Course  Procedures (including critical care time)  Labs Reviewed  CBC - Abnormal; Notable for the following:    WBC 14.6 (*)    RBC 4.19 (*)    HCT 38.1 (*)    Platelets 415 (*)    All other components within normal limits  DIFFERENTIAL - Abnormal; Notable for the following:    Neutro Abs 10.3 (*)    Monocytes Absolute 1.3 (*)    All other components within normal limits  BASIC METABOLIC PANEL - Abnormal; Notable for the following:    Glucose, Bld 107 (*)    BUN 30 (*)    GFR calc non Af Amer 79 (*)    All other components within normal limits  GLUCOSE, CAPILLARY - Abnormal; Notable for the following:    Glucose-Capillary 104 (*)    All other components within normal limits   Ct Head Wo Contrast  07/26/2011  *RADIOLOGY REPORT*  Clinical Data: transient ischemic attack  CT HEAD WITHOUT CONTRAST  Technique:  Contiguous axial images were obtained from the base of the skull through the  vertex without contrast.  Comparison: 06/11/2011  Findings: The brain has a normal appearance without evidence for hemorrhage, infarction, hydrocephalus, or mass lesion.  There is no extra axial fluid collection.  The skull and paranasal sinuses are normal.  IMPRESSION: Normal exam  Original Report Authenticated By: Rosealee Albee, M.D.     1. TIA (transient ischemic attack)       Date: 07/26/2011  Rate: 87  Rhythm: normal sinus rhythm  QRS Axis: right  Intervals: PR prolonged  ST/T Wave abnormalities: nonspecific T wave changes  Conduction Disutrbances:first-degree A-V block   Narrative Interpretation:   Old EKG Reviewed: changes noted V2 TWI resolved   MDM  Pt is a well appearing 46yo M with no PMH or FH of TIA/stroke who presents after he had a 10 minute episode of speech slurring at work today which is resolved now. Pt also had a very brief episode of dizziness now resolved.  Of note pt has a hx of PPM after 3rd degree AVB but denies and  CP, SOB, palpitations or PPM discharge today. No focal neuro deficits on exam. Labs unremarkable. Head CT pending. Planning for admission to hospitalist service.   Head CT negative. No new deficits. Admitted to hospitalist service in stable condition.         Consuello Masse, MD 07/26/11 1526

## 2011-07-26 NOTE — ED Notes (Signed)
Hospitalist is at the bedside.

## 2011-07-26 NOTE — Progress Notes (Signed)
Utilization Review Completed.William Hartman T4/25/2013   
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
## 2011-07-26 NOTE — ED Notes (Signed)
Pt became weak with slurred speech while at work around 1045. Pt states lasted 10 minutes. Pt had no deficits when EMS arrived on scene. Pt had pacemaker placed 07/02/11 for third degree heart block.

## 2011-07-26 NOTE — ED Notes (Signed)
CBG 104 
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
## 2011-07-26 NOTE — ED Notes (Signed)
Patient transported to CT 

## 2011-07-27 ENCOUNTER — Inpatient Hospital Stay (HOSPITAL_COMMUNITY): Payer: BC Managed Care – PPO

## 2011-07-27 LAB — URINE CULTURE: Culture  Setup Time: 201304251640

## 2011-07-27 LAB — LIPID PANEL
Cholesterol: 134 mg/dL (ref 0–200)
HDL: 32 mg/dL — ABNORMAL LOW (ref >39)
Triglycerides: 121 mg/dL (ref <150)

## 2011-07-27 LAB — VITAMIN B12: Vitamin B-12: 1308 pg/mL — ABNORMAL HIGH (ref 211–911)

## 2011-07-27 LAB — GLUCOSE, CAPILLARY: Glucose-Capillary: 80 mg/dL (ref 70–99)

## 2011-07-27 MED ORDER — ASPIRIN EC 81 MG PO TBEC: 81.0000 mg | DELAYED_RELEASE_TABLET | Freq: Every day | ORAL | Status: AC

## 2011-07-27 NOTE — Progress Notes (Signed)
[facility] PRO[facility]ESS NOTE    SUBJECTIVE   No further issues or events.   OBJECTIVE   Vital signs in last 24 hours: Temp:  [98 F (36.7 C)-98.7 F (37.1 C)] 98.3 F (36.8 C) (04/26 0605) Pulse Rate:  [70-87] 71  (04/26 0929) Resp:  [16-20] 18  (04/26 0929) BP: (115-149)/(80-98) 115/80 mmHg (04/26 0929) SpO2:  [91 %-93 %] 92 % (04/26 0929) Weight:  [84.823 kg (187 lb)] 84.823 kg (187 lb) (04/25 1757)  Intake/Output from previous day: 04/25 0701 - 04/26 0700 In: 120 [P.O.:120] Out: 375 [Urine:375] Intake/Output this shift: Total I/O In: 380 [P.O.:380] Out: -  Nutritional status: Cardiac  Past Medical History  Diagnosis Date  . HYPERLIPIDEMIA   . ARTHROGRYPOSIS MULTIPLEX CONGENITA   . Syncope and collapse 06/2011    PT reported to EMS he has had multiple  episodes of  syncope; " since 2010; increasing in frequency 06/2011"  . HYPERTENSION   . Syncope and collapse     PT reported to EMS he has had multiple  episodes of  syncope.  Marland Kitchen Neuromuscular disorder   . Shortness of breath   . Pacemaker   . DDD (degenerative disc disease), lumbar   . Arthritis     "lower back"  . Third degree heart block 07/02/11    pacemaker placed    Neurologic Exam:   Mental Status:  Alert, oriented, thought content appropriate. Speech fluent without evidence of aphasia. Able to follow 3 step commands without difficulty.  Cranial Nerves:  II: visual fields grossly normal, pupils equal, round, reactive to light and accommodation  III,IV, VI: ptosis not present, extra-ocular motions intact bilaterally  V,VII: smile symmetric, facial light touch sensation normal bilaterally  VIII: hearing normal bilaterally  IX,X: gag reflex present  XI: trapezius strength/neck flexion strength normal bilaterally  XII: tongue strength normal  Motor:  Decreased range of motion in both upper extremities at all joints with abnormal development of all  extremities. Ankles fused.  Sensory: Pinprick and light touch intact throughout, bilaterally  Plantars:  Right: mute Left: mute  Cerebellar:  Not performed  Lab Results: Lab Results  Component Value Date/Time   CHOL 134 07/27/2011  8:20 AM   Lipid Panel  Basename 07/27/11 0820  CHOL 134  TRIG 121  HDL 32*  CHOLHDL 4.2  VLDL 24  LDLCALC 78    Studies/Results: Dg Chest 2 View  07/26/2011  *RADIOLOGY REPORT*  Clinical Data: Chest pain  CHEST - 2 VIEW  Comparison: July 03, 2011  Findings: Heart is normal in size.  Dual lead right subclavian pacemaker device is stable.  Clear lungs.  No pneumothorax and no pleural effusion.  IMPRESSION: No active cardiopulmonary disease.  Original Report Authenticated By: Donavan Burnet, M.D.   Ct Head Wo Contrast  07/26/2011  *RADIOLOGY REPORT*  Clinical Data: transient ischemic attack  CT HEAD WITHOUT CONTRAST  Technique:  Contiguous axial images were obtained from the base of the skull through the vertex without contrast.  Comparison: 06/11/2011  Findings: The brain has a normal appearance without evidence for hemorrhage, infarction, hydrocephalus, or mass lesion.  There is no extra axial fluid collection.  The skull and paranasal sinuses are normal.  IMPRESSION: Normal exam  Original Report Authenticated By: Simonne Martinet.  Bradly Chris, M.D.    Medications:     Scheduled:   . sodium chloride   Intravenous STAT  . aspirin  325 mg Oral Daily  . simvastatin  10 mg Oral QHS  . DISCONTD: vitamin B-12  2,500 mcg Oral QHS  . DISCONTD: Vitamin B-12  1 tablet Sublingual QHS    Assessment/Plan:   TIA (transient ischemic attack) (07/26/2011) Assessment: Patient with transient difficulty with speech and gait. Etiology unclear. Will attempt to rule out TIA. Has had a recent echo. Plan:   1. Carotid dopplers ---pending 2. Repeat head CT ---pending 3. Would have cardiology investigate pacer ---pending 4. Agree with discontinuation of pseudoephrine  5. Agree  with ASA  6. Tele monitoring  7. Hemoglobin A1c--5.8 and lipid panel--78    Felicie Morn PA-C [facility] [phone]  07/27/2011, 12:29 PM

## 2011-07-27 NOTE — Progress Notes (Signed)
VASCULAR LAB PRELIMINARY  PRELIMINARY  PRELIMINARY  PRELIMINARY  Carotid duplex  completed.    Preliminary report:  Bilateral:  No evidence of hemodynamically significant internal carotid artery stenosis.   Vertebral artery flow is antegrade.     Terance Hart, RVT 07/27/2011, 2:13 PM

## 2011-07-27 NOTE — Discharge Instructions (Signed)
STROKE/TIA DISCHARGE INSTRUCTIONS SMOKING Cigarette smoking nearly doubles your risk of having a stroke & is the single most alterable risk factor  If you smoke or have smoked in the last 12 months, you are advised to quit smoking for your health.  Most of the excess cardiovascular risk related to smoking disappears within a year of stopping.  Ask you doctor about anti-smoking medications  [place] Quit Line: 1-800-QUIT NOW  Free Smoking Cessation Classes [phone]  CHOLESTEROL Know your levels; limit fat & cholesterol in your diet  Lipid Panel     Component Value Date/Time   CHOL 162 12/28/2010 1026   TRIG 144.0 12/28/2010 1026   HDL 46.10 12/28/2010 1026   CHOLHDL 4 12/28/2010 1026   VLDL 28.8 12/28/2010 1026   LDLCALC 87 12/28/2010 1026      Many patients benefit from treatment even if their cholesterol is at goal.  Goal: Total Cholesterol (CHOL) less than 160  Goal:  Triglycerides (TRIG) less than 150  Goal:  HDL greater than 40  Goal:  LDL (LDLCALC) less than 100   BLOOD PRESSURE American Stroke Association blood pressure target is less that 120/80 mm/Hg  Your discharge blood pressure is:  BP: 133/94 mmHg  Monitor your blood pressure  Limit your salt and alcohol intake  Many individuals will require more than one medication for high blood pressure  DIABETES (A1c is a blood sugar average for last 3 months) Goal HGBA1c is under 7% (HBGA1c is blood sugar average for last 3 months)  Diabetes: {STROKE DC DIABETES:22357}    Lab Results  Component Value Date   HGBA1C 5.8* 07/26/2011     Your HGBA1c can be lowered with medications, healthy diet, and exercise.  Check your blood sugar as directed by your physician  Call your physician if you experience unexplained or low blood sugars.  PHYSICAL ACTIVITY/REHABILITATION Goal is 30 minutes at least 4 days per week    {STROKE DC ACTIVITY/REHAB:22359}  Activity decreases your risk of heart attack and stroke and makes your heart  stronger.  It helps control your weight and blood pressure; helps you relax and can improve your mood.  Participate in a regular exercise program.  Talk with your doctor about the best form of exercise for you (dancing, walking, swimming, cycling).  DIET/WEIGHT Goal is to maintain a healthy weight  Your discharge diet is: Cardiac *** liquids Your height is:  Height: 5\' 6"  (167.6 cm) Your current weight is: Weight: 84.823 kg (187 lb) Your Body Mass Index (BMI) is:  BMI (Calculated): 30.2   Following the type of diet specifically designed for you will help prevent another stroke.  Your goal weight range is:  ***  Your goal Body Mass Index (BMI) is 19-24.  Healthy food habits can help reduce 3 risk factors for stroke:  High cholesterol, hypertension, and excess weight.  RESOURCES Stroke/Support Group:  Call [phone]  they meet the 3rd Sunday of the month on the Rehab Unit at [facility], [place] ( no meetings June, July & Aug).  STROKE EDUCATION PROVIDED/REVIEWED AND GIVEN TO PATIENT Stroke warning signs and symptoms How to activate emergency medical system (call 911). Medications prescribed at discharge. Need for follow-up after discharge. Personal risk factors for stroke. Pneumonia vaccine given:   {STROKE DC YES/NO/DATE:22363} Flu vaccine given:   {STROKE DC YES/NO/DATE:22363} My questions have been answered, the writing is legible, and I understand these instructions.  I will adhere to these goals & educational materials that have been provided  to me after my discharge from the hospital.

## 2011-07-27 NOTE — Progress Notes (Signed)
Clinical Social Work Department BRIEF PSYCHOSOCIAL ASSESSMENT 07/27/2011  Patient:  William Hartman, William Hartman     Account Number:  [id number]     Admit date:  07/26/2011  Clinical Social Worker:  Dennison Bulla  Date/Time:  07/27/2011 03:15 PM  Referred by:  Physician  Date Referred:  07/27/2011 Referred for  Advanced Directives   Other Referral:   Interview type:  Patient Other interview type:    PSYCHOSOCIAL DATA Living Status:  FAMILY Admitted from facility:   Level of care:   Primary support name:  Chase Primary support relationship to patient:  SIBLING Degree of support available:   Adequate    CURRENT CONCERNS Current Concerns  Other - See comment   Other Concerns:   Advanced directive    SOCIAL WORK ASSESSMENT / PLAN CSW received referral for Advanced directives for patient. CSW met with patient at bedside. Patient reported he was interested in AD but reported that he just wanted the information and would fill them out at a later time. CSW explained AD and left packet with patient. Patient reported no further concerns. Patient reported he is being discharged today and reports he will call a family member to pick him up. CSW is signing off but available if needed.   Assessment/plan status:  No Further Intervention Required Other assessment/ plan:   Information/referral to community resources:   Advanced directives packet    PATIENT'S/FAMILY'S RESPONSE TO PLAN OF CARE: Patient was alert and oriented. Patient appreciative of CSW consult.
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
## 2011-08-01 LAB — MISCELLANEOUS TEST

## 2011-08-09 ENCOUNTER — Encounter: Payer: Self-pay | Admitting: Neurology

## 2011-08-16 ENCOUNTER — Ambulatory Visit (INDEPENDENT_AMBULATORY_CARE_PROVIDER_SITE_OTHER): Payer: BC Managed Care – PPO | Admitting: Cardiology

## 2011-08-16 ENCOUNTER — Encounter: Payer: Self-pay | Admitting: Cardiology

## 2011-08-16 VITALS — BP 160/80 | HR 90 | Wt 179.0 lb

## 2011-08-16 DIAGNOSIS — R55 Syncope and collapse: Secondary | ICD-10-CM

## 2011-08-16 DIAGNOSIS — G459 Transient cerebral ischemic attack, unspecified: Secondary | ICD-10-CM

## 2011-08-16 DIAGNOSIS — I10 Essential (primary) hypertension: Secondary | ICD-10-CM

## 2011-08-16 DIAGNOSIS — Z950 Presence of cardiac pacemaker: Secondary | ICD-10-CM

## 2011-08-16 NOTE — Assessment & Plan Note (Signed)
followup neurology.
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
## 2011-08-16 NOTE — Patient Instructions (Signed)
Your physician recommends that you schedule a follow-up appointment in: 6 WEEKS WITH DRCRENSHAW  

## 2011-08-16 NOTE — Assessment & Plan Note (Signed)
Management per electrophysiology. 

## 2011-08-16 NOTE — Assessment & Plan Note (Signed)
Patient's blood pressure is elevated today. He was not orthostatic on examination. He would like to see if his symptoms improved with adjusting his pacemaker prior to reinitiating his antihypertensive. He will follow his blood pressure at home and we will resume if needed.

## 2011-08-16 NOTE — Assessment & Plan Note (Signed)
Patient continues to have near syncope. His spells are less severe. His pacemaker was reprogrammed today for rate drop algorithm. Hopefully this will improve his symptoms. He was discussed with Dr. Graciela Husbands this morning.

## 2011-08-23 ENCOUNTER — Other Ambulatory Visit: Payer: Self-pay | Admitting: *Deleted

## 2011-08-23 MED ORDER — SIMVASTATIN 10 MG PO TABS: 10.0000 mg | ORAL_TABLET | Freq: Every day | ORAL | Status: DC

## 2011-09-01 DIAGNOSIS — I6529 Occlusion and stenosis of unspecified carotid artery: Secondary | ICD-10-CM

## 2011-09-01 HISTORY — DX: Occlusion and stenosis of unspecified carotid artery: I65.29

## 2011-09-19 ENCOUNTER — Ambulatory Visit (INDEPENDENT_AMBULATORY_CARE_PROVIDER_SITE_OTHER): Payer: BC Managed Care – PPO | Admitting: Neurology

## 2011-09-19 ENCOUNTER — Encounter: Payer: Self-pay | Admitting: Neurology

## 2011-09-19 VITALS — BP 132/86 | HR 84 | Wt 183.0 lb

## 2011-09-19 DIAGNOSIS — G459 Transient cerebral ischemic attack, unspecified: Secondary | ICD-10-CM
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
## 2011-09-19 NOTE — Patient Instructions (Addendum)
Your CT's are scheduled for Wednesayday, June 26 at 10:00am.   Please arrive to [facility], first floor admitting by 9:45am.  [phone].

## 2011-09-24 ENCOUNTER — Other Ambulatory Visit (HOSPITAL_COMMUNITY): Payer: BC Managed Care – PPO

## 2011-09-26 ENCOUNTER — Ambulatory Visit (HOSPITAL_COMMUNITY)
Admission: RE | Admit: 2011-09-26 | Discharge: 2011-09-26 | Disposition: A | Discharge status: Home / Self Care | Payer: BC Managed Care – PPO | Source: Ambulatory Visit | Attending: Neurology | Admitting: Neurology

## 2011-09-26 ENCOUNTER — Ambulatory Visit (HOSPITAL_COMMUNITY): Payer: BC Managed Care – PPO

## 2011-09-26 DIAGNOSIS — G459 Transient cerebral ischemic attack, unspecified: Secondary | ICD-10-CM

## 2011-09-26 DIAGNOSIS — R471 Dysarthria and anarthria: Secondary | ICD-10-CM | POA: Insufficient documentation

## 2011-09-26 DIAGNOSIS — R269 Unspecified abnormalities of gait and mobility: Secondary | ICD-10-CM | POA: Insufficient documentation

## 2011-09-26 DIAGNOSIS — I6529 Occlusion and stenosis of unspecified carotid artery: Secondary | ICD-10-CM | POA: Insufficient documentation

## 2011-09-26 MED ORDER — IOHEXOL 350 MG/ML SOLN
100.0000 mL | Freq: Once | INTRAVENOUS | Status: AC | PRN
  Administered 2011-09-26: 100 mL via INTRAVENOUS

## 2011-09-27 ENCOUNTER — Encounter: Payer: Self-pay | Admitting: Cardiology

## 2011-09-27 ENCOUNTER — Ambulatory Visit (INDEPENDENT_AMBULATORY_CARE_PROVIDER_SITE_OTHER): Payer: BC Managed Care – PPO | Admitting: Cardiology

## 2011-09-27 VITALS — BP 152/107 | HR 100 | Wt 183.0 lb

## 2011-09-27 DIAGNOSIS — I10 Essential (primary) hypertension: Secondary | ICD-10-CM

## 2011-09-27 DIAGNOSIS — Z950 Presence of cardiac pacemaker: Secondary | ICD-10-CM

## 2011-09-27 DIAGNOSIS — E785 Hyperlipidemia, unspecified: Secondary | ICD-10-CM

## 2011-09-27 MED ORDER — LOSARTAN POTASSIUM 50 MG PO TABS: 50.0000 mg | ORAL_TABLET | Freq: Every day | ORAL | Status: DC

## 2011-09-27 NOTE — Assessment & Plan Note (Signed)
Patient has had much improved symptoms since his pacemaker was reprogrammed. Followup electrophysiology.

## 2011-09-27 NOTE — Assessment & Plan Note (Signed)
Management per electrophysiology. 

## 2011-09-27 NOTE — Assessment & Plan Note (Signed)
Blood pressure is elevated. Resume Cozaar 50 mg by mouth daily and increase as needed.

## 2011-09-27 NOTE — Patient Instructions (Addendum)
Your physician wants you to follow-up in: 6 MONTHS WITH DR Jens Som You will receive a reminder letter in the mail two months in advance. If you don't receive a letter, please call our office to schedule the follow-up appointment.   RESTART LOSARTAN 50 MG ONCE DAILY

## 2011-09-27 NOTE — Assessment & Plan Note (Signed)
Continue statin. Management per primary care. 
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
## 2011-10-01 ENCOUNTER — Encounter: Payer: Self-pay | Admitting: Neurology

## 2011-10-02 ENCOUNTER — Telehealth: Payer: Self-pay | Admitting: Neurology

## 2011-10-02 NOTE — Telephone Encounter (Signed)
Message copied by Benay Spice on Tue Oct 02, 2011  9:50 AM ------      Message from: Milas Gain      Created: Mon Oct 01, 2011  2:47 PM       Let Mr. Pina know there is some atherosclerosis in his carotid artery on the left.  It is mild in stenosis but because of his young age will need to be followed over time.  Unsure whether this caused his spell of difficulty speaking though.

## 2011-10-02 NOTE — Telephone Encounter (Signed)
Left a message for the patient to return my call.  

## 2011-10-03 ENCOUNTER — Encounter: Payer: Self-pay | Admitting: *Deleted

## 2011-10-08 NOTE — Telephone Encounter (Signed)
I left a message for the patient to return my call.

## 2011-10-09 NOTE — Telephone Encounter (Signed)
Spoke with the patient. Information given as directed by Dr. Modesto Charon below. No other issues or concerns voiced at this time.

## 2011-10-10 ENCOUNTER — Encounter: Payer: Self-pay | Admitting: Internal Medicine

## 2011-10-10 ENCOUNTER — Ambulatory Visit (INDEPENDENT_AMBULATORY_CARE_PROVIDER_SITE_OTHER): Payer: BC Managed Care – PPO | Admitting: Internal Medicine

## 2011-10-10 VITALS — BP 122/87 | HR 75 | Ht 66.0 in | Wt 182.0 lb

## 2011-10-10 DIAGNOSIS — R42 Dizziness and giddiness: Secondary | ICD-10-CM

## 2011-10-10 DIAGNOSIS — I442 Atrioventricular block, complete: Secondary | ICD-10-CM

## 2011-10-10 DIAGNOSIS — Z950 Presence of cardiac pacemaker: Secondary | ICD-10-CM

## 2011-10-10 LAB — PACEMAKER DEVICE OBSERVATION
AL IMPEDENCE PM: 478 Ohm
ATRIAL PACING PM: 31
BATTERY VOLTAGE: 2.79 V
RV LEAD THRESHOLD: 0.625 V

## 2011-10-10 NOTE — Patient Instructions (Signed)
Your physician wants you to follow-up in: April 2014 with Dr. Klein. You will receive a reminder letter in the mail two months in advance. If you don't receive a letter, please call our office to schedule the follow-up appointment.  Your physician recommends that you continue on your current medications as directed. Please refer to the Current Medication list given to you today.  

## 2011-10-11 DIAGNOSIS — R42 Dizziness and giddiness: Secondary | ICD-10-CM | POA: Insufficient documentation

## 2011-10-11 NOTE — Assessment & Plan Note (Signed)
Device dependent. 
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
## 2011-10-11 NOTE — Assessment & Plan Note (Signed)
This apparently was remediated in large part by the use of Rate drop response.  So a brilliant solution.  Will not reprogram this

## 2011-10-11 NOTE — Assessment & Plan Note (Signed)
The patient's device was interrogated and the information was fully reviewed.  The device was reprogrammed to maximize longevity  

## 2011-11-09 ENCOUNTER — Telehealth: Payer: Self-pay | Admitting: Cardiology

## 2011-11-09 NOTE — Telephone Encounter (Signed)
New msg Pt said one of his meds is making him dizzy. Please call

## 2011-11-09 NOTE — Telephone Encounter (Signed)
Spoke with pt, Aware of dr crenshaw's recommendations.  °

## 2011-11-09 NOTE — Telephone Encounter (Signed)
Follow BP off med. William Hartman

## 2011-11-09 NOTE — Telephone Encounter (Signed)
Spoke with pt, he reports having dizziness and being off balance. He feels it is related to the losartan. He did not take the losartan last night and has no dizziness or issues today. He has not checked his bp. Will forward for dr Jens Som review

## 2011-11-17 ENCOUNTER — Encounter (HOSPITAL_COMMUNITY): Payer: Self-pay | Admitting: *Deleted

## 2011-11-17 ENCOUNTER — Emergency Department (HOSPITAL_COMMUNITY): Payer: BC Managed Care – PPO

## 2011-11-17 ENCOUNTER — Emergency Department (HOSPITAL_COMMUNITY)
Admission: EM | Admit: 2011-11-17 | Discharge: 2011-11-17 | Disposition: A | Discharge status: Home / Self Care | Payer: BC Managed Care – PPO | Attending: Emergency Medicine | Admitting: Emergency Medicine

## 2011-11-17 DIAGNOSIS — Z79899 Other long term (current) drug therapy: Secondary | ICD-10-CM | POA: Insufficient documentation

## 2011-11-17 DIAGNOSIS — Z950 Presence of cardiac pacemaker: Secondary | ICD-10-CM | POA: Insufficient documentation

## 2011-11-17 DIAGNOSIS — R0602 Shortness of breath: Secondary | ICD-10-CM | POA: Insufficient documentation

## 2011-11-17 DIAGNOSIS — I10 Essential (primary) hypertension: Secondary | ICD-10-CM | POA: Insufficient documentation

## 2011-11-17 DIAGNOSIS — E785 Hyperlipidemia, unspecified: Secondary | ICD-10-CM | POA: Insufficient documentation

## 2011-11-17 DIAGNOSIS — I442 Atrioventricular block, complete: Secondary | ICD-10-CM

## 2011-11-17 DIAGNOSIS — R42 Dizziness and giddiness: Secondary | ICD-10-CM | POA: Insufficient documentation

## 2011-11-17 DIAGNOSIS — R5381 Other malaise: Secondary | ICD-10-CM | POA: Insufficient documentation

## 2011-11-17 LAB — BASIC METABOLIC PANEL
BUN: 18 mg/dL (ref 6–23)
CO2: 20 mEq/L (ref 19–32)
GFR calc non Af Amer: >90 mL/min (ref >90)
Glucose, Bld: 113 mg/dL — ABNORMAL HIGH (ref 70–99)
Potassium: 4 mEq/L (ref 3.5–5.1)

## 2011-11-17 LAB — CBC
HCT: 42.6 % (ref 39.0–52.0)
Hemoglobin: 14.8 g/dL (ref 13.0–17.0)
MCHC: 34.7 g/dL (ref 30.0–36.0)

## 2011-11-17 LAB — URINALYSIS, ROUTINE W REFLEX MICROSCOPIC
Bilirubin Urine: NEGATIVE
Glucose, UA: NEGATIVE mg/dL
Hgb urine dipstick: NEGATIVE
Specific Gravity, Urine: 1.014 (ref 1.005–1.030)
Urobilinogen, UA: 0.2 mg/dL (ref 0.0–1.0)
pH: 5.5 (ref 5.0–8.0)

## 2011-11-17 LAB — POCT I-STAT TROPONIN I

## 2011-11-17 NOTE — ED Notes (Signed)
Cardiology MD at bedside assessing patient prior to discharge.

## 2011-11-17 NOTE — ED Provider Notes (Signed)
Patient was seen by cardiology(Dr. Dietrich Pates),  Will follow up as outpatient  Nelia Shi, MD 11/17/11 [phone]

## 2011-11-17 NOTE — ED Notes (Signed)
Pt reports having a feeling of "heaviness" to his entire body that started yesterday, today is feeling weak and unsteady gait. Reports mild sob. ekg done at triage. No distress noted.
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
## 2011-11-17 NOTE — ED Notes (Signed)
Pt reports all over body "heaviness," SOB, and dizziness w/walking x2 1/2 weeks. Pt denies chest/back/abd pain, or N/V/D. Pt reports getting a "tickle" in his throat yesterday casing him to cough and vomited a small amount of phlegm. Pt reports having a pacemaker inserted April 1 st and cardiologist dc'd pt's Losartan August 1

## 2011-11-19 ENCOUNTER — Telehealth: Payer: Self-pay | Admitting: Cardiology

## 2011-11-19 NOTE — Telephone Encounter (Signed)
Patient called today to make an appointment with Dr. Jens Som. Patient was in the ED this past Saturday. An appointment was made with Jacolyn Reedy PA, for Tuesday 11/20/11 at 0830 AM, because Dr. Ludwig Clarks scheduled is full, pt is aware.

## 2011-11-19 NOTE — ED Provider Notes (Signed)
Medical screening examination/treatment/procedure(s) were performed by non-physician practitioner and as supervising physician I was immediately available for consultation/collaboration.   Darnesha Diloreto, MD 11/19/11 0659 

## 2011-11-19 NOTE — Telephone Encounter (Signed)
New msg Pt went to ED Saturday and was told to call today. Please call

## 2011-11-20 ENCOUNTER — Ambulatory Visit (INDEPENDENT_AMBULATORY_CARE_PROVIDER_SITE_OTHER): Payer: BC Managed Care – PPO | Admitting: Physician Assistant

## 2011-11-20 ENCOUNTER — Ambulatory Visit (INDEPENDENT_AMBULATORY_CARE_PROVIDER_SITE_OTHER): Payer: BC Managed Care – PPO | Admitting: *Deleted

## 2011-11-20 ENCOUNTER — Other Ambulatory Visit: Payer: Self-pay | Admitting: *Deleted

## 2011-11-20 ENCOUNTER — Encounter: Payer: Self-pay | Admitting: Physician Assistant

## 2011-11-20 ENCOUNTER — Encounter: Payer: Self-pay | Admitting: Internal Medicine

## 2011-11-20 VITALS — BP 146/96 | HR 60 | Ht 67.0 in | Wt 180.0 lb

## 2011-11-20 DIAGNOSIS — R55 Syncope and collapse: Secondary | ICD-10-CM

## 2011-11-20 DIAGNOSIS — I442 Atrioventricular block, complete: Secondary | ICD-10-CM

## 2011-11-20 DIAGNOSIS — I10 Essential (primary) hypertension: Secondary | ICD-10-CM

## 2011-11-20 DIAGNOSIS — R42 Dizziness and giddiness: Secondary | ICD-10-CM

## 2011-11-20 DIAGNOSIS — Z950 Presence of cardiac pacemaker: Secondary | ICD-10-CM

## 2011-11-20 LAB — PACEMAKER DEVICE OBSERVATION

## 2011-11-20 MED ORDER — PYRIDOSTIGMINE BROMIDE 60 MG PO TABS: ORAL_TABLET | ORAL | Status: DC

## 2011-11-20 NOTE — Patient Instructions (Addendum)
Your physician has recommended you make the following change in your medication: START MESTINON take one half tablet (30 MG) twice a day  Your physician recommends that you schedule a follow-up appointment in: 4 weeks with Dr. Graciela Husbands

## 2011-11-20 NOTE — Assessment & Plan Note (Signed)
William Hartman Patient's blood pressure did rise with standing. We'll see how he does on the Mestinon.
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
## 2011-11-20 NOTE — Assessment & Plan Note (Signed)
Patient was checked today and a rate drop turned off.

## 2011-11-20 NOTE — Progress Notes (Signed)
Pacer check in clinic. Seeing William L.

## 2011-11-20 NOTE — Assessment & Plan Note (Signed)
After reviewing the patient's orthostatics, pacer check, Dr. Graciela Husbands recommending turning his rate dropped completely off and starting Mestinon 30 mg b.i.d. He will follow-up with him in 4 weeks.

## 2011-12-18 ENCOUNTER — Ambulatory Visit: Payer: BC Managed Care – PPO | Admitting: Physician Assistant

## 2012-01-23 ENCOUNTER — Ambulatory Visit: Payer: BC Managed Care – PPO | Admitting: Internal Medicine

## 2012-03-21 ENCOUNTER — Ambulatory Visit (INDEPENDENT_AMBULATORY_CARE_PROVIDER_SITE_OTHER): Payer: BC Managed Care – PPO | Admitting: Cardiology

## 2012-03-21 ENCOUNTER — Encounter: Payer: Self-pay | Admitting: Cardiology

## 2012-03-21 VITALS — BP 139/106 | HR 83 | Wt 186.0 lb

## 2012-03-21 DIAGNOSIS — I10 Essential (primary) hypertension: Secondary | ICD-10-CM

## 2012-03-21 DIAGNOSIS — Z950 Presence of cardiac pacemaker: Secondary | ICD-10-CM

## 2012-03-21 DIAGNOSIS — E785 Hyperlipidemia, unspecified: Secondary | ICD-10-CM

## 2012-03-21 DIAGNOSIS — R42 Dizziness and giddiness: Secondary | ICD-10-CM

## 2012-03-21 NOTE — Assessment & Plan Note (Signed)
Management per electrophysiology. 

## 2012-03-21 NOTE — Assessment & Plan Note (Signed)
Blood pressure is elevated. I have asked him to follow this at home. We will add medications as needed. Our goal will be systolic of 140 or less and diastolic of 90 or less. We will allow his pressure to run mildly higher given history of orthostasis.

## 2012-03-21 NOTE — Patient Instructions (Addendum)
Your physician wants you to follow-up in: 6 MONTHS WITH DR Jens Som You will receive a reminder letter in the mail two months in advance. If you don't receive a letter, please call our office to schedule the follow-up appointment.   TRACK BLOOD PRESSURE

## 2012-03-21 NOTE — Assessment & Plan Note (Signed)
Management per primary care. 
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
## 2012-03-21 NOTE — Assessment & Plan Note (Signed)
No further episodes after insertion of pacemaker in addition of Mestinon.

## 2012-04-04 ENCOUNTER — Other Ambulatory Visit: Payer: Self-pay | Admitting: *Deleted

## 2012-04-04 MED ORDER — SIMVASTATIN 10 MG PO TABS: 10.0000 mg | ORAL_TABLET | Freq: Every day | ORAL | Status: DC

## 2012-04-09 ENCOUNTER — Ambulatory Visit (INDEPENDENT_AMBULATORY_CARE_PROVIDER_SITE_OTHER): Payer: BC Managed Care – PPO | Admitting: Internal Medicine

## 2012-04-09 ENCOUNTER — Encounter: Payer: Self-pay | Admitting: Internal Medicine

## 2012-04-09 VITALS — BP 142/90 | HR 98 | Temp 99.1°F | Wt 187.4 lb

## 2012-04-09 DIAGNOSIS — R42 Dizziness and giddiness: Secondary | ICD-10-CM

## 2012-04-09 DIAGNOSIS — G4700 Insomnia, unspecified: Secondary | ICD-10-CM

## 2012-04-09 DIAGNOSIS — I10 Essential (primary) hypertension: Secondary | ICD-10-CM

## 2012-04-09 DIAGNOSIS — E785 Hyperlipidemia, unspecified: Secondary | ICD-10-CM

## 2012-04-09 DIAGNOSIS — Z23 Encounter for immunization: Secondary | ICD-10-CM

## 2012-04-09 DIAGNOSIS — R55 Syncope and collapse: Secondary | ICD-10-CM

## 2012-04-09 MED ORDER — MELATONIN 3 MG PO TABS: 3.0000 mg | ORAL_TABLET | Freq: Every evening | ORAL | Status: DC | PRN

## 2012-04-09 NOTE — Assessment & Plan Note (Signed)
Prev on ARB - none since [facility] spring 2013 Running high, but atypical squamous cells of undetermined significance No tx recommended given hx ortho stasis per cards eval/OV 03/2012 but will monitor same  BP Readings from Last 3 Encounters:  04/09/12 142/90  03/21/12 139/106  11/20/11 146/96

## 2012-04-09 NOTE — Assessment & Plan Note (Signed)
All symptoms have resolved following PPM 06/2011 for complete AVB and addition of mestinon 11/2011 Interval hx reviewed in depth with pt today The current medical regimen is effective;  continue present plan and medications.

## 2012-04-09 NOTE — Patient Instructions (Signed)
It was good to see you today. We have reviewed your prior records including labs and tests today Medications reviewed, no changes at this time. Ok to try melatonin Please schedule followup in 6 months for blood pressure and cholesterol check, call sooner if problems. Flu shot today

## 2012-04-09 NOTE — Progress Notes (Signed)
Subjective:    Patient ID: William Hartman, male    DOB: [birth year], 47 y.o.   MRN: [id number]  HPI here for followup - reviewed chronic medical issues today:  3rd degree AVB - dx 06/2011 after recurrent syncope and dizzy events - ER eval and PPM placed 4/1/3 - follows with cards for same - on mestinon for residual dizzy symptoms since 11/2011 and feels well  hypertension - reports compliance with ongoing medical treatment and no changes in medication dose or frequency. denies adverse side effects related to current therapy. No chest pain or edema, no headache   dyslipidemia - resumed statin 03/2010 - the patient reports compliance with medication(s) as prescribed. Denies adverse side effects.  AMC (arthrogryposis multiplex congenita) - mulitple surg for same as child - stable symptoms - able to walk indep since back injections for DDD in 2010 - follows regularly with ortho for same (voytek) - recent flare of right leg/hip/back pain but reluctant to repeat shot series  Past Medical History  Diagnosis Date  . HYPERLIPIDEMIA   . ARTHROGRYPOSIS MULTIPLEX CONGENITA   . Syncope and collapse 06/2011    PT reported to EMS he has had multiple  episodes of  syncope; " since 2010; increasing in frequency 06/2011"  . HYPERTENSION   . Syncope and collapse     PT reported to EMS he has had multiple  episodes of  syncope.  Marland Kitchen Neuromuscular disorder   . Pacemaker   . DDD (degenerative disc disease), lumbar   . Arthritis     "lower back"  . Third degree heart block 07/02/11    pacemaker placed  . Internal carotid artery stenosis 09/2011    Left proximal ICA stenosis of 20% on CTA  - ordered because of a spell of dysarthria   Review of Systems  Constitutional: Negative for fever and fatigue.  Respiratory: Negative for cough and shortness of breath.   Cardiovascular: Negative for chest pain and palpitations.  Genitourinary: Negative for dysuria.  Neurological: Negative for dizziness and syncope.    Psychiatric/Behavioral: Positive for sleep disturbance. Negative for dysphoric mood and decreased concentration. The patient is not nervous/anxious.       Objective:   Physical Exam  BP 142/90  Pulse 98  Temp 99.1 F (37.3 C) (Oral)  Wt 187 lb 6.4 oz (85.004 kg)  SpO2 96% Wt Readings from Last 3 Encounters:  04/09/12 187 lb 6.4 oz (85.004 kg)  03/21/12 186 lb (84.369 kg)  11/20/11 180 lb (81.647 kg)   Constitutional: He appears well-nourished. No distress. 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 No murmur or rub heard. No BLE edema Pulmonary/Chest: Effort normal and breath sounds normal. No respiratory distress. He has no wheezes.  Skin: well healed pacer pocket on R anterior chest Neurological: he is alert and oriented to person, place, and time. No cranial nerve deficit. Balance and coordination normal/baseline - Gait affected by MSKel shortening (chronic). strength 5/5 Psychiatric: he has a normal mood and affect. behavior is normal. Judgment and thought content normal.       Lab Results  Component Value Date   WBC 12.2* 11/17/2011   HGB 14.8 11/17/2011   HCT 42.6 11/17/2011   PLT 367 11/17/2011   CHOL 134 07/27/2011   TRIG 121 07/27/2011   HDL 32* 07/27/2011   LDLDIRECT 153.0 03/28/2010   ALT 43 12/28/2010   AST 35 12/28/2010   NA 138 11/17/2011  K 4.0 11/17/2011   CL 104 11/17/2011   CREATININE 0.61 11/17/2011   BUN 18 11/17/2011   CO2 20 11/17/2011   TSH 0.875 07/26/2011   PSA 2.14 07/24/2011   INR 1.13 07/01/2011   HGBA1C 5.8* 07/26/2011   Assessment & Plan:  See problem list. Medications and labs reviewed today.  Time spent with pt today 25 minutes, greater than 50% time spent counseling patient on cardiac issues, insomnia and medication review. Also review of prior records

## 2012-04-09 NOTE — Assessment & Plan Note (Signed)
On statin since 03/2010 - tolerating well Last lipids reviewed - at goal Recheck annually and as needed

## 2012-04-09 NOTE — Assessment & Plan Note (Signed)
Recurrent dizzy events and syncope - ER eval 06/2011 identified problem S/p PPM 07/02/11 - Reviewed same - to follow with cards -

## 2012-06-07 IMAGING — CT CT HEAD W/O CM
1 of 2 series · 16 of 30 positions shown, 20 images · non-contrast
Comparison: 06/11/2011

CLINICAL DATA: transient ischemic attack

CT HEAD WITHOUT CONTRAST
TECHNIQUE: Contiguous axial images were obtained from the base of
the skull through the vertex without contrast.

[Series 2: head routine 4.8 h37s · axial · 0.46mm/px · z∈[-164,-6]mm · 16 of 36 slices shown, 20 images]
[im 2/36  brain]
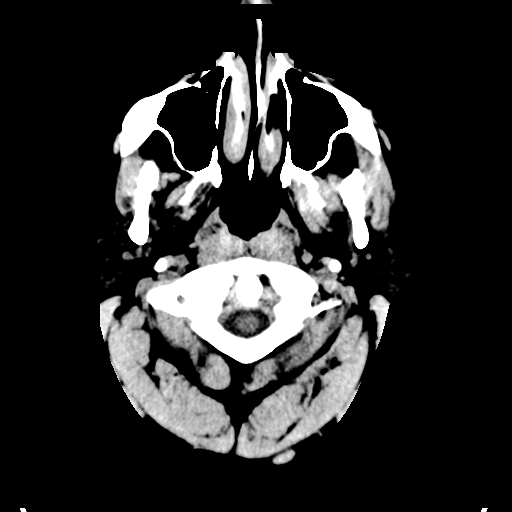
[im 2/36  bone]
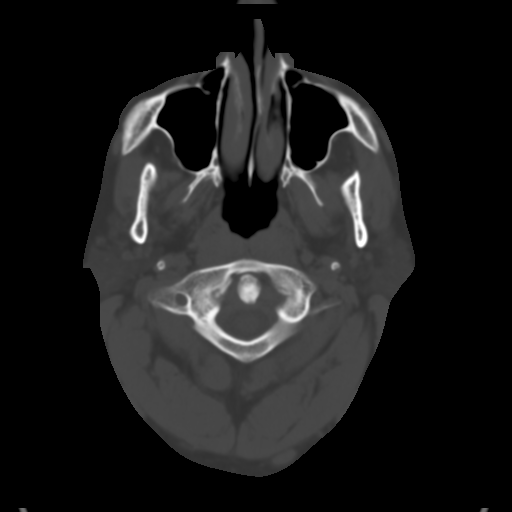
[im 4/36  brain]
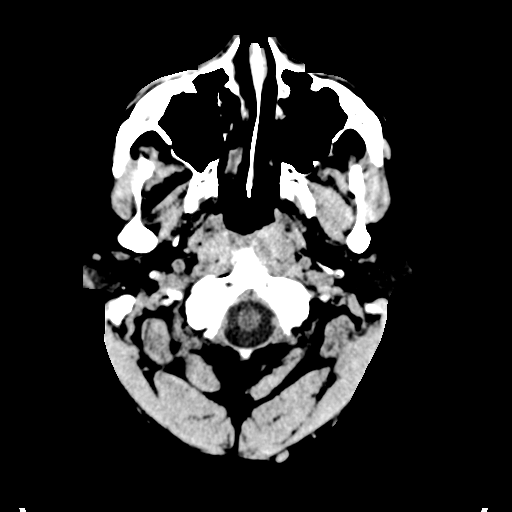
[im 7/36  brain]
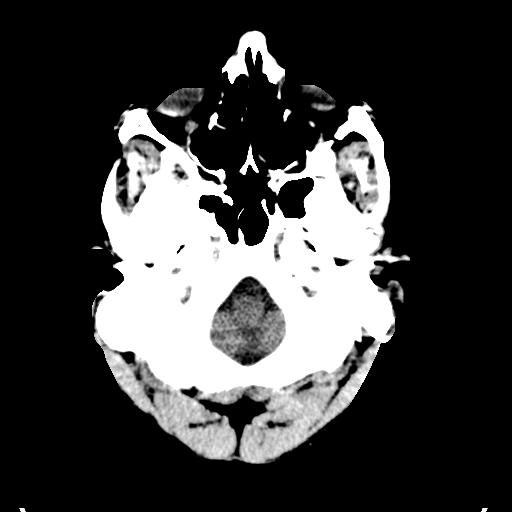
[im 9/36  brain]
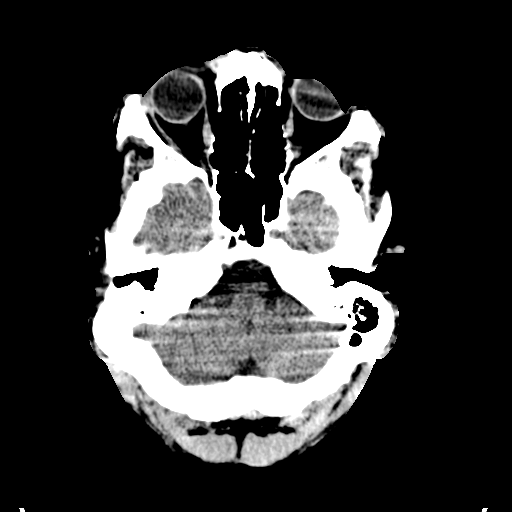
[im 11/36  brain]
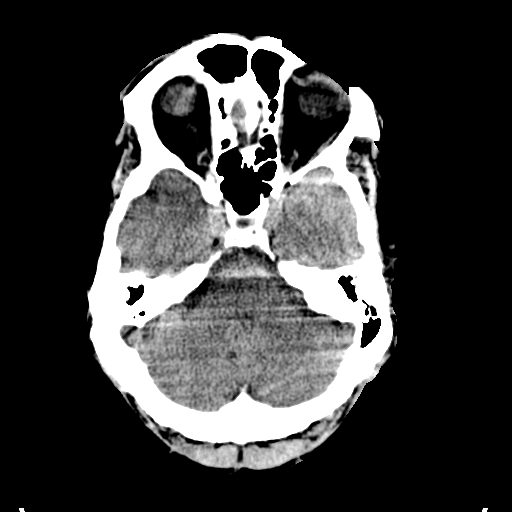
[im 11/36  bone]
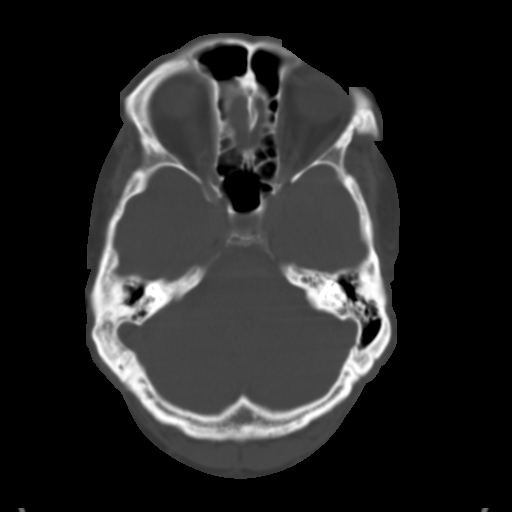
[im 12/36  brain]
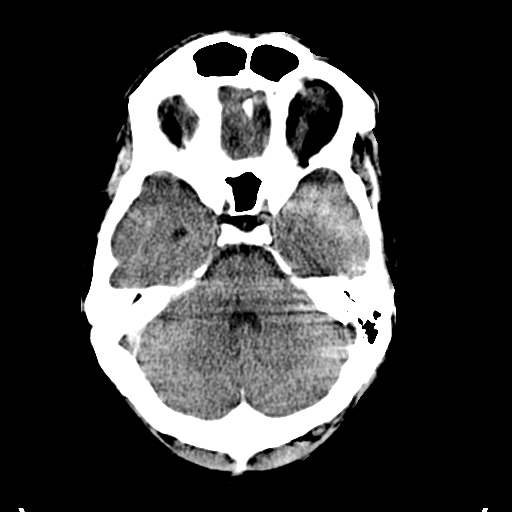
[im 16/36  brain]
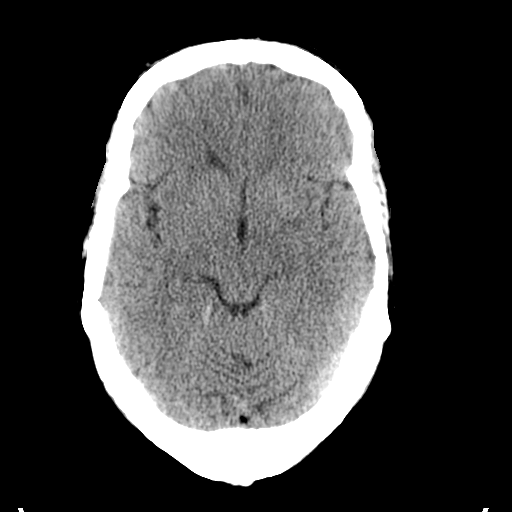
[im 17/36  brain]
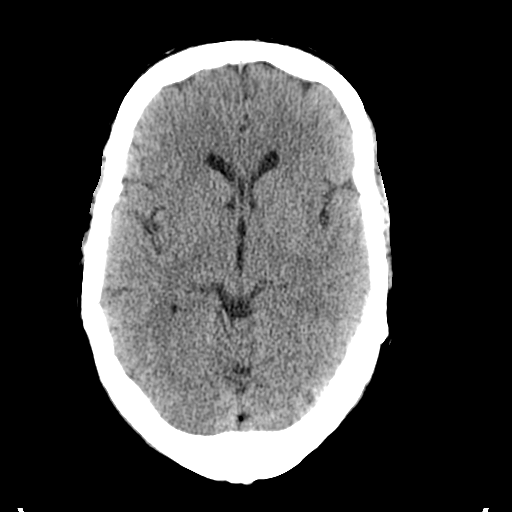
[im 19/36  brain]
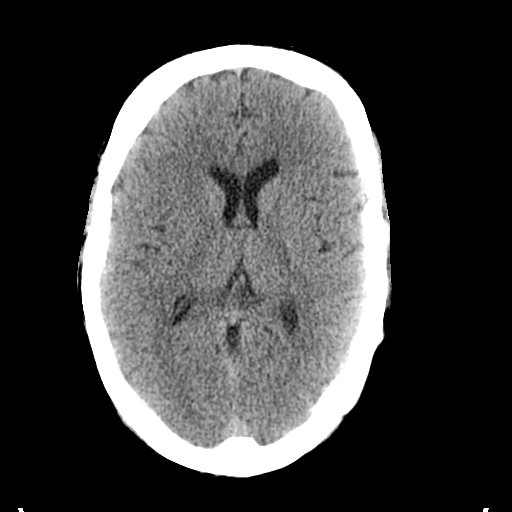
[im 19/36  bone]
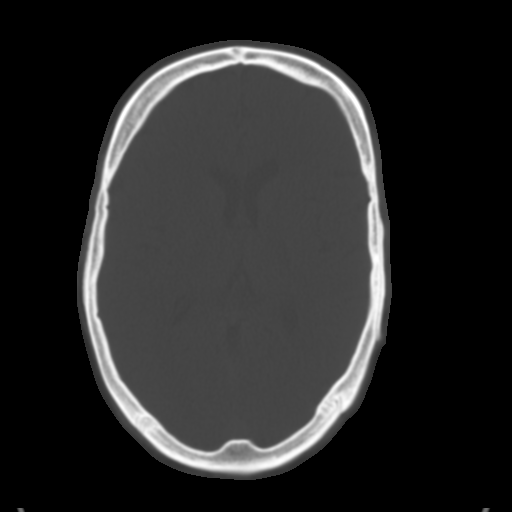
[im 21/36  brain]
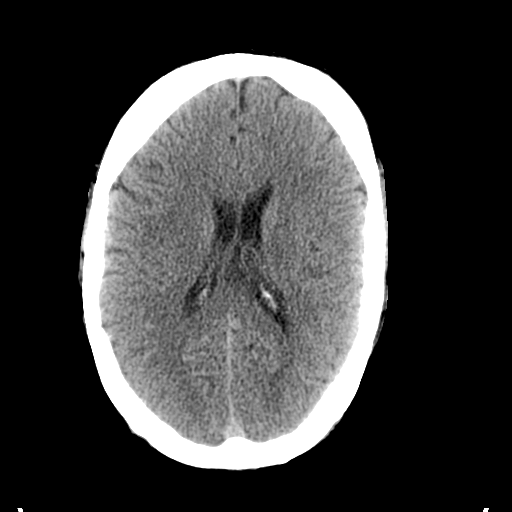
[im 24/36  brain]
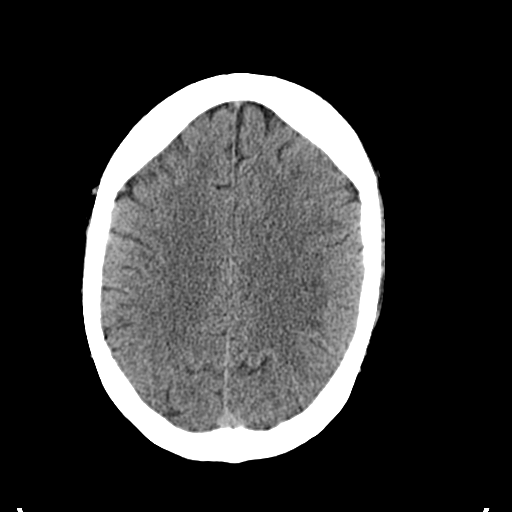
[im 26/36  brain]
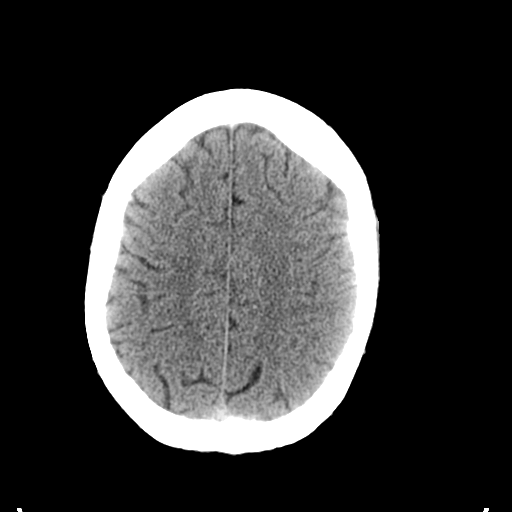
[im 27/36  brain]
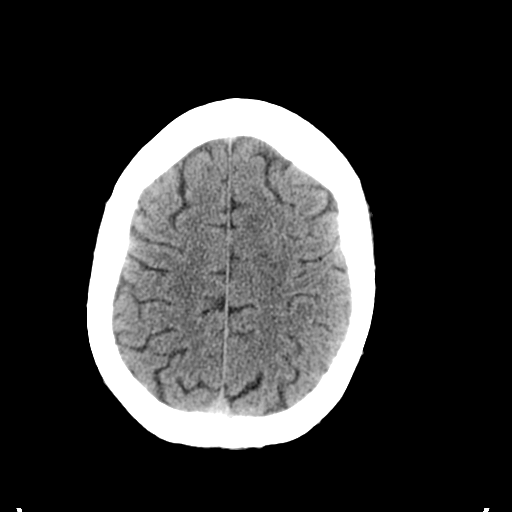
[im 27/36  bone]
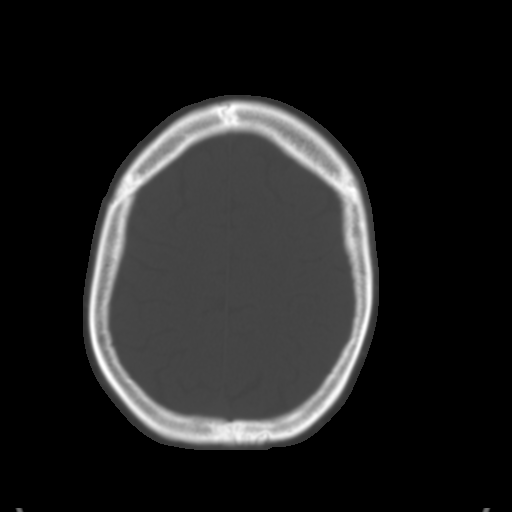
[im 29/36  brain]
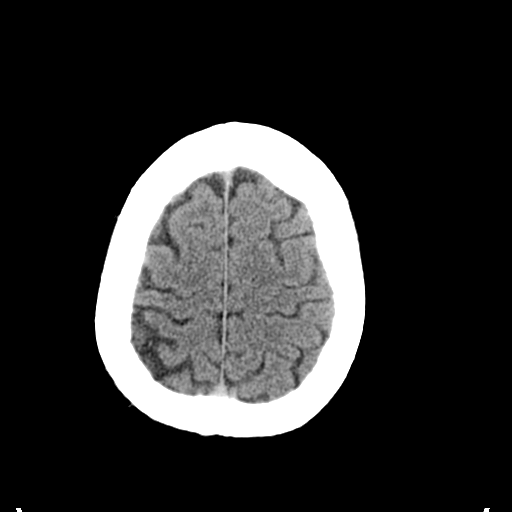
[im 32/36  brain]
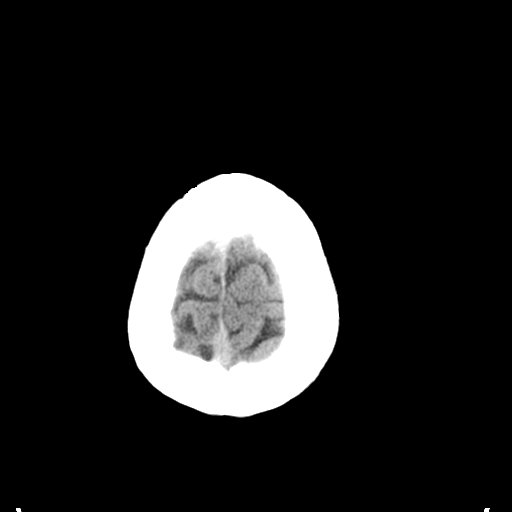
[im 34/36  brain]
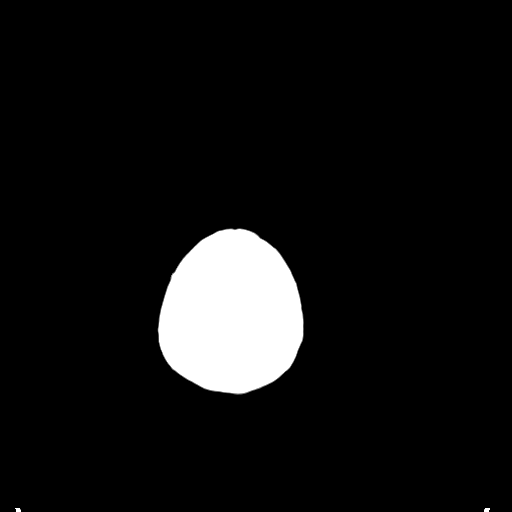

[16 of 30 positions shown; findings below may reference images not displayed]

FINDINGS: The brain has a normal appearance without evidence for
hemorrhage, infarction, hydrocephalus, or mass lesion.  There is no
extra axial fluid collection.  The skull and paranasal sinuses are
normal.
IMPRESSION: Normal exam

## 2012-08-19 ENCOUNTER — Encounter: Payer: Self-pay | Admitting: Internal Medicine

## 2012-08-19 ENCOUNTER — Ambulatory Visit (INDEPENDENT_AMBULATORY_CARE_PROVIDER_SITE_OTHER): Payer: BC Managed Care – PPO | Admitting: Internal Medicine

## 2012-08-19 VITALS — BP 132/91 | HR 88 | Ht 67.0 in | Wt 185.4 lb

## 2012-08-19 DIAGNOSIS — I442 Atrioventricular block, complete: Secondary | ICD-10-CM

## 2012-08-19 DIAGNOSIS — R55 Syncope and collapse: Secondary | ICD-10-CM

## 2012-08-19 DIAGNOSIS — I10 Essential (primary) hypertension: Secondary | ICD-10-CM

## 2012-08-19 DIAGNOSIS — Z950 Presence of cardiac pacemaker: Secondary | ICD-10-CM

## 2012-08-19 LAB — PACEMAKER DEVICE OBSERVATION
AL THRESHOLD: 0.75 V
ATRIAL PACING PM: 22
RV LEAD THRESHOLD: 0.75 V

## 2012-08-19 NOTE — Assessment & Plan Note (Signed)
The patient's device was interrogated and the information was fully reviewed.  The device was reprogrammed to lengthern av delay

## 2012-08-19 NOTE — Assessment & Plan Note (Signed)
reasonalby

## 2012-08-19 NOTE — Patient Instructions (Signed)
Your physician wants you to follow-up in: 6 months with the device clinic & 1 year with Dr. Klein. You will receive a reminder letter in the mail two months in advance. If you don't receive a letter, please call our office to schedule the follow-up appointment.  Your physician recommends that you continue on your current medications as directed. Please refer to the Current Medication list given to you today.    

## 2012-08-19 NOTE — Assessment & Plan Note (Signed)
No revurrent sncope
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
## 2012-08-19 NOTE — Assessment & Plan Note (Signed)
Device dependent patient stable

## 2012-10-06 ENCOUNTER — Ambulatory Visit (INDEPENDENT_AMBULATORY_CARE_PROVIDER_SITE_OTHER): Payer: BC Managed Care – PPO | Admitting: Cardiology

## 2012-10-06 ENCOUNTER — Encounter: Payer: Self-pay | Admitting: Cardiology

## 2012-10-06 VITALS — BP 135/92 | HR 94 | Ht 67.0 in | Wt 186.0 lb

## 2012-10-06 DIAGNOSIS — E785 Hyperlipidemia, unspecified: Secondary | ICD-10-CM

## 2012-10-06 DIAGNOSIS — R42 Dizziness and giddiness: Secondary | ICD-10-CM

## 2012-10-06 DIAGNOSIS — Z950 Presence of cardiac pacemaker: Secondary | ICD-10-CM

## 2012-10-06 DIAGNOSIS — I10 Essential (primary) hypertension: Secondary | ICD-10-CM

## 2012-10-06 NOTE — Assessment & Plan Note (Signed)
Management per electrophysiology. 

## 2012-10-06 NOTE — Assessment & Plan Note (Signed)
No further episodes.continue Mestinon.
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
## 2012-10-06 NOTE — Patient Instructions (Addendum)
Your physician wants you to follow-up in: ONE YEAR WITH DR CRENSHAW You will receive a reminder letter in the mail two months in advance. If you don't receive a letter, please call our office to schedule the follow-up appointment.  

## 2012-10-06 NOTE — Assessment & Plan Note (Signed)
Continue statin. 

## 2012-10-06 NOTE — Assessment & Plan Note (Signed)
Blood pressure mildly elevated but we are allowing his pressure to run higher given his history of orthostasis. He also states it is typically lower than it is today. We will follow.

## 2012-10-07 ENCOUNTER — Other Ambulatory Visit: Payer: Self-pay | Admitting: *Deleted

## 2012-10-07 MED ORDER — SIMVASTATIN 10 MG PO TABS: 10.0000 mg | ORAL_TABLET | Freq: Every day | ORAL | Status: DC

## 2012-10-08 ENCOUNTER — Other Ambulatory Visit: Payer: Self-pay | Admitting: *Deleted

## 2012-10-08 DIAGNOSIS — R55 Syncope and collapse: Secondary | ICD-10-CM

## 2012-10-08 MED ORDER — PYRIDOSTIGMINE BROMIDE 60 MG PO TABS: ORAL_TABLET | ORAL | Status: DC

## 2013-04-13 ENCOUNTER — Other Ambulatory Visit: Payer: Self-pay | Admitting: *Deleted

## 2013-04-13 MED ORDER — SIMVASTATIN 10 MG PO TABS: 10.0000 mg | ORAL_TABLET | Freq: Every day | ORAL | Status: DC

## 2013-08-17 ENCOUNTER — Other Ambulatory Visit: Payer: Self-pay | Admitting: Internal Medicine

## 2013-08-25 ENCOUNTER — Encounter: Payer: Self-pay | Admitting: Internal Medicine

## 2013-08-25 ENCOUNTER — Ambulatory Visit (INDEPENDENT_AMBULATORY_CARE_PROVIDER_SITE_OTHER): Payer: BC Managed Care – PPO | Admitting: Internal Medicine

## 2013-08-25 VITALS — BP 149/106 | HR 89 | Ht 66.0 in | Wt 172.0 lb

## 2013-08-25 DIAGNOSIS — Z950 Presence of cardiac pacemaker: Secondary | ICD-10-CM

## 2013-08-25 DIAGNOSIS — I442 Atrioventricular block, complete: Secondary | ICD-10-CM

## 2013-08-25 LAB — MDC_IDC_ENUM_SESS_TYPE_INCLINIC
Battery Voltage: 2.79 V
Brady Statistic AP VP Percent: 25 %
Brady Statistic AP VS Percent: 0 %
Brady Statistic AS VP Percent: 75 %
Lead Channel Impedance Value: 476 Ohm
Lead Channel Impedance Value: 501 Ohm
Lead Channel Pacing Threshold Amplitude: 0.75 V
Lead Channel Pacing Threshold Amplitude: 0.75 V
Lead Channel Sensing Intrinsic Amplitude: 4 mV
Lead Channel Setting Pacing Pulse Width: 0.4 ms
MDC IDC MSMT BATTERY IMPEDANCE: 185 Ohm
MDC IDC MSMT BATTERY REMAINING LONGEVITY: 114 mo
MDC IDC MSMT LEADCHNL RA PACING THRESHOLD PULSEWIDTH: 0.4 ms
MDC IDC MSMT LEADCHNL RV PACING THRESHOLD PULSEWIDTH: 0.4 ms
MDC IDC SESS DTM: 20150526155903
MDC IDC SET LEADCHNL RA PACING AMPLITUDE: 2 V
MDC IDC SET LEADCHNL RV PACING AMPLITUDE: 2.5 V
MDC IDC SET LEADCHNL RV SENSING SENSITIVITY: 2 mV
MDC IDC STAT BRADY AS VS PERCENT: 0 %

## 2013-08-25 MED ORDER — ASPIRIN 81 MG PO TBEC: 81.0000 mg | DELAYED_RELEASE_TABLET | Freq: Every day | ORAL | Status: DC

## 2013-08-25 NOTE — Patient Instructions (Addendum)
Your physician recommends that you schedule a follow-up appointment in: 6 months with [facility] and in 12 months with Dr.Klein  Please follow up with Dr. Jens Som in 3 months
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
## 2013-09-09 ENCOUNTER — Encounter: Payer: Self-pay | Admitting: Internal Medicine

## 2013-10-28 ENCOUNTER — Encounter: Payer: Self-pay | Admitting: Cardiology

## 2013-11-17 ENCOUNTER — Other Ambulatory Visit: Payer: Self-pay | Admitting: Internal Medicine

## 2014-01-19 ENCOUNTER — Ambulatory Visit (INDEPENDENT_AMBULATORY_CARE_PROVIDER_SITE_OTHER): Payer: BC Managed Care – PPO | Admitting: Cardiology

## 2014-01-19 ENCOUNTER — Encounter: Payer: Self-pay | Admitting: Cardiology

## 2014-01-19 VITALS — BP 127/97 | HR 81 | Ht 65.0 in | Wt 182.5 lb

## 2014-01-19 DIAGNOSIS — R55 Syncope and collapse: Secondary | ICD-10-CM

## 2014-01-19 DIAGNOSIS — Z950 Presence of cardiac pacemaker: Secondary | ICD-10-CM

## 2014-01-19 DIAGNOSIS — I10 Essential (primary) hypertension: Secondary | ICD-10-CM

## 2014-01-19 MED ORDER — LOSARTAN POTASSIUM 25 MG PO TABS: 25.0000 mg | ORAL_TABLET | Freq: Every day | ORAL | Status: DC

## 2014-01-19 NOTE — Assessment & Plan Note (Signed)
No recurrent episodes. 

## 2014-01-19 NOTE — Patient Instructions (Signed)
Your physician wants you to follow-up in: 6 MONTHS WITH DR Jens Som You will receive a reminder letter in the mail two months in advance. If you don't receive a letter, please call our office to schedule the follow-up appointment.   START LOSARTAN 25 MG ONCE DAILY  Your physician recommends that you return for lab work in: ONE WEEK  TRACK BLOOD PRESSURE= <140/<85

## 2014-01-19 NOTE — Assessment & Plan Note (Signed)
Continue statin. Lipids and liver monitored by primary care. 

## 2014-01-19 NOTE — Assessment & Plan Note (Signed)
Blood pressure mildly elevated. He is not having orthostatic syncope at present. We will add Cozaar 25 mg daily. Check potassium and renal function in one week. We will try to balance treating his hypertension with orthostatic symptoms.

## 2014-01-19 NOTE — Assessment & Plan Note (Signed)
Followed by electrophysiology. 
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
## 2014-02-10 ENCOUNTER — Ambulatory Visit (INDEPENDENT_AMBULATORY_CARE_PROVIDER_SITE_OTHER): Payer: BC Managed Care – PPO | Admitting: *Deleted

## 2014-02-10 DIAGNOSIS — I442 Atrioventricular block, complete: Secondary | ICD-10-CM

## 2014-02-10 LAB — MDC_IDC_ENUM_SESS_TYPE_INCLINIC
Battery Remaining Longevity: 76 mo
Brady Statistic AP VP Percent: 21 %
Brady Statistic AS VP Percent: 79 %
Brady Statistic AS VS Percent: 0 %
Date Time Interrogation Session: 20151111100526
Lead Channel Impedance Value: 469 Ohm
Lead Channel Impedance Value: 473 Ohm
Lead Channel Sensing Intrinsic Amplitude: 4 mV
Lead Channel Setting Pacing Amplitude: 2 V
Lead Channel Setting Pacing Amplitude: 2.5 V
Lead Channel Setting Pacing Pulse Width: 0.4 ms
MDC IDC MSMT BATTERY IMPEDANCE: 258 Ohm
MDC IDC MSMT BATTERY VOLTAGE: 2.79 V
MDC IDC MSMT LEADCHNL RV PACING THRESHOLD AMPLITUDE: 0.75 V
MDC IDC MSMT LEADCHNL RV PACING THRESHOLD PULSEWIDTH: 0.4 ms
MDC IDC SET LEADCHNL RV SENSING SENSITIVITY: 2 mV
MDC IDC STAT BRADY AP VS PERCENT: 0 %

## 2014-02-10 NOTE — Progress Notes (Signed)
Pacemaker check in clinic. Normal device function. Threshold, sensing, and impedances consistent with previous measurements. Device programmed to maximize longevity. No atrial high rate episodes recorded, although pt is currently in an AF/AFL x an unknown duration + ASA---h/o TIA. Mode switch was turned on today to collect AF data and so appropriate mode switching can occur. Rate drop was D/C'd in 2013. Pt denies edema, SOB, or fatigue. Pt only c/o pre syncopal spells at times. 1 NSVT episode noted x 8 bts @ 74/202. Device programmed at appropriate safety margins. Histogram distribution appropriate for patient activity level. Device programmed to optimize intrinsic conduction. Estimated longevity 6.5 years. Patient will follow up with SK in 6 months.

## 2014-02-17 ENCOUNTER — Telehealth: Payer: Self-pay | Admitting: *Deleted

## 2014-02-17 NOTE — Telephone Encounter (Signed)
Pt returned my call. I informed him that Dr.Klein wants him to come in for an appt due to his recent AF (discuss A/C). Pt voiced understanding. Pt states that since he's at work he would prefer to call back later today to discuss scheduling of the appt. Info also given to [employer].

## 2014-02-18 ENCOUNTER — Encounter: Payer: Self-pay | Admitting: Internal Medicine

## 2014-02-18 ENCOUNTER — Ambulatory Visit (INDEPENDENT_AMBULATORY_CARE_PROVIDER_SITE_OTHER): Payer: BC Managed Care – PPO | Admitting: Internal Medicine

## 2014-02-18 VITALS — BP 126/84 | HR 103 | Ht 67.0 in | Wt 178.0 lb

## 2014-02-18 DIAGNOSIS — I4892 Unspecified atrial flutter: Secondary | ICD-10-CM

## 2014-02-18 DIAGNOSIS — I10 Essential (primary) hypertension: Secondary | ICD-10-CM

## 2014-02-18 DIAGNOSIS — I442 Atrioventricular block, complete: Secondary | ICD-10-CM

## 2014-02-18 DIAGNOSIS — Z45018 Encounter for adjustment and management of other part of cardiac pacemaker: Secondary | ICD-10-CM | POA: Diagnosis not present

## 2014-02-18 NOTE — Patient Instructions (Signed)
Your physician recommends that you continue on your current medications as directed. Please refer to the Current Medication list given to you today.  Your physician wants you to follow-up in: 6 months with Dr. Klein. You will receive a reminder letter in the mail two months in advance. If you don't receive a letter, please call our office to schedule the follow-up appointment.  
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
## 2014-02-23 ENCOUNTER — Encounter: Payer: Self-pay | Admitting: Internal Medicine

## 2014-02-23 ENCOUNTER — Ambulatory Visit (INDEPENDENT_AMBULATORY_CARE_PROVIDER_SITE_OTHER): Payer: BC Managed Care – PPO | Admitting: Internal Medicine

## 2014-02-23 VITALS — BP 122/82 | HR 87 | Temp 97.8°F | Wt 182.0 lb

## 2014-02-23 DIAGNOSIS — Z23 Encounter for immunization: Secondary | ICD-10-CM

## 2014-02-23 DIAGNOSIS — Z0000 Encounter for general adult medical examination without abnormal findings: Secondary | ICD-10-CM

## 2014-02-23 NOTE — Patient Instructions (Addendum)
It was good to see you today.  We have reviewed your prior records including labs and tests today  Annual influenza immunization and 10 year Tdap (tetanus, diphtheria and pertussis) updated today. Other Health Maintenance reviewed - all other recommended immunizations and age-appropriate screenings are up-to-date.  Test(s) ordered today. Your results will be released to MyChart (or called to you) after review, usually within 72hours after test completion. If any changes need to be made, you will be notified at that same time.  Medications reviewed and updated, no changes recommended at this time.  Please schedule followup in 12 months for annual exam and labs, call sooner if problems.  Health Maintenance A healthy lifestyle and preventative care can promote health and wellness.  Maintain regular health, dental, and eye exams.  Eat a healthy diet. Foods like vegetables, fruits, whole grains, low-fat dairy products, and lean protein foods contain the nutrients you need and are low in calories. Decrease your intake of foods high in solid fats, added sugars, and salt. Get information about a proper diet from your health care provider, if necessary.  Regular physical exercise is one of the most important things you can do for your health. Most adults should get at least 150 minutes of moderate-intensity exercise (any activity that increases your heart rate and causes you to sweat) each week. In addition, most adults need muscle-strengthening exercises on 2 or more days a week.   Maintain a healthy weight. The body mass index (BMI) is a screening tool to identify possible weight problems. It provides an estimate of body fat based on height and weight. Your health care provider can find your BMI and can help you achieve or maintain a healthy weight. For males 20 years and older:  A BMI below 18.5 is considered underweight.  A BMI of 18.5 to 24.9 is normal.  A BMI of 25 to 29.9 is considered  overweight.  A BMI of 30 and above is considered obese.  Maintain normal blood lipids and cholesterol by exercising and minimizing your intake of saturated fat. Eat a balanced diet with plenty of fruits and vegetables. Blood tests for lipids and cholesterol should begin at age 71 and be repeated every 5 years. If your lipid or cholesterol levels are high, you are over age 69, or you are at high risk for heart disease, you may need your cholesterol levels checked more frequently.Ongoing high lipid and cholesterol levels should be treated with medicines if diet and exercise are not working.  If you smoke, find out from your health care provider how to quit. If you do not use tobacco, do not start.  Lung cancer screening is recommended for adults aged 55-80 years who are at high risk for developing lung cancer because of a history of smoking. A yearly low-dose CT scan of the lungs is recommended for people who have at least a 30-pack-year history of smoking and are current smokers or have quit within the past 15 years. A pack year of smoking is smoking an average of 1 pack of cigarettes a day for 1 year (for example, a 30-pack-year history of smoking could mean smoking 1 pack a day for 30 years or 2 packs a day for 15 years). Yearly screening should continue until the smoker has stopped smoking for at least 15 years. Yearly screening should be stopped for people who develop a health problem that would prevent them from having lung cancer treatment.  If you choose to drink alcohol, do not  have more than 2 drinks per day. One drink is considered to be 12 oz (360 mL) of beer, 5 oz (150 mL) of wine, or 1.5 oz (45 mL) of liquor.  Avoid the use of street drugs. Do not share needles with anyone. Ask for help if you need support or instructions about stopping the use of drugs.  High blood pressure causes heart disease and increases the risk of stroke. Blood pressure should be checked at least every 1-2 years.  Ongoing high blood pressure should be treated with medicines if weight loss and exercise are not effective.  If you are 65-81 years old, ask your health care provider if you should take aspirin to prevent heart disease.  Diabetes screening involves taking a blood sample to check your fasting blood sugar level. This should be done once every 3 years after age 16 if you are at a normal weight and without risk factors for diabetes. Testing should be considered at a younger age or be carried out more frequently if you are overweight and have at least 1 risk factor for diabetes.  Colorectal cancer can be detected and often prevented. Most routine colorectal cancer screening begins at the age of 70 and continues through age 87. However, your health care provider may recommend screening at an earlier age if you have risk factors for colon cancer. On a yearly basis, your health care provider may provide home test kits to check for hidden blood in the stool. A small camera at the end of a tube may be used to directly examine the colon (sigmoidoscopy or colonoscopy) to detect the earliest forms of colorectal cancer. Talk to your health care provider about this at age 106 when routine screening begins. A direct exam of the colon should be repeated every 5-10 years through age 34, unless early forms of precancerous polyps or small growths are found.  People who are at an increased risk for hepatitis B should be screened for this virus. You are considered at high risk for hepatitis B if:  You were born in a country where hepatitis B occurs often. Talk with your health care provider about which countries are considered high risk.  Your parents were born in a high-risk country and you have not received a shot to protect against hepatitis B (hepatitis B vaccine).  You have HIV or AIDS.  You use needles to inject street drugs.  You live with, or have sex with, someone who has hepatitis B.  You are a man who has  sex with other men (MSM).  You get hemodialysis treatment.  You take certain medicines for conditions like cancer, organ transplantation, and autoimmune conditions.  Hepatitis C blood testing is recommended for all people born from [birth year] through 1964 and any individual with known risk factors for hepatitis C.  Healthy men should no longer receive prostate-specific antigen (PSA) blood tests as part of routine cancer screening. Talk to your health care provider about prostate cancer screening.  Testicular cancer screening is not recommended for adolescents or adult males who have no symptoms. Screening includes self-exam, a health care provider exam, and other screening tests. Consult with your health care provider about any symptoms you have or any concerns you have about testicular cancer.  Practice safe sex. Use condoms and avoid high-risk sexual practices to reduce the spread of sexually transmitted infections (STIs).  You should be screened for STIs, including gonorrhea and chlamydia if:  You are sexually active and are younger than 45  years.  Bonita QuinYou are older than 24 years, and your health care provider tells you that you are at risk for this type of infection.  Your sexual activity has changed since you were last screened, and you are at an increased risk for chlamydia or gonorrhea. Ask your health care provider if you are at risk.  If you are at risk of being infected with HIV, it is recommended that you take a prescription medicine daily to prevent HIV infection. This is called pre-exposure prophylaxis (PrEP). You are considered at risk if:  You are a man who has sex with other men (MSM).  You are a heterosexual man who is sexually active with multiple partners.  You take drugs by injection.  You are sexually active with a partner who has HIV.  Talk with your health care provider about whether you are at high risk of being infected with HIV. If you choose to begin PrEP, you should  first be tested for HIV. You should then be tested every 3 months for as long as you are taking PrEP.  Use sunscreen. Apply sunscreen liberally and repeatedly throughout the day. You should seek shade when your shadow is shorter than you. Protect yourself by wearing long sleeves, pants, a wide-brimmed hat, and sunglasses year round whenever you are outdoors.  Tell your health care provider of new moles or changes in moles, especially if there is a change in shape or color. Also, tell your health care provider if a mole is larger than the size of a pencil eraser.  A one-time screening for abdominal aortic aneurysm (AAA) and surgical repair of large AAAs by ultrasound is recommended for men aged 65-75 years who are current or former smokers.  Stay current with your vaccines (immunizations). Document Released: 09/15/2007 Document Revised: 03/24/2013 Document Reviewed: 08/14/2010 [facility] Patient Information 2015 [place], [place]. This information is not intended to replace advice given to you by your health care provider. Make sure you discuss any questions you have with your health care provider.

## 2014-02-23 NOTE — Progress Notes (Signed)
Pre visit review using our clinic review tool, if applicable. No additional management support is needed unless otherwise documented below in the visit note. 

## 2014-02-23 NOTE — Progress Notes (Signed)
Subjective:    Patient ID: William Hartman, male    DOB: [birth year], 48 y.o.   MRN: [id number]  HPI  patient is here today for annual physical. Patient feels well and has no complaints.  Also reviewed chronic medical issues and interval medical events  Past Medical History  Diagnosis Date  . HYPERLIPIDEMIA   . ARTHROGRYPOSIS MULTIPLEX CONGENITA   . Syncope and collapse 06/2011    PPM placed due to intermittent CHB  . HYPERTENSION   . Syncope and collapse     PT reported to EMS he has had multiple  episodes of  syncope.  Marland Kitchen. Neuromuscular disorder   . Pacemaker 07/02/2011  . DDD (degenerative disc disease), lumbar   . Third degree heart block 07/02/11    pacemaker placed  . Internal carotid artery stenosis 09/2011    Left proximal ICA stenosis of 20% on CTA  - ordered because of a spell of dysarthria   Family History  Problem Relation Age of Onset  . Adopted: Yes  . Lung cancer Mother     adoptive mom  . Breast cancer Mother     adoptive mom  . Kidney disease Father     adoptive dad   History  Substance Use Topics  . Smoking status: Never Smoker   . Smokeless tobacco: Never Used  . Alcohol Use: 0.6 oz/week    1 Glasses of wine per week     Comment: "not much"    Review of Systems  Constitutional: Negative for fever, activity change, appetite change, fatigue and unexpected weight change.  Respiratory: Negative for cough, chest tightness, shortness of breath and wheezing.   Cardiovascular: Negative for chest pain, palpitations and leg swelling.  Neurological: Positive for dizziness (episodic events). Negative for weakness and headaches.  Psychiatric/Behavioral: Negative for dysphoric mood. The patient is not nervous/anxious.   All other systems reviewed and are negative.      Objective:   Physical Exam  Pulse 87  Temp(Src) 97.8 F (36.6 C) (Oral)  Wt 182 lb (82.555 kg)  SpO2 96% Wt Readings from Last 3 Encounters:  02/23/14 182 lb (82.555 kg)  02/18/14 178 lb  (80.74 kg)  01/19/14 182 lb 8 oz (82.781 kg)   Constitutional: He appears well-nourished. No distress. 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 Our parents clear with fair dentition. Eyes: Pupils equally round and reactive, EOMI. Vision grossly intact. No conjunctival irritation or scleral abnormalities Neck: thick, supple, FROM. No carotid bruits or LAD. No thyroid enlargement or tenderness. Cardiovascular: Normal rate, regular rhythm and normal heart sounds. No murmur or rub heard. No BLE edema Pulmonary/Chest: Effort normal and breath sounds normal. No respiratory distress. He has no wheezes.  Abdomen: Soft, nontender and nondistended, bowel sounds present throughout Neurological: he is alert and oriented to person, place, and time. No cranial nerve deficit. Balance and coordination normal/baseline - Gait markedly affected by MSKel shortening and contractures (chronic). Skin: bluish discoloration chronic medial distal legs BLE above ankle - no ulceration or wounds Psychiatric: he has a normal mood and affect. behavior is normal. Judgment and thought content normal.    Lab Results  Component Value Date   WBC 12.2* 11/17/2011   HGB 14.8 11/17/2011   HCT 42.6 11/17/2011   PLT 367 11/17/2011   GLUCOSE 113* 11/17/2011   CHOL 134 07/27/2011   TRIG 121 07/27/2011   HDL 32* 07/27/2011   LDLDIRECT 153.0 03/28/2010   LDLCALC 78  07/27/2011   ALT 43 12/28/2010   AST 35 12/28/2010   NA 138 11/17/2011   K 4.0 11/17/2011   CL 104 11/17/2011   CREATININE 0.61 11/17/2011   BUN 18 11/17/2011   CO2 20 11/17/2011   TSH 0.875 07/26/2011   PSA 2.14 07/24/2011   INR 1.13 07/01/2011   HGBA1C 5.8* 07/26/2011    Dg Chest 2 View  11/17/2011   *RADIOLOGY REPORT*  Clinical Data: Weakness and shortness of breath.  Dizziness. History of hypertension, elevated cholesterol.  Nonsmoker.  CHEST - 2 VIEW  Comparison: 07/26/2011  Findings: The patient has  right-sided transvenous pacemaker.  Leads overlie the right atrium, right ventricle.  The heart size is normal.  There are no focal consolidations or pleural effusions. No pulmonary edema.  Degenerative changes are seen in the spine.  IMPRESSION: No evidence for acute cardiopulmonary abnormality.  Original Report Authenticated By: Patterson Hammersmith, M.D.      Assessment & Plan:   CPX/z00.00 - Patient has been counseled on age-appropriate routine health concerns for screening and prevention. These are reviewed and up-to-date. Immunizations are up-to-date or declined. Labs ordered and reviewed.

## 2014-02-24 ENCOUNTER — Encounter: Payer: Self-pay | Admitting: Internal Medicine

## 2014-02-26 ENCOUNTER — Other Ambulatory Visit (INDEPENDENT_AMBULATORY_CARE_PROVIDER_SITE_OTHER): Payer: BC Managed Care – PPO

## 2014-02-26 DIAGNOSIS — Z0000 Encounter for general adult medical examination without abnormal findings: Secondary | ICD-10-CM

## 2014-02-26 LAB — CBC WITH DIFFERENTIAL/PLATELET
Basophils Absolute: 0.1 10*3/uL (ref 0.0–0.1)
Basophils Relative: 0.5 % (ref 0.0–3.0)
EOS PCT: 4.6 % (ref 0.0–5.0)
Eosinophils Absolute: 0.5 10*3/uL (ref 0.0–0.7)
HCT: 46.8 % (ref 39.0–52.0)
Hemoglobin: 15.7 g/dL (ref 13.0–17.0)
LYMPHS ABS: 4 10*3/uL (ref 0.7–4.0)
LYMPHS PCT: 34.6 % (ref 12.0–46.0)
MCHC: 33.4 g/dL (ref 30.0–36.0)
MCV: 91.8 fl (ref 78.0–100.0)
MONOS PCT: 7.9 % (ref 3.0–12.0)
Monocytes Absolute: 0.9 10*3/uL (ref 0.1–1.0)
Neutro Abs: 6.1 10*3/uL (ref 1.4–7.7)
Neutrophils Relative %: 52.4 % (ref 43.0–77.0)
PLATELETS: 337 10*3/uL (ref 150.0–400.0)
RBC: 5.1 Mil/uL (ref 4.22–5.81)
RDW: 14 % (ref 11.5–15.5)
WBC: 11.6 10*3/uL — ABNORMAL HIGH (ref 4.0–10.5)

## 2014-02-26 LAB — HEPATIC FUNCTION PANEL
ALK PHOS: 69 U/L (ref 39–117)
ALT: 20 U/L (ref 0–53)
AST: 20 U/L (ref 0–37)
Albumin: 4.3 g/dL (ref 3.5–5.2)
BILIRUBIN TOTAL: 0.7 mg/dL (ref 0.2–1.2)
Bilirubin, Direct: 0 mg/dL (ref 0.0–0.3)
Total Protein: 8.1 g/dL (ref 6.0–8.3)

## 2014-02-26 LAB — URINALYSIS, ROUTINE W REFLEX MICROSCOPIC
Bilirubin Urine: NEGATIVE
HGB URINE DIPSTICK: NEGATIVE
Ketones, ur: NEGATIVE
LEUKOCYTES UA: NEGATIVE
Nitrite: NEGATIVE
RBC / HPF: NONE SEEN (ref 0–?)
Specific Gravity, Urine: 1.02 (ref 1.000–1.030)
TOTAL PROTEIN, URINE-UPE24: NEGATIVE
Urine Glucose: NEGATIVE
Urobilinogen, UA: 0.2 (ref 0.0–1.0)
WBC UA: NONE SEEN (ref 0–?)
pH: 5.5 (ref 5.0–8.0)

## 2014-02-26 LAB — LIPID PANEL
CHOLESTEROL: 207 mg/dL — AB (ref 0–200)
HDL: 33.4 mg/dL — ABNORMAL LOW (ref >39.00)
NonHDL: 173.6
Total CHOL/HDL Ratio: 6
Triglycerides: 296 mg/dL — ABNORMAL HIGH (ref 0.0–149.0)
VLDL: 59.2 mg/dL — AB (ref 0.0–40.0)

## 2014-02-26 LAB — BASIC METABOLIC PANEL
BUN: 20 mg/dL (ref 6–23)
CHLORIDE: 109 meq/L (ref 96–112)
CO2: 19 meq/L (ref 19–32)
CREATININE: 0.7 mg/dL (ref 0.4–1.5)
Calcium: 9.4 mg/dL (ref 8.4–10.5)
GFR: 137.28 mL/min (ref >60.00)
GLUCOSE: 124 mg/dL — AB (ref 70–99)
Potassium: 4 mEq/L (ref 3.5–5.1)
Sodium: 138 mEq/L (ref 135–145)

## 2014-02-26 LAB — LDL CHOLESTEROL, DIRECT: Direct LDL: 133.1 mg/dL

## 2014-02-26 LAB — TSH: TSH: 1.46 u[IU]/mL (ref 0.35–4.50)

## 2014-03-11 ENCOUNTER — Encounter (HOSPITAL_COMMUNITY): Payer: Self-pay | Admitting: Internal Medicine

## 2014-03-24 ENCOUNTER — Encounter: Payer: Self-pay | Admitting: Internal Medicine

## 2014-07-28 ENCOUNTER — Encounter: Payer: Self-pay | Admitting: *Deleted

## 2014-09-24 ENCOUNTER — Ambulatory Visit (INDEPENDENT_AMBULATORY_CARE_PROVIDER_SITE_OTHER): Payer: BC Managed Care – PPO | Admitting: Internal Medicine

## 2014-09-24 ENCOUNTER — Encounter: Payer: Self-pay | Admitting: Internal Medicine

## 2014-09-24 ENCOUNTER — Telehealth: Payer: Self-pay | Admitting: *Deleted

## 2014-09-24 VITALS — BP 124/72 | HR 83 | Ht 67.0 in | Wt 182.0 lb

## 2014-09-24 DIAGNOSIS — I4892 Unspecified atrial flutter: Secondary | ICD-10-CM

## 2014-09-24 DIAGNOSIS — Z45018 Encounter for adjustment and management of other part of cardiac pacemaker: Secondary | ICD-10-CM

## 2014-09-24 DIAGNOSIS — I442 Atrioventricular block, complete: Secondary | ICD-10-CM

## 2014-09-24 DIAGNOSIS — Z950 Presence of cardiac pacemaker: Secondary | ICD-10-CM | POA: Diagnosis not present

## 2014-09-24 LAB — CUP PACEART INCLINIC DEVICE CHECK
Battery Voltage: 2.76 V
Brady Statistic AP VS Percent: 0 %
Brady Statistic AS VP Percent: 90 %
Date Time Interrogation Session: 20160624092900
Lead Channel Impedance Value: 511 Ohm
Lead Channel Pacing Threshold Pulse Width: 0.4 ms
Lead Channel Pacing Threshold Pulse Width: 1 ms
Lead Channel Setting Pacing Amplitude: 2 V
Lead Channel Setting Pacing Pulse Width: 1 ms
MDC IDC MSMT BATTERY IMPEDANCE: 431 Ohm
MDC IDC MSMT BATTERY REMAINING LONGEVITY: 32 mo
MDC IDC MSMT LEADCHNL RA IMPEDANCE VALUE: 432 Ohm
MDC IDC MSMT LEADCHNL RA PACING THRESHOLD AMPLITUDE: 0.75 V
MDC IDC MSMT LEADCHNL RA SENSING INTR AMPL: 4 mV
MDC IDC MSMT LEADCHNL RV PACING THRESHOLD AMPLITUDE: 0.5 V
MDC IDC SET LEADCHNL RV PACING AMPLITUDE: 2.5 V
MDC IDC SET LEADCHNL RV SENSING SENSITIVITY: 2 mV
MDC IDC STAT BRADY AP VP PERCENT: 10 %
MDC IDC STAT BRADY AS VS PERCENT: 0 %

## 2014-09-24 NOTE — Patient Instructions (Signed)
Medication Instructions:  Your physician recommends that you continue on your current medications as directed. Please refer to the Current Medication list given to you today.  Labwork: None ordered  Testing/Procedures: None ordered  Follow-Up: Your physician wants you to follow-up in: 6 months with device clinic. You will receive a reminder letter in the mail two months in advance. If you don't receive a letter, please call our office to schedule the follow-up appointment.  Your physician wants you to follow-up in: 1 year with Dr. Graciela Husbands.  You will receive a reminder letter in the mail two months in advance. If you don't receive a letter, please call our office to schedule the follow-up appointment.   Any Other Special Instructions Will Be Listed Below (If Applicable).

## 2014-09-24 NOTE — Telephone Encounter (Signed)
Made appt w/ industry William Hartman) to analyze inconsistent RV capture mngt. Pt did present w/ output of 5.0V@1 .46ms on 09/24/14.   Re: concern is either lead malfunction issue or lead/tissue interface.
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
## 2014-10-22 ENCOUNTER — Ambulatory Visit (INDEPENDENT_AMBULATORY_CARE_PROVIDER_SITE_OTHER): Payer: BC Managed Care – PPO

## 2014-10-22 ENCOUNTER — Ambulatory Visit (INDEPENDENT_AMBULATORY_CARE_PROVIDER_SITE_OTHER): Payer: BC Managed Care – PPO | Admitting: *Deleted

## 2014-10-22 DIAGNOSIS — I442 Atrioventricular block, complete: Secondary | ICD-10-CM | POA: Diagnosis not present

## 2014-10-22 LAB — CUP PACEART INCLINIC DEVICE CHECK
Battery Impedance: 431 Ohm
Battery Voltage: 2.77 V
Brady Statistic AP VP Percent: 12 %
Brady Statistic AP VS Percent: 0 %
Brady Statistic AS VP Percent: 88 %
Brady Statistic AS VS Percent: 0 %
Lead Channel Impedance Value: 450 Ohm
Lead Channel Impedance Value: 482 Ohm
Lead Channel Pacing Threshold Amplitude: 0.5 V
Lead Channel Pacing Threshold Pulse Width: 0.76 ms
Lead Channel Sensing Intrinsic Amplitude: 4 mV
Lead Channel Setting Pacing Amplitude: 2 V
Lead Channel Setting Pacing Amplitude: 2.5 V
Lead Channel Setting Pacing Pulse Width: 1 ms
MDC IDC MSMT BATTERY REMAINING LONGEVITY: 69 mo
MDC IDC SESS DTM: 20160722112100
MDC IDC SET LEADCHNL RV SENSING SENSITIVITY: 2.8 mV

## 2014-10-22 NOTE — Progress Notes (Signed)
Pacemaker check in clinic due to inconsistent RV capture management William Hartman, MDT checked). Normal device function. Thresholds, sensing, impedances consistent with previous measurements. RV capture management changed from "adaptive" to "monitor only", test time changed from 2300 to 0000, RV sensitivity decreased from 2.17mV to 2.102mV, RV output changed from 4.5V @ 0.76 to 2.5V @ 1.37ms (threshold 0.5V @0 .4ms). Patient to wear 48 hour holter to confirm consistent RV capture. Device programmed to maximize longevity. No mode switch or high ventricular rates noted. Histogram distribution appropriate for patient activity level. Device programmed to optimize intrinsic conduction. Estimated longevity 7 years .Patient will follow up via [facility] on 10/24 and with SK in 09-2015.

## 2014-12-21 ENCOUNTER — Encounter: Payer: Self-pay | Admitting: Internal Medicine

## 2014-12-29 ENCOUNTER — Telehealth: Payer: Self-pay | Admitting: Internal Medicine

## 2014-12-29 NOTE — Telephone Encounter (Signed)
Patients wife called stating that she has talked with you and states you told her you would take her on as a new patient. Please advise

## 2014-12-30 NOTE — Telephone Encounter (Signed)
This is true - And unfortunately, since patient and I spoke, I am further reducing my clinic hours in near future and will not have availablity to see ANY new patients I am very sorry for the change in plans I would highly recommended Dr Okey Dupre or Lawerance Bach thanks

## 2014-12-30 NOTE — Telephone Encounter (Signed)
appt made with Dr. Lawerance Bach

## 2015-01-24 ENCOUNTER — Encounter: Payer: BC Managed Care – PPO | Admitting: *Deleted

## 2015-01-24 ENCOUNTER — Telehealth: Payer: Self-pay | Admitting: Cardiology

## 2015-01-24 NOTE — Telephone Encounter (Signed)
LMOVM reminding pt to send remote transmission.   

## 2015-01-25 ENCOUNTER — Encounter: Payer: Self-pay | Admitting: Cardiology

## 2015-02-04 ENCOUNTER — Other Ambulatory Visit: Payer: Self-pay | Admitting: Cardiology

## 2015-04-04 ENCOUNTER — Other Ambulatory Visit: Payer: Self-pay | Admitting: Cardiology

## 2015-04-05 NOTE — Telephone Encounter (Signed)
Rx request sent to pharmacy.  

## 2015-04-14 ENCOUNTER — Ambulatory Visit (INDEPENDENT_AMBULATORY_CARE_PROVIDER_SITE_OTHER): Payer: BC Managed Care – PPO | Admitting: *Deleted

## 2015-04-14 ENCOUNTER — Encounter: Payer: Self-pay | Admitting: Internal Medicine

## 2015-04-14 DIAGNOSIS — I442 Atrioventricular block, complete: Secondary | ICD-10-CM

## 2015-04-14 DIAGNOSIS — Z45018 Encounter for adjustment and management of other part of cardiac pacemaker: Secondary | ICD-10-CM | POA: Diagnosis not present

## 2015-04-14 LAB — CUP PACEART INCLINIC DEVICE CHECK
Battery Impedance: 505 Ohm
Battery Voltage: 2.78 V
Brady Statistic AP VS Percent: 0 %
Brady Statistic AS VP Percent: 88 %
Date Time Interrogation Session: 20170112095120
Implantable Lead Implant Date: 20130401
Implantable Lead Location: 753859
Lead Channel Impedance Value: 461 Ohm
Lead Channel Pacing Threshold Amplitude: 0.875 V
Lead Channel Pacing Threshold Pulse Width: 0.4 ms
Lead Channel Pacing Threshold Pulse Width: 0.4 ms
Lead Channel Pacing Threshold Pulse Width: 1 ms
Lead Channel Sensing Intrinsic Amplitude: 4 mV
Lead Channel Setting Pacing Amplitude: 2.5 V
Lead Channel Setting Pacing Pulse Width: 1 ms
Lead Channel Setting Sensing Sensitivity: 2.8 mV
MDC IDC LEAD IMPLANT DT: 20130401
MDC IDC LEAD LOCATION: 753860
MDC IDC MSMT BATTERY REMAINING LONGEVITY: 63 mo
MDC IDC MSMT LEADCHNL RA IMPEDANCE VALUE: 450 Ohm
MDC IDC MSMT LEADCHNL RA PACING THRESHOLD AMPLITUDE: 0.75 V
MDC IDC MSMT LEADCHNL RV PACING THRESHOLD AMPLITUDE: 0.5 V
MDC IDC SET LEADCHNL RA PACING AMPLITUDE: 2 V
MDC IDC STAT BRADY AP VP PERCENT: 12 %
MDC IDC STAT BRADY AS VS PERCENT: 0 %

## 2015-04-14 NOTE — Progress Notes (Signed)
Pacemaker check in clinic. Normal device function. Thresholds, sensing, impedances consistent with previous measurements. Device programmed to maximize longevity. No mode switches. 1 high ventricular rate- 9 beats, markers only. Device programmed at appropriate safety margins. Histogram distribution appropriate for patient activity level. Device programmed to optimize intrinsic conduction. Estimated longevity 4-6.5 years. ROV with SK In June.

## 2015-04-25 ENCOUNTER — Encounter: Payer: Self-pay | Admitting: Internal Medicine

## 2015-04-25 ENCOUNTER — Encounter: Payer: BC Managed Care – PPO | Admitting: Internal Medicine

## 2015-04-25 ENCOUNTER — Ambulatory Visit (INDEPENDENT_AMBULATORY_CARE_PROVIDER_SITE_OTHER): Payer: BC Managed Care – PPO | Admitting: Internal Medicine

## 2015-04-25 VITALS — BP 144/102 | HR 91 | Temp 97.8°F | Resp 18 | Ht 67.0 in | Wt 190.0 lb

## 2015-04-25 DIAGNOSIS — I10 Essential (primary) hypertension: Secondary | ICD-10-CM

## 2015-04-25 DIAGNOSIS — E785 Hyperlipidemia, unspecified: Secondary | ICD-10-CM

## 2015-04-25 DIAGNOSIS — Z0000 Encounter for general adult medical examination without abnormal findings: Secondary | ICD-10-CM | POA: Diagnosis not present
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
## 2015-04-25 NOTE — Assessment & Plan Note (Signed)
Was on a statin - ? Stopped for unknown reason Will check lipid panel  Given possible TIA history should be on a statin - will see what lipid panel is and discuss with patient Increase exercise Weight loss

## 2015-04-25 NOTE — Patient Instructions (Addendum)
We have reviewed your prior records including labs and tests today.  Test(s) ordered today. Your results will be released to MyChart (or called to you) after review, usually within 72hours after test completion. If any changes need to be made, you will be notified at that same time.  All other Health Maintenance issues reviewed.   All recommended immunizations and age-appropriate screenings are up-to-date.  No immunizations administered today.   Medications reviewed and updated.  No changes recommended at this time - just restart your baby aspirin daily.  Please followup annually for a physical, sooner if needed   Health Maintenance, Male A healthy lifestyle and preventative care can promote health and wellness.  Maintain regular health, dental, and eye exams.  Eat a healthy diet. Foods like vegetables, fruits, whole grains, low-fat dairy products, and lean protein foods contain the nutrients you need and are low in calories. Decrease your intake of foods high in solid fats, added sugars, and salt. Get information about a proper diet from your health care provider, if necessary.  Regular physical exercise is one of the most important things you can do for your health. Most adults should get at least 150 minutes of moderate-intensity exercise (any activity that increases your heart rate and causes you to sweat) each week. In addition, most adults need muscle-strengthening exercises on 2 or more days a week.   Maintain a healthy weight. The body mass index (BMI) is a screening tool to identify possible weight problems. It provides an estimate of body fat based on height and weight. Your health care provider can find your BMI and can help you achieve or maintain a healthy weight. For males 20 years and older:  A BMI below 18.5 is considered underweight.  A BMI of 18.5 to 24.9 is normal.  A BMI of 25 to 29.9 is considered overweight.  A BMI of 30 and above is considered obese.  Maintain  normal blood lipids and cholesterol by exercising and minimizing your intake of saturated fat. Eat a balanced diet with plenty of fruits and vegetables. Blood tests for lipids and cholesterol should begin at age 58 and be repeated every 5 years. If your lipid or cholesterol levels are high, you are over age 20, or you are at high risk for heart disease, you may need your cholesterol levels checked more frequently.Ongoing high lipid and cholesterol levels should be treated with medicines if diet and exercise are not working.  If you smoke, find out from your health care provider how to quit. If you do not use tobacco, do not start.  Lung cancer screening is recommended for adults aged 55-80 years who are at high risk for developing lung cancer because of a history of smoking. A yearly low-dose CT scan of the lungs is recommended for people who have at least a 30-pack-year history of smoking and are current smokers or have quit within the past 15 years. A pack year of smoking is smoking an average of 1 pack of cigarettes a day for 1 year (for example, a 30-pack-year history of smoking could mean smoking 1 pack a day for 30 years or 2 packs a day for 15 years). Yearly screening should continue until the smoker has stopped smoking for at least 15 years. Yearly screening should be stopped for people who develop a health problem that would prevent them from having lung cancer treatment.  If you choose to drink alcohol, do not have more than 2 drinks per day. One drink  is considered to be 12 oz (360 mL) of beer, 5 oz (150 mL) of wine, or 1.5 oz (45 mL) of liquor.  Avoid the use of street drugs. Do not share needles with anyone. Ask for help if you need support or instructions about stopping the use of drugs.  High blood pressure causes heart disease and increases the risk of stroke. High blood pressure is more likely to develop in:  People who have blood pressure in the end of the normal range (100-139/85-89  mm Hg).  People who are overweight or obese.  People who are African American.  If you are 56-30 years of age, have your blood pressure checked every 3-5 years. If you are 34 years of age or older, have your blood pressure checked every year. You should have your blood pressure measured twice--once when you are at a hospital or clinic, and once when you are not at a hospital or clinic. Record the average of the two measurements. To check your blood pressure when you are not at a hospital or clinic, you can use:  An automated blood pressure machine at a pharmacy.  A home blood pressure monitor.  If you are 59-23 years old, ask your health care provider if you should take aspirin to prevent heart disease.  Diabetes screening involves taking a blood sample to check your fasting blood sugar level. This should be done once every 3 years after age 31 if you are at a normal weight and without risk factors for diabetes. Testing should be considered at a younger age or be carried out more frequently if you are overweight and have at least 1 risk factor for diabetes.  Colorectal cancer can be detected and often prevented. Most routine colorectal cancer screening begins at the age of 10 and continues through age 65. However, your health care provider may recommend screening at an earlier age if you have risk factors for colon cancer. On a yearly basis, your health care provider may provide home test kits to check for hidden blood in the stool. A small camera at the end of a tube may be used to directly examine the colon (sigmoidoscopy or colonoscopy) to detect the earliest forms of colorectal cancer. Talk to your health care provider about this at age 58 when routine screening begins. A direct exam of the colon should be repeated every 5-10 years through age 63, unless early forms of precancerous polyps or small growths are found.  People who are at an increased risk for hepatitis B should be screened for  this virus. You are considered at high risk for hepatitis B if:  You were born in a country where hepatitis B occurs often. Talk with your health care provider about which countries are considered high risk.  Your parents were born in a high-risk country and you have not received a shot to protect against hepatitis B (hepatitis B vaccine).  You have HIV or AIDS.  You use needles to inject street drugs.  You live with, or have sex with, someone who has hepatitis B.  You are a man who has sex with other men (MSM).  You get hemodialysis treatment.  You take certain medicines for conditions like cancer, organ transplantation, and autoimmune conditions.  Hepatitis C blood testing is recommended for all people born from [birth year] through 1964 and any individual with known risk factors for hepatitis C.  Healthy men should no longer receive prostate-specific antigen (PSA) blood tests as part of routine cancer  screening. Talk to your health care provider about prostate cancer screening.  Testicular cancer screening is not recommended for adolescents or adult males who have no symptoms. Screening includes self-exam, a health care provider exam, and other screening tests. Consult with your health care provider about any symptoms you have or any concerns you have about testicular cancer.  Practice safe sex. Use condoms and avoid high-risk sexual practices to reduce the spread of sexually transmitted infections (STIs).  You should be screened for STIs, including gonorrhea and chlamydia if:  You are sexually active and are younger than 24 years.  You are older than 24 years, and your health care provider tells you that you are at risk for this type of infection.  Your sexual activity has changed since you were last screened, and you are at an increased risk for chlamydia or gonorrhea. Ask your health care provider if you are at risk.  If you are at risk of being infected with HIV, it is recommended  that you take a prescription medicine daily to prevent HIV infection. This is called pre-exposure prophylaxis (PrEP). You are considered at risk if:  You are a man who has sex with other men (MSM).  You are a heterosexual man who is sexually active with multiple partners.  You take drugs by injection.  You are sexually active with a partner who has HIV.  Talk with your health care provider about whether you are at high risk of being infected with HIV. If you choose to begin PrEP, you should first be tested for HIV. You should then be tested every 3 months for as long as you are taking PrEP.  Use sunscreen. Apply sunscreen liberally and repeatedly throughout the day. You should seek shade when your shadow is shorter than you. Protect yourself by wearing long sleeves, pants, a wide-brimmed hat, and sunglasses year round whenever you are outdoors.  Tell your health care provider of new moles or changes in moles, especially if there is a change in shape or color. Also, tell your health care provider if a mole is larger than the size of a pencil eraser.  A one-time screening for abdominal aortic aneurysm (AAA) and surgical repair of large AAAs by ultrasound is recommended for men aged 65-75 years who are current or former smokers.  Stay current with your vaccines (immunizations).   This information is not intended to replace advice given to you by your health care provider. Make sure you discuss any questions you have with your health care provider.   Document Released: 09/15/2007 Document Revised: 04/09/2014 Document Reviewed: 08/14/2010 Elsevier Interactive Patient Education [employer]

## 2015-04-25 NOTE — Progress Notes (Signed)
Pre visit review using our clinic review tool, if applicable. No additional management support is needed unless otherwise documented below in the visit note. 

## 2015-04-25 NOTE — Assessment & Plan Note (Signed)
BP elevated here today He will start monitoring at home regularly Work on weight loss and increasing exercise Continue losartan 25 mg daily now - will titrate if needed Check cmp

## 2015-04-26 ENCOUNTER — Other Ambulatory Visit (INDEPENDENT_AMBULATORY_CARE_PROVIDER_SITE_OTHER): Payer: BC Managed Care – PPO

## 2015-04-26 DIAGNOSIS — Z0000 Encounter for general adult medical examination without abnormal findings: Secondary | ICD-10-CM

## 2015-04-26 DIAGNOSIS — R7989 Other specified abnormal findings of blood chemistry: Secondary | ICD-10-CM | POA: Diagnosis not present

## 2015-04-26 LAB — CBC WITH DIFFERENTIAL/PLATELET
BASOS ABS: 0.1 10*3/uL (ref 0.0–0.1)
Basophils Relative: 0.7 % (ref 0.0–3.0)
EOS ABS: 0.5 10*3/uL (ref 0.0–0.7)
Eosinophils Relative: 3.1 % (ref 0.0–5.0)
HEMATOCRIT: 48.1 % (ref 39.0–52.0)
Hemoglobin: 15.9 g/dL (ref 13.0–17.0)
LYMPHS PCT: 35.9 % (ref 12.0–46.0)
Lymphs Abs: 5.3 10*3/uL — ABNORMAL HIGH (ref 0.7–4.0)
MCHC: 33.2 g/dL (ref 30.0–36.0)
MCV: 92.4 fl (ref 78.0–100.0)
Monocytes Absolute: 0.9 10*3/uL (ref 0.1–1.0)
Monocytes Relative: 5.9 % (ref 3.0–12.0)
NEUTROS ABS: 8 10*3/uL — AB (ref 1.4–7.7)
NEUTROS PCT: 54.4 % (ref 43.0–77.0)
PLATELETS: 340 10*3/uL (ref 150.0–400.0)
RBC: 5.2 Mil/uL (ref 4.22–5.81)
RDW: 14.4 % (ref 11.5–15.5)
WBC: 14.7 10*3/uL — ABNORMAL HIGH (ref 4.0–10.5)

## 2015-04-26 LAB — COMPREHENSIVE METABOLIC PANEL
ALT: 18 U/L (ref 0–53)
AST: 15 U/L (ref 0–37)
Albumin: 4.4 g/dL (ref 3.5–5.2)
Alkaline Phosphatase: 58 U/L (ref 39–117)
BILIRUBIN TOTAL: 0.5 mg/dL (ref 0.2–1.2)
BUN: 24 mg/dL — ABNORMAL HIGH (ref 6–23)
CALCIUM: 9.5 mg/dL (ref 8.4–10.5)
CO2: 19 meq/L (ref 19–32)
CREATININE: 0.74 mg/dL (ref 0.40–1.50)
Chloride: 105 mEq/L (ref 96–112)
GFR: 119.71 mL/min (ref >60.00)
GLUCOSE: 102 mg/dL — AB (ref 70–99)
Potassium: 4.2 mEq/L (ref 3.5–5.1)
Sodium: 138 mEq/L (ref 135–145)
TOTAL PROTEIN: 8 g/dL (ref 6.0–8.3)

## 2015-04-26 LAB — LIPID PANEL
CHOL/HDL RATIO: 5
Cholesterol: 217 mg/dL — ABNORMAL HIGH (ref 0–200)
HDL: 44.2 mg/dL (ref >39.00)
NONHDL: 172.86
Triglycerides: 213 mg/dL — ABNORMAL HIGH (ref 0.0–149.0)
VLDL: 42.6 mg/dL — ABNORMAL HIGH (ref 0.0–40.0)

## 2015-04-26 LAB — LDL CHOLESTEROL, DIRECT: LDL DIRECT: 156 mg/dL

## 2015-04-26 LAB — HEMOGLOBIN A1C: Hgb A1c MFr Bld: 5.5 % (ref 4.6–6.5)

## 2015-04-26 LAB — TSH: TSH: 2.2 u[IU]/mL (ref 0.35–4.50)

## 2015-05-01 ENCOUNTER — Encounter: Payer: Self-pay | Admitting: Internal Medicine

## 2015-05-06 ENCOUNTER — Other Ambulatory Visit: Payer: Self-pay | Admitting: Cardiology

## 2015-05-09 ENCOUNTER — Other Ambulatory Visit: Payer: Self-pay | Admitting: Cardiology

## 2015-05-09 NOTE — Telephone Encounter (Signed)
Rx(s) sent to pharmacy electronically.  

## 2015-09-11 ENCOUNTER — Encounter: Payer: Self-pay | Admitting: Internal Medicine

## 2015-09-11 DIAGNOSIS — E785 Hyperlipidemia, unspecified: Secondary | ICD-10-CM

## 2015-09-20 ENCOUNTER — Ambulatory Visit (INDEPENDENT_AMBULATORY_CARE_PROVIDER_SITE_OTHER)
Admission: RE | Admit: 2015-09-20 | Discharge: 2015-09-20 | Disposition: A | Discharge status: Home / Self Care | Payer: BC Managed Care – PPO | Source: Ambulatory Visit | Attending: Internal Medicine | Admitting: Internal Medicine

## 2015-09-20 ENCOUNTER — Other Ambulatory Visit: Payer: Self-pay | Admitting: Internal Medicine

## 2015-09-20 ENCOUNTER — Encounter: Payer: Self-pay | Admitting: Internal Medicine

## 2015-09-20 ENCOUNTER — Ambulatory Visit (INDEPENDENT_AMBULATORY_CARE_PROVIDER_SITE_OTHER): Payer: BC Managed Care – PPO | Admitting: Internal Medicine

## 2015-09-20 VITALS — BP 144/100 | HR 112 | Temp 97.6°F | Resp 18

## 2015-09-20 DIAGNOSIS — M25551 Pain in right hip: Secondary | ICD-10-CM | POA: Diagnosis not present

## 2015-09-20 DIAGNOSIS — I10 Essential (primary) hypertension: Secondary | ICD-10-CM | POA: Diagnosis not present

## 2015-09-20 MED ORDER — HYDROCHLOROTHIAZIDE 12.5 MG PO CAPS
12.5000 mg | ORAL_CAPSULE | Freq: Every day | ORAL | Status: DC
Start: 2015-09-20 — End: 2016-02-10
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
## 2015-09-20 NOTE — Patient Instructions (Addendum)
An xray of your hip was ordered, we will call you with the result.   Medications reviewed and updated.  Changes include discontinuing the losartan and start hydrochlorothiazide 12. 5mg  daily.  Monitor your BP at home. Your goal BP is < 140/90.   Your prescription(s) have been submitted to your pharmacy. Please take as directed and contact our office if you believe you are having problem(s) with the medication(s).

## 2015-09-20 NOTE — Assessment & Plan Note (Signed)
?   Cause - ? Related to congenital disease or OA -or both Check xray Can increase celebrex and tylenol if needed for pain control - no narcotics needed Will f/u with Dr Clovis Riley his orthopedic and if he is not able to get in touch with their office will let me know

## 2015-09-20 NOTE — Assessment & Plan Note (Addendum)
Elevated at home off medication Discussed options  Will try hctz 12.5 mg daily - may need a higher dose Will monitor BP at home given white coat htn Call or return if not controlled Will be doing blood work next week

## 2015-09-20 NOTE — Progress Notes (Signed)
Pre visit review using our clinic review tool, if applicable. No additional management support is needed unless otherwise documented below in the visit note. 

## 2015-10-13 ENCOUNTER — Encounter: Payer: Self-pay | Admitting: Internal Medicine

## 2015-10-13 ENCOUNTER — Ambulatory Visit (INDEPENDENT_AMBULATORY_CARE_PROVIDER_SITE_OTHER): Payer: BC Managed Care – PPO | Admitting: Internal Medicine

## 2015-10-13 VITALS — BP 133/96 | HR 94 | Ht 67.0 in | Wt 186.8 lb

## 2015-10-13 DIAGNOSIS — I4892 Unspecified atrial flutter: Secondary | ICD-10-CM | POA: Diagnosis not present

## 2015-10-13 DIAGNOSIS — I442 Atrioventricular block, complete: Secondary | ICD-10-CM

## 2015-10-13 DIAGNOSIS — Z950 Presence of cardiac pacemaker: Secondary | ICD-10-CM

## 2015-10-13 LAB — CUP PACEART INCLINIC DEVICE CHECK
Battery Remaining Longevity: 57 mo
Brady Statistic AP VP Percent: 15 %
Brady Statistic AP VS Percent: 0 %
Brady Statistic AS VP Percent: 85 %
Brady Statistic AS VS Percent: 0 %
Date Time Interrogation Session: 20170713090243
Implantable Lead Implant Date: 20130401
Implantable Lead Model: 5076
Lead Channel Impedance Value: 458 Ohm
Lead Channel Pacing Threshold Amplitude: 0.75 V
Lead Channel Pacing Threshold Amplitude: 1 V
Lead Channel Pacing Threshold Pulse Width: 1 ms
Lead Channel Setting Pacing Amplitude: 2.5 V
Lead Channel Setting Pacing Pulse Width: 1 ms
Lead Channel Setting Sensing Sensitivity: 2.8 mV
MDC IDC LEAD IMPLANT DT: 20130401
MDC IDC LEAD LOCATION: 753859
MDC IDC LEAD LOCATION: 753860
MDC IDC MSMT BATTERY IMPEDANCE: 632 Ohm
MDC IDC MSMT BATTERY VOLTAGE: 2.78 V
MDC IDC MSMT LEADCHNL RA IMPEDANCE VALUE: 450 Ohm
MDC IDC MSMT LEADCHNL RA PACING THRESHOLD AMPLITUDE: 0.875 V
MDC IDC MSMT LEADCHNL RA PACING THRESHOLD PULSEWIDTH: 0.4 ms
MDC IDC MSMT LEADCHNL RA PACING THRESHOLD PULSEWIDTH: 0.4 ms
MDC IDC MSMT LEADCHNL RA SENSING INTR AMPL: 4 mV
MDC IDC MSMT LEADCHNL RV PACING THRESHOLD AMPLITUDE: 0.5 V
MDC IDC MSMT LEADCHNL RV PACING THRESHOLD PULSEWIDTH: 0.4 ms
MDC IDC SET LEADCHNL RA PACING AMPLITUDE: 2 V
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
## 2015-10-13 NOTE — Patient Instructions (Signed)
Medication Instructions: - Your physician recommends that you continue on your current medications as directed. Please refer to the Current Medication list given to you today.  Labwork: - none  Procedures/Testing: - Your physician has requested that you have an echocardiogram. Echocardiography is a painless test that uses sound waves to create images of your heart. It provides your doctor with information about the size and shape of your heart and how well your heart's chambers and valves are working. This procedure takes approximately one hour. There are no restrictions for this procedure.  Follow-Up: - Your physician wants you to follow-up in: 6 months with the Device Clinic & 1 year with Dr. Graciela Husbands. You will receive a reminder letter in the mail two months in advance. If you don't receive a letter, please call our office to schedule the follow-up appointment.  Any Additional Special Instructions Will Be Listed Below (If Applicable).     If you need a refill on your cardiac medications before your next appointment, please call your pharmacy.

## 2015-10-19 ENCOUNTER — Other Ambulatory Visit (INDEPENDENT_AMBULATORY_CARE_PROVIDER_SITE_OTHER): Payer: BC Managed Care – PPO

## 2015-10-19 ENCOUNTER — Encounter: Payer: Self-pay | Admitting: Internal Medicine

## 2015-10-19 DIAGNOSIS — E785 Hyperlipidemia, unspecified: Secondary | ICD-10-CM

## 2015-10-19 LAB — LIPID PANEL
Cholesterol: 250 mg/dL — ABNORMAL HIGH (ref 0–200)
HDL: 41.9 mg/dL (ref >39.00)
NONHDL: 207.76
TRIGLYCERIDES: 353 mg/dL — AB (ref 0.0–149.0)
Total CHOL/HDL Ratio: 6
VLDL: 70.6 mg/dL — ABNORMAL HIGH (ref 0.0–40.0)

## 2015-10-19 LAB — LDL CHOLESTEROL, DIRECT: LDL DIRECT: 177 mg/dL

## 2015-10-22 ENCOUNTER — Encounter: Payer: Self-pay | Admitting: Internal Medicine

## 2015-10-25 ENCOUNTER — Telehealth: Payer: Self-pay | Admitting: *Deleted

## 2015-10-25 DIAGNOSIS — E785 Hyperlipidemia, unspecified: Secondary | ICD-10-CM

## 2015-10-25 MED ORDER — ATORVASTATIN CALCIUM 20 MG PO TABS: 20.0000 mg | ORAL_TABLET | Freq: Every day | ORAL | 3 refills | Status: DC

## 2015-10-25 NOTE — Telephone Encounter (Signed)
LVM informing pt

## 2015-10-25 NOTE — Telephone Encounter (Signed)
Pt called and stated MD was suppose to prescribing something for his cholesterol. Sent mychart msg but have not heard back. Pls advise...Raechel Chute

## 2015-10-25 NOTE — Telephone Encounter (Signed)
Generic lipitor sent to [facility].  Recheck lipids and liver in 2 months (ordered)

## 2015-10-27 ENCOUNTER — Ambulatory Visit (HOSPITAL_COMMUNITY): Payer: BC Managed Care – PPO | Attending: Internal Medicine

## 2015-10-27 DIAGNOSIS — I517 Cardiomegaly: Secondary | ICD-10-CM | POA: Diagnosis not present

## 2015-10-27 DIAGNOSIS — I4892 Unspecified atrial flutter: Secondary | ICD-10-CM | POA: Diagnosis present

## 2015-11-03 ENCOUNTER — Other Ambulatory Visit: Payer: Self-pay | Admitting: *Deleted

## 2015-11-03 ENCOUNTER — Telehealth: Payer: Self-pay | Admitting: *Deleted

## 2015-11-03 DIAGNOSIS — I4892 Unspecified atrial flutter: Secondary | ICD-10-CM

## 2015-11-03 NOTE — Telephone Encounter (Signed)
Order placed and the patient is aware that he will need additional imaging with contrast for his EF. He states it would be better for him to call back tomorrow and speak with Melissa.

## 2015-11-03 NOTE — Telephone Encounter (Signed)
-----   Message from Danton Clap sent at 11/02/2015 12:53 PM EDT ----- Can you put an order in for this?  Thanks, Melissa ----- Message ----- From: Duke Salvia, MD Sent: 11/02/2015  12:14 PM To: Jefferey Pica, RN, Danton Clap  Can u arrange followup/repeat echo with IV contrast-- tell pt the images with standard echo were concerning but not valid, and we will try this-- if doesn't work would need alternative imaging to be discussed

## 2015-11-16 ENCOUNTER — Encounter (HOSPITAL_COMMUNITY): Payer: Self-pay

## 2015-11-16 ENCOUNTER — Ambulatory Visit (HOSPITAL_COMMUNITY): Payer: BC Managed Care – PPO

## 2015-11-17 ENCOUNTER — Encounter (HOSPITAL_COMMUNITY): Payer: Self-pay | Admitting: Radiology

## 2015-11-17 NOTE — Progress Notes (Signed)
LMOM to reschedule patient for limited echo with contrast.

## 2015-12-08 ENCOUNTER — Ambulatory Visit (HOSPITAL_COMMUNITY): Payer: BC Managed Care – PPO | Attending: Cardiology

## 2015-12-08 ENCOUNTER — Other Ambulatory Visit: Payer: Self-pay

## 2015-12-08 DIAGNOSIS — I4892 Unspecified atrial flutter: Secondary | ICD-10-CM | POA: Insufficient documentation

## 2015-12-08 LAB — ECHOCARDIOGRAM LIMITED

## 2015-12-08 MED ORDER — PERFLUTREN LIPID MICROSPHERE
1.0000 mL | INTRAVENOUS | Status: AC | PRN
  Administered 2015-12-08: 2 mL via INTRAVENOUS

## 2015-12-14 ENCOUNTER — Telehealth: Payer: Self-pay | Admitting: *Deleted

## 2015-12-14 ENCOUNTER — Encounter: Payer: Self-pay | Admitting: Internal Medicine

## 2015-12-14 NOTE — Telephone Encounter (Signed)
LMOVM regarding MyChart message.  Gave device clinic phone number for additional questions or concerns.

## 2016-01-11 ENCOUNTER — Encounter: Payer: Self-pay | Admitting: Internal Medicine

## 2016-01-11 ENCOUNTER — Ambulatory Visit (INDEPENDENT_AMBULATORY_CARE_PROVIDER_SITE_OTHER): Payer: BC Managed Care – PPO | Admitting: Internal Medicine

## 2016-01-11 VITALS — BP 126/90 | HR 93 | Ht 66.0 in | Wt 177.4 lb

## 2016-01-11 DIAGNOSIS — Z01812 Encounter for preprocedural laboratory examination: Secondary | ICD-10-CM | POA: Diagnosis not present

## 2016-01-11 DIAGNOSIS — Z950 Presence of cardiac pacemaker: Secondary | ICD-10-CM | POA: Diagnosis not present

## 2016-01-11 DIAGNOSIS — I4892 Unspecified atrial flutter: Secondary | ICD-10-CM

## 2016-01-11 DIAGNOSIS — I442 Atrioventricular block, complete: Secondary | ICD-10-CM

## 2016-01-11 MED ORDER — LISINOPRIL 5 MG PO TABS: 5.0000 mg | ORAL_TABLET | Freq: Every day | ORAL | 6 refills | Status: DC
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
## 2016-01-11 NOTE — Patient Instructions (Addendum)
Medication Instructions: Your physician has recommended you make the following change in your medication:   1. START Lisinopril 5 mg - Take 1 tablet by mouth daily   Labwork: Pre Procedure Lab work 02/17/16 - CBC/ BMET/INR  Procedures/Testing: Your physician has recommended that you have a CRT upgrade for pacemaker. This device uses electrical pulses to prompt the heart to beat at a normal rate. Pacemakers are used to treat heart rhythms that are too slow. Wires (leads) are attached to the pacemaker that goes into the chambers of your heart. This is done in the hospital and usually requires an overnight stay. Please refer to the instruction sheet that you will receive in the mail for more information.  ** Day of procedure is Friday 02/24/16 at 9:30 am with Dr. Graciela Husbands **  Follow-Up: Your physician recommends that you schedule a follow-up appointment 14-16 days from 02/24/16 (post procedure) for a wound check with the device clinic.  Any Additional Special Instructions Will Be Listed Below (If Applicable).     If you need a refill on your cardiac medications before your next appointment, please call your pharmacy.

## 2016-01-12 LAB — CUP PACEART INCLINIC DEVICE CHECK
Brady Statistic AP VP Percent: 18 %
Brady Statistic AP VS Percent: 0 %
Brady Statistic AS VP Percent: 82 %
Date Time Interrogation Session: 20171011142855
Implantable Lead Implant Date: 20130401
Implantable Lead Location: 753859
Implantable Lead Model: 5076
Lead Channel Impedance Value: 499 Ohm
Lead Channel Pacing Threshold Amplitude: 0.75 V
Lead Channel Pacing Threshold Amplitude: 1 V
Lead Channel Pacing Threshold Pulse Width: 0.4 ms
Lead Channel Pacing Threshold Pulse Width: 1 ms
Lead Channel Sensing Intrinsic Amplitude: 4 mV
Lead Channel Setting Sensing Sensitivity: 2.8 mV
MDC IDC LEAD IMPLANT DT: 20130401
MDC IDC LEAD LOCATION: 753860
MDC IDC MSMT BATTERY IMPEDANCE: 734 Ohm
MDC IDC MSMT BATTERY REMAINING LONGEVITY: 54 mo
MDC IDC MSMT BATTERY VOLTAGE: 2.77 V
MDC IDC MSMT LEADCHNL RA IMPEDANCE VALUE: 463 Ohm
MDC IDC SET LEADCHNL RA PACING AMPLITUDE: 2 V
MDC IDC SET LEADCHNL RV PACING AMPLITUDE: 2.5 V
MDC IDC SET LEADCHNL RV PACING PULSEWIDTH: 1 ms
MDC IDC STAT BRADY AS VS PERCENT: 0 %

## 2016-01-27 ENCOUNTER — Encounter: Payer: Self-pay | Admitting: *Deleted

## 2016-02-13 ENCOUNTER — Encounter: Payer: Self-pay | Admitting: Internal Medicine

## 2016-02-13 DIAGNOSIS — E785 Hyperlipidemia, unspecified: Secondary | ICD-10-CM

## 2016-02-17 ENCOUNTER — Other Ambulatory Visit: Payer: BC Managed Care – PPO | Admitting: *Deleted

## 2016-02-17 DIAGNOSIS — Z01812 Encounter for preprocedural laboratory examination: Secondary | ICD-10-CM

## 2016-02-17 DIAGNOSIS — I4892 Unspecified atrial flutter: Secondary | ICD-10-CM

## 2016-02-17 DIAGNOSIS — I442 Atrioventricular block, complete: Secondary | ICD-10-CM

## 2016-02-17 LAB — BASIC METABOLIC PANEL
BUN: 18 mg/dL (ref 7–25)
CALCIUM: 9.8 mg/dL (ref 8.6–10.3)
CO2: 19 mmol/L — AB (ref 20–31)
Chloride: 106 mmol/L (ref 98–110)
Creat: 0.73 mg/dL (ref 0.60–1.35)
GLUCOSE: 110 mg/dL — AB (ref 65–99)
Potassium: 4.5 mmol/L (ref 3.5–5.3)
Sodium: 139 mmol/L (ref 135–146)

## 2016-02-17 LAB — CBC
HCT: 45.9 % (ref 38.5–50.0)
Hemoglobin: 15.6 g/dL (ref 13.2–17.1)
MCH: 31.1 pg (ref 27.0–33.0)
MCHC: 34 g/dL (ref 32.0–36.0)
MCV: 91.6 fL (ref 80.0–100.0)
MPV: 10.4 fL (ref 7.5–12.5)
Platelets: 402 10*3/uL — ABNORMAL HIGH (ref 140–400)
RBC: 5.01 MIL/uL (ref 4.20–5.80)
RDW: 13.8 % (ref 11.0–15.0)
WBC: 15 10*3/uL — AB (ref 3.8–10.8)

## 2016-02-18 LAB — PROTIME-INR
INR: 1.1
PROTHROMBIN TIME: 11.9 s — AB (ref 9.0–11.5)

## 2016-02-20 ENCOUNTER — Other Ambulatory Visit: Payer: Self-pay | Admitting: Internal Medicine

## 2016-02-22 ENCOUNTER — Other Ambulatory Visit: Payer: Self-pay | Admitting: Internal Medicine

## 2016-02-22 DIAGNOSIS — I420 Dilated cardiomyopathy: Secondary | ICD-10-CM

## 2016-02-24 ENCOUNTER — Encounter (HOSPITAL_COMMUNITY): Payer: Self-pay | Admitting: Internal Medicine

## 2016-02-24 ENCOUNTER — Encounter (HOSPITAL_COMMUNITY)
Admission: RE | Disposition: A | Discharge status: Home / Self Care | Payer: Self-pay | Source: Ambulatory Visit | Attending: Internal Medicine

## 2016-02-24 ENCOUNTER — Ambulatory Visit (HOSPITAL_COMMUNITY)
Admission: RE | Admit: 2016-02-24 | Discharge: 2016-02-25 | Disposition: A | Discharge status: Home / Self Care | Payer: BC Managed Care – PPO | Source: Ambulatory Visit | Attending: Internal Medicine | Admitting: Internal Medicine

## 2016-02-24 DIAGNOSIS — I442 Atrioventricular block, complete: Secondary | ICD-10-CM | POA: Diagnosis present

## 2016-02-24 DIAGNOSIS — I10 Essential (primary) hypertension: Secondary | ICD-10-CM | POA: Diagnosis not present

## 2016-02-24 DIAGNOSIS — I428 Other cardiomyopathies: Secondary | ICD-10-CM | POA: Insufficient documentation

## 2016-02-24 DIAGNOSIS — I420 Dilated cardiomyopathy: Secondary | ICD-10-CM

## 2016-02-24 DIAGNOSIS — Z950 Presence of cardiac pacemaker: Secondary | ICD-10-CM

## 2016-02-24 DIAGNOSIS — Z7982 Long term (current) use of aspirin: Secondary | ICD-10-CM | POA: Insufficient documentation

## 2016-02-24 DIAGNOSIS — G709 Myoneural disorder, unspecified: Secondary | ICD-10-CM | POA: Diagnosis not present

## 2016-02-24 DIAGNOSIS — I509 Heart failure, unspecified: Secondary | ICD-10-CM

## 2016-02-24 DIAGNOSIS — Z95811 Presence of heart assist device: Secondary | ICD-10-CM

## 2016-02-24 DIAGNOSIS — E785 Hyperlipidemia, unspecified: Secondary | ICD-10-CM | POA: Diagnosis not present

## 2016-02-24 DIAGNOSIS — I429 Cardiomyopathy, unspecified: Secondary | ICD-10-CM

## 2016-02-24 DIAGNOSIS — Z959 Presence of cardiac and vascular implant and graft, unspecified: Secondary | ICD-10-CM

## 2016-02-24 HISTORY — DX: Cardiomyopathy, unspecified: I42.9

## 2016-02-24 HISTORY — PX: EP IMPLANTABLE DEVICE: SHX172B

## 2016-02-24 HISTORY — DX: Presence of cardiac pacemaker: Z95.0

## 2016-02-24 LAB — SURGICAL PCR SCREEN
MRSA, PCR: NEGATIVE
STAPHYLOCOCCUS AUREUS: NEGATIVE

## 2016-02-24 SURGERY — BIV PACEMAKER REVISION

## 2016-02-24 MED ORDER — FENTANYL CITRATE (PF) 100 MCG/2ML IJ SOLN
INTRAMUSCULAR | Status: AC
Start: 2016-02-24 — End: 2016-02-24
  Filled 2016-02-24: qty 2

## 2016-02-24 MED ORDER — SODIUM CHLORIDE 0.9 % IR SOLN
Status: AC
  Filled 2016-02-24: qty 2

## 2016-02-24 MED ORDER — SODIUM CHLORIDE 0.9 % IV SOLN
INTRAVENOUS | Status: DC
  Administered 2016-02-24: 11:00:00 via INTRAVENOUS

## 2016-02-24 MED ORDER — CHLORHEXIDINE GLUCONATE 4 % EX LIQD
60.0000 mL | Freq: Once | CUTANEOUS | Status: DC
  Filled 2016-02-24: qty 60

## 2016-02-24 MED ORDER — SODIUM CHLORIDE 0.9 % IV SOLN: INTRAVENOUS | Status: AC

## 2016-02-24 MED ORDER — CELECOXIB 200 MG PO CAPS
200.0000 mg | ORAL_CAPSULE | Freq: Two times a day (BID) | ORAL | Status: DC | PRN
  Filled 2016-02-24: qty 1

## 2016-02-24 MED ORDER — MELATONIN 3 MG PO TABS
1.0000 | ORAL_TABLET | Freq: Every day | ORAL | Status: DC | PRN
  Administered 2016-02-24: 3 mg via ORAL
  Filled 2016-02-24 (×2): qty 1

## 2016-02-24 MED ORDER — MIDAZOLAM HCL 5 MG/5ML IJ SOLN
INTRAMUSCULAR | Status: DC | PRN
  Administered 2016-02-24: 1 mg via INTRAVENOUS
  Administered 2016-02-24: 2 mg via INTRAVENOUS
  Administered 2016-02-24 (×5): 1 mg via INTRAVENOUS
  Administered 2016-02-24: 2 mg via INTRAVENOUS
  Administered 2016-02-24 (×5): 1 mg via INTRAVENOUS

## 2016-02-24 MED ORDER — ATORVASTATIN CALCIUM 20 MG PO TABS
20.0000 mg | ORAL_TABLET | Freq: Every day | ORAL | Status: DC
  Administered 2016-02-24: 21:00:00 20 mg via ORAL
  Filled 2016-02-24: qty 1

## 2016-02-24 MED ORDER — MIDAZOLAM HCL 5 MG/5ML IJ SOLN
INTRAMUSCULAR | Status: AC
  Filled 2016-02-24: qty 5

## 2016-02-24 MED ORDER — LIDOCAINE HCL (PF) 1 % IJ SOLN
INTRAMUSCULAR | Status: DC | PRN
  Administered 2016-02-24: 50 mL

## 2016-02-24 MED ORDER — MUPIROCIN 2 % EX OINT
1.0000 "application " | TOPICAL_OINTMENT | Freq: Once | CUTANEOUS | Status: AC
  Administered 2016-02-24: 1 via TOPICAL

## 2016-02-24 MED ORDER — CEFAZOLIN SODIUM-DEXTROSE 2-4 GM/100ML-% IV SOLN
2.0000 g | INTRAVENOUS | Status: AC
  Administered 2016-02-24: 2 g via INTRAVENOUS

## 2016-02-24 MED ORDER — IOPAMIDOL (ISOVUE-370) INJECTION 76%
INTRAVENOUS | Status: AC
  Filled 2016-02-24: qty 50

## 2016-02-24 MED ORDER — SODIUM CHLORIDE 0.9 % IR SOLN
80.0000 mg | Status: AC
  Administered 2016-02-24: 80 mg

## 2016-02-24 MED ORDER — MUPIROCIN 2 % EX OINT
TOPICAL_OINTMENT | CUTANEOUS | Status: AC
  Administered 2016-02-24: 1 via TOPICAL
  Filled 2016-02-24: qty 22

## 2016-02-24 MED ORDER — HEPARIN (PORCINE) IN NACL 2-0.9 UNIT/ML-% IJ SOLN
INTRAMUSCULAR | Status: AC
  Filled 2016-02-24: qty 500

## 2016-02-24 MED ORDER — CEFAZOLIN SODIUM-DEXTROSE 2-4 GM/100ML-% IV SOLN
INTRAVENOUS | Status: AC
  Filled 2016-02-24: qty 100

## 2016-02-24 MED ORDER — ACETAMINOPHEN 325 MG PO TABS: 325.0000 mg | ORAL_TABLET | ORAL | Status: DC | PRN

## 2016-02-24 MED ORDER — FENTANYL CITRATE (PF) 100 MCG/2ML IJ SOLN
INTRAMUSCULAR | Status: AC
  Filled 2016-02-24: qty 2

## 2016-02-24 MED ORDER — CEFAZOLIN IN D5W 1 GM/50ML IV SOLN
1.0000 g | Freq: Four times a day (QID) | INTRAVENOUS | Status: AC
  Administered 2016-02-24 – 2016-02-25 (×3): 1 g via INTRAVENOUS
  Filled 2016-02-24 (×3): qty 50

## 2016-02-24 MED ORDER — FENTANYL CITRATE (PF) 100 MCG/2ML IJ SOLN
INTRAMUSCULAR | Status: DC | PRN
  Administered 2016-02-24 (×2): 25 ug via INTRAVENOUS
  Administered 2016-02-24: 12.5 ug via INTRAVENOUS
  Administered 2016-02-24: 25 ug via INTRAVENOUS
  Administered 2016-02-24: 50 ug via INTRAVENOUS
  Administered 2016-02-24 (×7): 25 ug via INTRAVENOUS
  Administered 2016-02-24: 12.5 ug via INTRAVENOUS

## 2016-02-24 MED ORDER — CARVEDILOL 3.125 MG PO TABS
3.1250 mg | ORAL_TABLET | Freq: Two times a day (BID) | ORAL | Status: DC
Start: 2016-02-24 — End: 2016-02-25
  Administered 2016-02-24: 21:00:00 3.125 mg via ORAL
  Filled 2016-02-24: qty 1

## 2016-02-24 MED ORDER — ACETAMINOPHEN 325 MG PO TABS: 650.0000 mg | ORAL_TABLET | Freq: Four times a day (QID) | ORAL | Status: DC | PRN

## 2016-02-24 MED ORDER — ASPIRIN EC 81 MG PO TBEC
81.0000 mg | DELAYED_RELEASE_TABLET | Freq: Every day | ORAL | Status: DC
  Administered 2016-02-24: 81 mg via ORAL
  Filled 2016-02-24: qty 1

## 2016-02-24 MED ORDER — IOPAMIDOL (ISOVUE-370) INJECTION 76%
INTRAVENOUS | Status: DC | PRN
  Administered 2016-02-24: 15 mL via INTRAVENOUS

## 2016-02-24 MED ORDER — HEPARIN (PORCINE) IN NACL 2-0.9 UNIT/ML-% IJ SOLN
INTRAMUSCULAR | Status: DC | PRN
  Administered 2016-02-24: 500 mL

## 2016-02-24 MED ORDER — LIDOCAINE HCL (PF) 1 % IJ SOLN
INTRAMUSCULAR | Status: AC
  Filled 2016-02-24: qty 60

## 2016-02-24 MED ORDER — ONDANSETRON HCL 4 MG/2ML IJ SOLN
4.0000 mg | Freq: Four times a day (QID) | INTRAMUSCULAR | Status: DC | PRN
  Administered 2016-02-24: 4 mg via INTRAVENOUS
  Filled 2016-02-24: qty 2

## 2016-02-24 MED ORDER — SODIUM CHLORIDE 0.9 % IV SOLN
INTRAVENOUS | Status: DC
Start: 2016-02-24 — End: 2016-02-24
  Administered 2016-02-24: 11:00:00 via INTRAVENOUS

## 2016-02-24 SURGICAL SUPPLY — 17 items
ADAPTER SEALING SSA-EW-09 (MISCELLANEOUS) ×4 IMPLANT
CABLE SURGICAL S-101-97-12 (CABLE) ×4 IMPLANT
CATH ATTAIN COM SURV 6250V-MPR (CATHETERS) ×2 IMPLANT
CATH ATTAIN SEL SURV 6248V-90 (CATHETERS) ×2 IMPLANT
CATH RIGHTSITE C315HIS02 (CATHETERS) ×2 IMPLANT
DEVICE CRTP PERCEPTA QUAD MRI (Pacemaker) ×2 IMPLANT
HEMOSTAT SURGICEL 2X4 FIBR (HEMOSTASIS) ×2 IMPLANT
KIT ESSENTIALS PG (KITS) ×4 IMPLANT
LEAD QUARTET 1458Q-86CM (Lead) ×2 IMPLANT
PAD DEFIB LIFELINK (PAD) ×2 IMPLANT
SET INTRODUCER MICROPUNCT 5F (INTRODUCER) ×2 IMPLANT
SHEATH CLASSIC 7F (SHEATH) ×2 IMPLANT
SHEATH CLASSIC 9.5F (SHEATH) ×2 IMPLANT
SLITTER 6232ADJ (MISCELLANEOUS) ×4 IMPLANT
TRAY PACEMAKER INSERTION (PACKS) ×2 IMPLANT
WIRE ACUITY WHISPER EDS 4648 (WIRE) ×8 IMPLANT
WIRE HI TORQ VERSACORE-J 145CM (WIRE) ×2 IMPLANT

## 2016-02-24 NOTE — Progress Notes (Signed)
IV team never arrived. Pearson Grippe, RN from cath lab in and able to obtain IV access in left arm. Attempting IV insertion in right arm at present.

## 2016-02-24 NOTE — Op Note (Signed)
NAMEBINYOMIN, William Hartman NO.:  [id number]  MEDICAL RECORD NO.:  [id number]  LOCATION:  CATH                         FACILITY:  [facility]  PHYSICIAN:  Deboraha Sprang, MD, FACCDATE OF BIRTH:  1965/08/13  DATE OF PROCEDURE:  02/24/2016 DATE OF DISCHARGE:                              OPERATIVE REPORT   PREOPERATIVE DIAGNOSES:  Pacemaker-induced cardiomyopathy.  Complete heart block, pacemaker dependent.  POSTOPERATIVE DIAGNOSES:  Pacemaker-induced cardiomyopathy.  Complete heart block, pacemaker dependent.  DESCRIPTION OF PROCEDURE:  Following obtaining informed consent, the patient was brought to Electrophysiology Laboratory and placed on the fluoroscopic table in a supine position.  After routine prep and drape, a contrast venogram demonstrated the patency, but the stenosis of the right subclavian vein.  Thereafter, after routine prep and drape, a small 1.5-2 cm incision was made and carried down to the prepectoral fascia, that allowed [place] to gain access to the extrathoracic right subclavian vein without difficulty without the aspiration of air.  A micropuncture kit was utilized because of the tendency of the first wire to go into a collateral.  We then were able to upsize and placed a Wholey wire down.  We then placed a 7-French sheath and through this, passed a Medtronic His catheter delivery system with a His catheter.  We mapped for about 20-25 minutes and we were unable to find a His signal despite being able to record A and V signals.  We then abandoned this and with relatively ease, cannulated the coronary sinus.  Venography demonstrated two veins; one lateral and one anterolateral.  We targeted the lateral first and unfortunately having targeted it, we were unable to deploy a wire or the Medtronic Attain II inner-catheter delivery system past the branching point of this vein. During the wire exchange, I ended up inadvertently dislodging that wire, so we went  to the anterolateral branch.  We were able to deploy a Medtronic [id number] lead, serial number [id number] to a junction between the mid third.  It was a straight lead, which meant that the proximal whole pool, which was probably in the body of the coronary sinus should have been secured.  In this location, the paced sensed L wave was 9.2 with a pace impedance of 692 with threshold of less than 0.2 V at 0.5 millisecond.  There was no diaphragmatic pacing at 10 V.  This lead was then secured to the prepectoral fascia following removal of the inner- catheter delivery system and the outer-catheter delivery system. Fluoroscopic imaging demonstrated secure position.  We then turned our attention to opening up the previously implanted device pocket.  This was accomplished.  The leads were freed up.  The leads were then attached to a Medtronic Percepta MRI SureScan device, serial number [id number].  Through the device, bipolar P-wave was 6 with a pace impedance of 399, a threshold of 0.5 at 0.4.  The R-wave was not measurable.  The impedance was 437 with threshold of 0.75 V at 1 millisecond and the LV impedance was 1064 with threshold of 1.25 V at 0.4 milliseconds in a 1-2 configuration.  The pocket was copiously irrigated with antibiotic containing saline solution.  Hemostasis was assured.  The leads and the pulse generator were placed in the pocket, secured to the prepectoral fascia.  Surgicel was placed along the superior and lateral aspect of the device pocket.  The wound was then closed in 3 layers in normal fashion and a Dermabond dressing was applied.  Needle counts, sponge counts, and instrument counts were correct at the end of the procedure according to the staff.  The patient tolerated the procedure without apparent complication.     Deboraha Sprang, MD, [facility]     SCK/MEDQ  D:  02/24/2016  T:  02/24/2016  Job:  [id number]
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
## 2016-02-24 NOTE — Progress Notes (Signed)
Dr. Graciela Husbands in to see pt. Aware that pt has no IV and that IV team consult placed.

## 2016-02-24 NOTE — Discharge Instructions (Signed)
° ° °  Supplemental Discharge Instructions for  °Pacemaker/Defibrillator Patients ° °Activity °No heavy lifting or vigorous activity with your left/right arm for 6 to 8 weeks.  Do not raise your left/right arm above your head for one week.  Gradually raise your affected arm as drawn below. ° °        °   02/28/16                  02/29/16                  03/01/16                 03/02/16 °__ ° °NO DRIVING for 1 week    ; you may begin driving on  03/02/16   . ° °WOUND CARE °- Keep the wound area clean and dry.  Do not get this area wet, no showers for 24 hours; you may shower on 02/25/16 evening  . °- The tape/steri-strips on your wound will fall off; do not pull them off.  No bandage is needed on the site.  DO  NOT apply any creams, oils, or ointments to the wound area. °- If you notice any drainage or discharge from the wound, any swelling or bruising at the site, or you develop a fever > 101? F after you are discharged home, call the office at once. ° °Special Instructions °- You are still able to use cellular telephones; use the ear opposite the side where you have your pacemaker/defibrillator.  Avoid carrying your cellular phone near your device. °- When traveling through airports, show security personnel your identification card to avoid being screened in the metal detectors.  Ask the security personnel to use the hand wand. °- Avoid arc welding equipment, MRI testing (magnetic resonance imaging), TENS units (transcutaneous nerve stimulators).  Call the office for questions about other devices. °- Avoid electrical appliances that are in poor condition or are not properly grounded. °- Microwave ovens are safe to be near or to operate. ° °Additional information for defibrillator patients should your device go off: °- If your device goes off ONCE and you feel fine afterward, notify the device clinic nurses. °- If your device goes off ONCE and you do not feel well afterward, call 911. °- If your device goes off  TWICE, call 911. °- If your device goes off THREE times in one day, call 911. ° °DO NOT DRIVE YOURSELF OR A FAMILY MEMBER °WITH A DEFIBRILLATOR TO THE HOSPITAL--CALL 911. ° °

## 2016-02-24 NOTE — Clinical Documentation Improvement (Signed)
Patient reported vomiting at 1830, stated he did not want an antiemetic and denied nausea. 1900 reported vomiting again and given zofran 4 mg IV for symptom. Reported no further nausea and feeling better at 1930. Able to eat dinner at 2030 and no further nausea or vomiting.

## 2016-02-24 NOTE — Progress Notes (Signed)
CSW spoke with patient's wife in waiting area per wife's request.  Wife expressed worry regarding patient's health and family financial situation. Wife states she has quit work to take care of patient and patient continues to work when able.  CSW provided number to financial counseling department to wife as contact for wife's questions. CSW provided emotional support. No further needs expressed.   Gerrie Nordmann, LCSW [phone]

## 2016-02-25 ENCOUNTER — Encounter (HOSPITAL_COMMUNITY): Payer: Self-pay | Admitting: Physician Assistant

## 2016-02-25 ENCOUNTER — Ambulatory Visit (HOSPITAL_COMMUNITY): Payer: BC Managed Care – PPO

## 2016-02-25 DIAGNOSIS — G709 Myoneural disorder, unspecified: Secondary | ICD-10-CM | POA: Diagnosis present

## 2016-02-25 DIAGNOSIS — E785 Hyperlipidemia, unspecified: Secondary | ICD-10-CM | POA: Diagnosis not present

## 2016-02-25 DIAGNOSIS — I442 Atrioventricular block, complete: Secondary | ICD-10-CM

## 2016-02-25 DIAGNOSIS — Z95811 Presence of heart assist device: Secondary | ICD-10-CM

## 2016-02-25 DIAGNOSIS — I428 Other cardiomyopathies: Secondary | ICD-10-CM | POA: Diagnosis not present

## 2016-02-25 DIAGNOSIS — Z950 Presence of cardiac pacemaker: Secondary | ICD-10-CM

## 2016-02-25 DIAGNOSIS — I429 Cardiomyopathy, unspecified: Secondary | ICD-10-CM

## 2016-02-25 DIAGNOSIS — I10 Essential (primary) hypertension: Secondary | ICD-10-CM | POA: Diagnosis not present

## 2016-02-25 MED ORDER — CARVEDILOL 3.125 MG PO TABS: 3.1250 mg | ORAL_TABLET | Freq: Two times a day (BID) | ORAL | 6 refills | Status: DC

## 2016-02-25 MED ORDER — YOU HAVE A PACEMAKER BOOK
Freq: Once | Status: DC
  Filled 2016-02-25: qty 1

## 2016-02-25 NOTE — Discharge Summary (Signed)
Discharge Summary    Patient ID: William Hartman,  MRN: [id number], DOB/AGE: 1965/09/12 50 y.o.  Admit date: 02/24/2016 Discharge date: 02/25/2016  Primary Care Provider: Pincus Sanes Primary Cardiologist: Dr. Jens Som / Dr. Graciela Husbands (EP)   Discharge Diagnoses    Principal Problem:   Cardiomyopathy [facility]) Active Problems:   Hyperlipidemia   Essential hypertension   Complete heart block (HCC)   Neuromuscular disease or syndrome (HCC)   Presence of cardiac resynchronization therapy pacemaker (CRT-P)   Allergies Allergies  Allergen Reactions  . Lisinopril Cough  . Losartan     Off balance     History of Present Illness     William Hartman is a 50 y.o. male with a history of CHB s/p PPM, orthostatic hypotension, chronic neuromuscular disorder and recently diagnosed presumed pacemaker induced CM who presented to [facility] on 02/24/16 for planned CRT-P placement.  The patient presented in March of 2013 to the emergency room with recurrent syncope. He was found to have intermittent complete heart block and had a pacemaker placed. An echocardiogram in April of 2013 showed an ejection fraction of 60-65% and grade 1 diastolic dysfunction. Carotid Dopplers in April of 2013 were negative. Patient had recurrent dizzy spells and pacemaker was reprogrammed using drop algorhythm. Also started on mestinon. Saw Dr Graciela Husbands 5/15 and mestinon weaned.  Due 100% ventricular pacing, Dr. Graciela Husbands ordered an echocardiogram 7/17 repeated with contrast 9/17 which showed his EF had dropped to 35%. This was felt to be a pacemaker induced cardiomyopathy. He was started on an ACE and set up for CRT-P placement on 02/24/16.    Hospital Course     Consultants: none  Pacemaker induced cardiomyopathy: felt to be 2/2 100% RV pacing. He underwent successful explantation of old Medtronic device and CRT-P upgrade with a Medtronic Percepta MRI SureScan device, serial # [id number] and left ventricular lead placement on  02/24/16 by Dr. Graciela Husbands.  -- He had previously been started on Lisinopril 5mg  daily but had a cough, so this was discontinued when admitted and he was started on Coreg 3.125mg  BID. (Has a listed allergy to Losartan as well).  -- CXR this AM was stable.    The patient has had an uncomplicated hospital course and is recovering well. He has been seen by Dr. Elberta Fortis today and deemed ready for discharge home. All follow-up appointments have been scheduled. Discharge medications are listed below.  _____________  Discharge Vitals Blood pressure 102/67, pulse 63, temperature 98.2 F (36.8 C), temperature source Oral, resp. rate 15, height 5\' 7"  (1.702 m), weight 188 lb 7.9 oz (85.5 kg), SpO2 94 %.  Filed Weights   02/24/16 0811 02/25/16 0426  Weight: 177 lb (80.3 kg) 188 lb 7.9 oz (85.5 kg)    Labs & Radiologic Studies      Dg Chest 2 View  Result Date: 02/25/2016 CLINICAL DATA:  50 year old male with shortness of breath. Cardiac pacemaker revision. Initial encounter. EXAM: CHEST  2 VIEW COMPARISON:  12/30/2013 and earlier. FINDINGS: Revised right chest cardiac pacemaker device, now with 3 transvenous leads terminating at the RA, RV, and coronary sinus regions. No pneumothorax. Mediastinal contours remain normal. Visualized tracheal air column is within normal limits. The lungs are clear. No pulmonary edema or pleural effusion. No acute osseous abnormality identified. IMPRESSION: Right chest cardiac pacemaker revision with no adverse features. Electronically Signed   By: Odessa Fleming M.D.   On: 02/25/2016 07:54     Diagnostic Studies/Procedures  Procedures 02/24/16 BiV Pacemaker Upgrade  Procedural Details/Technique   Technical Details Chauncey Mannaul D Morino [id number] @CSN @  Preop Dx: cardiomyopathy complete heart block with 100 V pacing Postop Dx same/   Procedure: contrast venogram R sided CRT lead placement Generator replacement   Cx: None     Dictation number [id number]    Sherryl MangesSteven  Klein, MD 02/24/2016 2:06 PM       _____________    Disposition   Pt is being discharged home today in good condition.  Follow-up Plans & Appointments    Follow-up Information    [facility] [address] [facility] up on 03/12/2016.   Specialty:  Cardiology Why:  2;30PM, wound check Contact information: [address], [address] [place] [id number] [phone]       Sheilah Pigeonenee Lynn Ursuy, [place]-C. Go on 03/28/2016.   Specialty:  Cardiology Why:  @ 11am Contact information: [address] [place] [address] [phone]            Discharge Medications     Medication List    TAKE these medications   acetaminophen 325 MG tablet Commonly known as:  TYLENOL Take 650 mg by mouth every 6 (six) hours as needed for mild pain or headache.   aspirin 81 MG EC tablet Take 1 tablet (81 mg total) by mouth daily. What changed:  when to take this  reasons to take this   atorvastatin 20 MG tablet Commonly known as:  LIPITOR Take 1 tablet (20 mg total) by mouth daily.   carvedilol 3.125 MG tablet Commonly known as:  COREG Take 1 tablet (3.125 mg total) by mouth 2 (two) times daily with a meal.   celecoxib 200 MG capsule Commonly known as:  CELEBREX Take 1 capsule by mouth 2 (two) times daily as needed for mild pain (for pain).   Melatonin 3 MG Caps Take 1 capsule by mouth daily as needed (for sleep).         Outstanding Labs/Studies   none  Duration of Discharge Encounter   Greater than 30 minutes including physician time.  Signed, Cline CrockKathryn Thompson PA-C 02/25/2016, 9:13 AM   I have seen and examined this patient with Cline CrockKathryn Thompson.  Agree with above, note added to reflect my findings.  On exam, regular rhythm, no murmurs, lungs clear. Had upgrade to CRT-P yesterday.  CXR and interrogation without issues.  Plan for discharge today with follow up in device clinic in 10 days.    Tilia Faso M. Tesia Lybrand MD 02/25/2016 9:23  AM

## 2016-03-12 ENCOUNTER — Ambulatory Visit (INDEPENDENT_AMBULATORY_CARE_PROVIDER_SITE_OTHER): Payer: BC Managed Care – PPO | Admitting: *Deleted

## 2016-03-12 DIAGNOSIS — Z950 Presence of cardiac pacemaker: Secondary | ICD-10-CM

## 2016-03-12 DIAGNOSIS — I442 Atrioventricular block, complete: Secondary | ICD-10-CM

## 2016-03-12 LAB — CUP PACEART INCLINIC DEVICE CHECK
Battery Remaining Longevity: 77 mo
Battery Voltage: 3.2 V
Brady Statistic AP VS Percent: 0.01 %
Brady Statistic AS VP Percent: 72.73 %
Brady Statistic RA Percent Paced: 27.22 %
Date Time Interrogation Session: 20171211183332
Implantable Lead Implant Date: 20130401
Implantable Lead Implant Date: 20171124
Implantable Lead Location: 753858
Implantable Lead Location: 753860
Implantable Lead Model: 5076
Implantable Pulse Generator Implant Date: 20171124
Lead Channel Impedance Value: 380 Ohm
Lead Channel Impedance Value: 494 Ohm
Lead Channel Impedance Value: 627 Ohm
Lead Channel Impedance Value: 646 Ohm
Lead Channel Impedance Value: 741 Ohm
Lead Channel Impedance Value: 760 Ohm
Lead Channel Pacing Threshold Amplitude: 0.75 V
Lead Channel Pacing Threshold Amplitude: 0.75 V
Lead Channel Pacing Threshold Amplitude: 1.75 V
Lead Channel Pacing Threshold Pulse Width: 1 ms
Lead Channel Setting Pacing Amplitude: 2.5 V
Lead Channel Setting Pacing Pulse Width: 0.6 ms
Lead Channel Setting Pacing Pulse Width: 1 ms
MDC IDC LEAD IMPLANT DT: 20130401
MDC IDC LEAD LOCATION: 753859
MDC IDC MSMT LEADCHNL LV IMPEDANCE VALUE: 494 Ohm
MDC IDC MSMT LEADCHNL LV IMPEDANCE VALUE: 703 Ohm
MDC IDC MSMT LEADCHNL RA IMPEDANCE VALUE: 285 Ohm
MDC IDC MSMT LEADCHNL RA PACING THRESHOLD PULSEWIDTH: 0.4 ms
MDC IDC MSMT LEADCHNL RA SENSING INTR AMPL: 7.375 mV
MDC IDC MSMT LEADCHNL RV IMPEDANCE VALUE: 456 Ohm
MDC IDC MSMT LEADCHNL RV PACING THRESHOLD PULSEWIDTH: 0.6 ms
MDC IDC SET LEADCHNL LV PACING AMPLITUDE: 3.5 V
MDC IDC SET LEADCHNL RA PACING AMPLITUDE: 1.5 V
MDC IDC SET LEADCHNL RV SENSING SENSITIVITY: 4 mV
MDC IDC STAT BRADY AP VP PERCENT: 27.25 %
MDC IDC STAT BRADY AS VS PERCENT: 0.01 %
MDC IDC STAT BRADY RV PERCENT PACED: 99.97 %

## 2016-03-12 MED ORDER — CEPHALEXIN 500 MG PO CAPS: 500.0000 mg | ORAL_CAPSULE | Freq: Three times a day (TID) | ORAL | 0 refills | Status: AC

## 2016-03-12 NOTE — Patient Instructions (Addendum)
Call the Device Clinic at [phone] if you develop worsening signs/symptoms of infection, including drainage, redness, fever, or chills.  Take the Keflex (cephalexin) three (3) times daily with meals.  Make sure you finish all of the doses.  You may want to take this medication with food.

## 2016-03-12 NOTE — Progress Notes (Addendum)
Wound check appointment. Dermabond removed. Incision edges unapproximated at three points along incision, stitch removed from R lateral area, but no stitch noted at other sites. Pt and wife report small amount of opaque yellowish drainage that has tapered since implant. Pt denies fever or chills. Mild erythema surrounding unapproximated areas, incision otherwise approximated and healed. Site mildly edematous, soft to palpation. Dr. Johney Frame saw pt, recommended 7 day course of Keflex 500mg  TID and f/u with Dr. Graciela Husbands on 12/15. Applied Steri-strips to loosely approximate open areas per Dr. Johney Frame. Pt and wife agree to call if he develops fever/chills, or if drainage worsens.  Normal device function. RA and RV thresholds, sensing, and impedances consistent with chronic measurements, outputs at chronic settings. LV threshold increased to 2.75V @ 0.1ms, 1.75V @ 1.74ms. Reprogrammed LV pulse width to 1.33ms for extra safety margin until 3 month visit. Histogram distribution appropriate for patient and level of activity. No mode switches or high ventricular rates noted. Patient educated about wound care, arm mobility, lifting restrictions. ROV with Device Clinic on 03/16/16 for wound recheck with Dr. Graciela Husbands, ROV with Francis Dowse, [place], on 03/28/16, and ROV with Dr. Graciela Husbands on 05/28/16    Addendum: Pt with some drainage from device pocket.  Appears to be localized to incision line currently.  Will place on keflex and arrange close follow-up with Dr Graciela Husbands.  Hillis Range MD, [facility] 03/12/2016

## 2016-03-16 ENCOUNTER — Ambulatory Visit (INDEPENDENT_AMBULATORY_CARE_PROVIDER_SITE_OTHER): Payer: BC Managed Care – PPO | Admitting: *Deleted

## 2016-03-16 DIAGNOSIS — Z950 Presence of cardiac pacemaker: Secondary | ICD-10-CM

## 2016-03-16 NOTE — Patient Instructions (Addendum)
Continue to monitor site for redness, drainage, increasing swelling, or if you notice fever or chills.  Return to the Device Clinic on 03/27/16 at 9:30am.

## 2016-03-22 ENCOUNTER — Encounter: Payer: Self-pay | Admitting: Internal Medicine

## 2016-03-23 NOTE — Progress Notes (Signed)
Late entry from 03/16/16:  Patient seen in Device Clinic for wound recheck.  Steri-strips removed, incision pinholes remain at medial and R lateral incisions.  No drainage, fever, chills, or other signs/symptoms of infection noted.  Patient is aware to continue taking Keflex until he finishes all of the antibiotic.  Dr. Graciela Husbands saw patient, recommended keeping pinholes covered with antibiotic ointment and bandaid (aside from bathing/showers) until closed.  Patient to return on 03/27/16 at 9:30am for wound recheck.  He and his wife verbalizes understanding of instructions.  They are aware to call in the interim if he develops any signs/symptoms of infection.

## 2016-03-27 ENCOUNTER — Ambulatory Visit (INDEPENDENT_AMBULATORY_CARE_PROVIDER_SITE_OTHER): Payer: BC Managed Care – PPO | Admitting: *Deleted

## 2016-03-27 DIAGNOSIS — Z950 Presence of cardiac pacemaker: Secondary | ICD-10-CM

## 2016-03-27 NOTE — Progress Notes (Signed)
Wound recheck in clinic.  Incision edges now fully approximated, wound well healed.  Patient denies further drainage, fever, chills, or other signs/symptoms of infection.  EKG performed, reviewed by Dr. Graciela Husbands, no CRT-P reprogramming recommended.  ROV with Dr. Graciela Husbands on 05/28/16 at 9:30am for three month follow-up.  Patient and wife are aware to call if he develops any signs/symptoms of infection in interim.

## 2016-03-28 ENCOUNTER — Encounter: Payer: BC Managed Care – PPO | Admitting: Physician Assistant
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
## 2016-04-02 NOTE — Patient Instructions (Addendum)
   Medications reviewed and updated.  No changes recommended at this time.   Please followup in about 4 months

## 2016-04-03 ENCOUNTER — Encounter: Payer: Self-pay | Admitting: Internal Medicine

## 2016-04-03 ENCOUNTER — Ambulatory Visit (INDEPENDENT_AMBULATORY_CARE_PROVIDER_SITE_OTHER): Payer: BC Managed Care – PPO | Admitting: Internal Medicine

## 2016-04-03 VITALS — BP 154/104 | HR 65 | Temp 98.1°F | Resp 18 | Wt 187.0 lb

## 2016-04-03 DIAGNOSIS — I10 Essential (primary) hypertension: Secondary | ICD-10-CM | POA: Diagnosis not present

## 2016-04-03 DIAGNOSIS — Q743 Arthrogryposis multiplex congenita: Secondary | ICD-10-CM | POA: Diagnosis not present

## 2016-04-03 NOTE — Assessment & Plan Note (Signed)
Has some white coat htn - controlled at home Continue current medication Monitor at home

## 2016-04-03 NOTE — Assessment & Plan Note (Signed)
It is medically necessary for him to have a power wheelchair at work due to this disability - will provide a letter of medical necessity

## 2016-04-03 NOTE — Progress Notes (Signed)
Pre visit review using our clinic review tool, if applicable. No additional management support is needed unless otherwise documented below in the visit note. 

## 2016-04-04 NOTE — Telephone Encounter (Signed)
See mychart message - this if for the letter

## 2016-05-18 ENCOUNTER — Encounter: Payer: Self-pay | Admitting: Internal Medicine

## 2016-05-25 ENCOUNTER — Encounter: Payer: Self-pay | Admitting: *Deleted

## 2016-05-28 ENCOUNTER — Encounter: Payer: Self-pay | Admitting: Internal Medicine

## 2016-05-28 ENCOUNTER — Ambulatory Visit (INDEPENDENT_AMBULATORY_CARE_PROVIDER_SITE_OTHER): Payer: BC Managed Care – PPO | Admitting: Internal Medicine

## 2016-05-28 VITALS — BP 149/101 | HR 79 | Ht 66.0 in | Wt 188.2 lb

## 2016-05-28 DIAGNOSIS — Z950 Presence of cardiac pacemaker: Secondary | ICD-10-CM

## 2016-05-28 DIAGNOSIS — I442 Atrioventricular block, complete: Secondary | ICD-10-CM | POA: Diagnosis not present

## 2016-05-28 LAB — CUP PACEART INCLINIC DEVICE CHECK
Brady Statistic AP VS Percent: 0.01 %
Brady Statistic RA Percent Paced: 32.64 %
Date Time Interrogation Session: 20180226141219
Implantable Lead Implant Date: 20130401
Implantable Pulse Generator Implant Date: 20171124
Lead Channel Impedance Value: 323 Ohm
Lead Channel Impedance Value: 399 Ohm
Lead Channel Impedance Value: 779 Ohm
Lead Channel Impedance Value: 893 Ohm
Lead Channel Pacing Threshold Amplitude: 0.875 V
Lead Channel Pacing Threshold Pulse Width: 0.4 ms
Lead Channel Pacing Threshold Pulse Width: 1 ms
Lead Channel Setting Pacing Amplitude: 1.75 V
Lead Channel Setting Pacing Amplitude: 2.5 V
Lead Channel Setting Pacing Pulse Width: 1 ms
Lead Channel Setting Sensing Sensitivity: 4 mV
MDC IDC LEAD IMPLANT DT: 20130401
MDC IDC LEAD IMPLANT DT: 20171124
MDC IDC LEAD LOCATION: 753858
MDC IDC LEAD LOCATION: 753859
MDC IDC LEAD LOCATION: 753860
MDC IDC MSMT BATTERY REMAINING LONGEVITY: 94 mo
MDC IDC MSMT BATTERY VOLTAGE: 3.1 V
MDC IDC MSMT LEADCHNL LV IMPEDANCE VALUE: 513 Ohm
MDC IDC MSMT LEADCHNL LV IMPEDANCE VALUE: 760 Ohm
MDC IDC MSMT LEADCHNL LV IMPEDANCE VALUE: 874 Ohm
MDC IDC MSMT LEADCHNL LV IMPEDANCE VALUE: 874 Ohm
MDC IDC MSMT LEADCHNL LV PACING THRESHOLD AMPLITUDE: 1.375 V
MDC IDC MSMT LEADCHNL RA IMPEDANCE VALUE: 532 Ohm
MDC IDC MSMT LEADCHNL RA SENSING INTR AMPL: 6.625 mV
MDC IDC MSMT LEADCHNL RA SENSING INTR AMPL: 7.125 mV
MDC IDC MSMT LEADCHNL RV IMPEDANCE VALUE: 456 Ohm
MDC IDC SET LEADCHNL LV PACING AMPLITUDE: 2.5 V
MDC IDC SET LEADCHNL RV PACING PULSEWIDTH: 0.6 ms
MDC IDC STAT BRADY AP VP PERCENT: 32.67 %
MDC IDC STAT BRADY AS VP PERCENT: 67.31 %
MDC IDC STAT BRADY AS VS PERCENT: 0.01 %
MDC IDC STAT BRADY RV PERCENT PACED: 99.98 %

## 2016-05-28 NOTE — Patient Instructions (Signed)
Medication Instructions: - Your physician recommends that you continue on your current medications as directed. Please refer to the Current Medication list given to you today.   Labwork: - None Ordered  Procedures/Testing: - None Ordered  Follow-Up: -Your physician wants you to follow-up in 9 MONTHS with Dr. Graciela Husbands. You will receive a reminder letter in the mail two months in advance. If you don't receive a letter, please call our office to schedule the follow-up appointment. -Remote monitoring is used to monitor your Pacemaker from home. This monitoring reduces the number of office visits required to check your device to one time per year. It allows [place] to keep an eye on the functioning of your device to ensure it is working properly. You are scheduled for a device check from home on 08/28/16 You may send your transmission at any time that day. If you have a wireless device, the transmission will be sent automatically. After your physician reviews your transmission, you will receive a postcard with your next transmission date.    Any Additional Special Instructions Will Be Listed Below (If Applicable).     If you need a refill on your cardiac medications before your next appointment, please call your pharmacy.
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
## 2016-05-29 ENCOUNTER — Telehealth: Payer: Self-pay | Admitting: *Deleted

## 2016-05-29 NOTE — Telephone Encounter (Signed)
I explained that there will be no need for August appointment with [facility] if he participates in remote monitoring.  Monitor instructions were mailed out 05/25/16 and I ordered him a [id number] monitor from [employer] today. He is scheduled for remote transmission 08/28/16. He verbalizes understanding of instructions.

## 2016-05-29 NOTE — Telephone Encounter (Signed)
LMOM to call back to clarify f/u after SK appt yesterday.  Remote 08/28/16- doesn't need [facility] appt in August.

## 2016-05-29 NOTE — Telephone Encounter (Signed)
Pt is returning your call
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
## 2016-06-07 ENCOUNTER — Ambulatory Visit (INDEPENDENT_AMBULATORY_CARE_PROVIDER_SITE_OTHER): Payer: BC Managed Care – PPO | Admitting: Internal Medicine

## 2016-06-07 ENCOUNTER — Encounter: Payer: Self-pay | Admitting: Internal Medicine

## 2016-06-07 VITALS — BP 142/90 | HR 93 | Temp 98.3°F | Resp 16 | Wt 189.0 lb

## 2016-06-07 DIAGNOSIS — Q743 Arthrogryposis multiplex congenita: Secondary | ICD-10-CM | POA: Diagnosis not present

## 2016-06-07 NOTE — Patient Instructions (Signed)
Let me know which company you want to go with for your power wheelchair.

## 2016-06-07 NOTE — Progress Notes (Signed)
Pre visit review using our clinic review tool, if applicable. No additional management support is needed unless otherwise documented below in the visit note. 

## 2016-06-21 ENCOUNTER — Telehealth: Payer: Self-pay | Admitting: Emergency Medicine

## 2016-06-21 NOTE — Telephone Encounter (Signed)
OV notes and demographics have been faxed to [facility] at their request.

## 2016-07-12 ENCOUNTER — Telehealth: Payer: Self-pay | Admitting: Internal Medicine

## 2016-07-12 NOTE — Telephone Encounter (Signed)
[facility]  [phone]  She has some questions about the face to face office notes

## 2016-07-17 NOTE — Telephone Encounter (Signed)
LVM for Debbie to return call

## 2016-08-01 ENCOUNTER — Encounter: Payer: Self-pay | Admitting: Internal Medicine

## 2016-08-28 ENCOUNTER — Ambulatory Visit (INDEPENDENT_AMBULATORY_CARE_PROVIDER_SITE_OTHER): Payer: BC Managed Care – PPO | Admitting: *Deleted

## 2016-08-28 ENCOUNTER — Telehealth: Payer: Self-pay | Admitting: Cardiology

## 2016-08-28 DIAGNOSIS — I442 Atrioventricular block, complete: Secondary | ICD-10-CM | POA: Diagnosis not present

## 2016-08-28 DIAGNOSIS — I420 Dilated cardiomyopathy: Secondary | ICD-10-CM | POA: Diagnosis not present

## 2016-08-28 DIAGNOSIS — Z950 Presence of cardiac pacemaker: Secondary | ICD-10-CM | POA: Diagnosis not present

## 2016-08-28 NOTE — Telephone Encounter (Signed)
LMOVM reminding pt to send remote transmission.   

## 2016-08-29 LAB — CUP PACEART REMOTE DEVICE CHECK
Battery Remaining Longevity: 91 mo
Brady Statistic AP VP Percent: 19.51 %
Brady Statistic AP VS Percent: 0.01 %
Brady Statistic AS VP Percent: 80.48 %
Brady Statistic AS VS Percent: 0 %
Date Time Interrogation Session: 20180529234709
Implantable Lead Implant Date: 20130401
Implantable Lead Implant Date: 20171124
Implantable Lead Location: 753860
Implantable Lead Model: 5076
Lead Channel Impedance Value: 456 Ohm
Lead Channel Impedance Value: 494 Ohm
Lead Channel Impedance Value: 532 Ohm
Lead Channel Impedance Value: 551 Ohm
Lead Channel Impedance Value: 817 Ohm
Lead Channel Impedance Value: 836 Ohm
Lead Channel Pacing Threshold Amplitude: 1.125 V
Lead Channel Sensing Intrinsic Amplitude: 7 mV
Lead Channel Sensing Intrinsic Amplitude: 7 mV
Lead Channel Sensing Intrinsic Amplitude: 9.25 mV
Lead Channel Sensing Intrinsic Amplitude: 9.25 mV
Lead Channel Setting Pacing Amplitude: 2 V
Lead Channel Setting Pacing Amplitude: 2.5 V
Lead Channel Setting Pacing Amplitude: 2.5 V
Lead Channel Setting Pacing Pulse Width: 0.6 ms
Lead Channel Setting Pacing Pulse Width: 1 ms
MDC IDC LEAD IMPLANT DT: 20130401
MDC IDC LEAD LOCATION: 753858
MDC IDC LEAD LOCATION: 753859
MDC IDC MSMT BATTERY VOLTAGE: 3.03 V
MDC IDC MSMT LEADCHNL LV IMPEDANCE VALUE: 475 Ohm
MDC IDC MSMT LEADCHNL LV IMPEDANCE VALUE: 494 Ohm
MDC IDC MSMT LEADCHNL LV IMPEDANCE VALUE: 741 Ohm
MDC IDC MSMT LEADCHNL LV IMPEDANCE VALUE: 779 Ohm
MDC IDC MSMT LEADCHNL LV IMPEDANCE VALUE: 836 Ohm
MDC IDC MSMT LEADCHNL LV PACING THRESHOLD PULSEWIDTH: 1 ms
MDC IDC MSMT LEADCHNL RA IMPEDANCE VALUE: 342 Ohm
MDC IDC MSMT LEADCHNL RA PACING THRESHOLD AMPLITUDE: 1 V
MDC IDC MSMT LEADCHNL RA PACING THRESHOLD PULSEWIDTH: 0.4 ms
MDC IDC MSMT LEADCHNL RV IMPEDANCE VALUE: 418 Ohm
MDC IDC MSMT LEADCHNL RV IMPEDANCE VALUE: 475 Ohm
MDC IDC PG IMPLANT DT: 20171124
MDC IDC SET LEADCHNL RV SENSING SENSITIVITY: 4 mV
MDC IDC STAT BRADY RA PERCENT PACED: 19.46 %
MDC IDC STAT BRADY RV PERCENT PACED: 99.98 %

## 2016-09-04 ENCOUNTER — Encounter: Payer: Self-pay | Admitting: Cardiology

## 2016-11-05 ENCOUNTER — Ambulatory Visit (INDEPENDENT_AMBULATORY_CARE_PROVIDER_SITE_OTHER): Payer: BC Managed Care – PPO | Admitting: Internal Medicine

## 2016-11-05 ENCOUNTER — Encounter: Payer: Self-pay | Admitting: Internal Medicine

## 2016-11-05 VITALS — BP 128/86 | HR 96 | Temp 98.6°F | Resp 16

## 2016-11-05 DIAGNOSIS — L03116 Cellulitis of left lower limb: Secondary | ICD-10-CM | POA: Diagnosis not present

## 2016-11-05 MED ORDER — DOXYCYCLINE HYCLATE 100 MG PO TABS: 100.0000 mg | ORAL_TABLET | Freq: Two times a day (BID) | ORAL | 0 refills | Status: DC

## 2016-11-05 NOTE — Assessment & Plan Note (Signed)
Left leg redness, warmth, tenderness and chills/sweats/subjective fevers consistent with cellulitis.  No abscess  No injury to leg Start doxycycline 100 mg BID x 10 days If no improvement over the next 48 hrs or if symptoms worsen advised him to call

## 2016-11-05 NOTE — Patient Instructions (Signed)
Take the antibiotic as prescribed and complete it.  Call with any side effects.   Call if no improvement      Cellulitis, Adult Cellulitis is a skin infection. The infected area is usually red and tender. This condition occurs most often in the arms and lower legs. The infection can travel to the muscles, blood, and underlying tissue and become serious. It is very important to get treated for this condition. What are the causes? Cellulitis is caused by bacteria. The bacteria enter through a break in the skin, such as a cut, burn, insect bite, open sore, or crack. What increases the risk? This condition is more likely to occur in people who:  Have a weak defense system (immune system).  Have open wounds on the skin such as cuts, burns, bites, and scrapes. Bacteria can enter the body through these open wounds.  Are older.  Have diabetes.  Have a type of long-lasting (chronic) liver disease (cirrhosis) or kidney disease.  Use IV drugs.  What are the signs or symptoms? Symptoms of this condition include:  Redness, streaking, or spotting on the skin.  Swollen area of the skin.  Tenderness or pain when an area of the skin is touched.  Warm skin.  Fever.  Chills.  Blisters.  How is this diagnosed? This condition is diagnosed based on a medical history and physical exam. You may also have tests, including:  Blood tests.  Lab tests.  Imaging tests.  How is this treated? Treatment for this condition may include:  Medicines, such as antibiotic medicines or antihistamines.  Supportive care, such as rest and application of cold or warm cloths (cold or warm compresses) to the skin.  Hospital care, if the condition is severe.  The infection usually gets better within 1-2 days of treatment. Follow these instructions at home:  Take over-the-counter and prescription medicines only as told by your health care provider.  If you were prescribed an antibiotic medicine,  take it as told by your health care provider. Do not stop taking the antibiotic even if you start to feel better.  Drink enough fluid to keep your urine clear or pale yellow.  Do not touch or rub the infected area.  Raise (elevate) the infected area above the level of your heart while you are sitting or lying down.  Apply warm or cold compresses to the affected area as told by your health care provider.  Keep all follow-up visits as told by your health care provider. This is important. These visits let your health care provider make sure a more serious infection is not developing. Contact a health care provider if:  You have a fever.  Your symptoms do not improve within 1-2 days of starting treatment.  Your bone or joint underneath the infected area becomes painful after the skin has healed.  Your infection returns in the same area or another area.  You notice a swollen bump in the infected area.  You develop new symptoms.  You have a general ill feeling (malaise) with muscle aches and pains. Get help right away if:  Your symptoms get worse.  You feel very sleepy.  You develop vomiting or diarrhea that persists.  You notice red streaks coming from the infected area.  Your red area gets larger or turns dark in color. This information is not intended to replace advice given to you by your health care provider. Make sure you discuss any questions you have with your health care provider. Document Released:  12/27/2004 Document Revised: 07/28/2015 Document Reviewed: 01/26/2015 Elsevier Interactive Patient Education  2017 [employer].
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
## 2016-11-08 ENCOUNTER — Encounter: Payer: Self-pay | Admitting: Internal Medicine

## 2016-11-13 ENCOUNTER — Encounter: Payer: Self-pay | Admitting: Internal Medicine

## 2016-11-13 ENCOUNTER — Ambulatory Visit (INDEPENDENT_AMBULATORY_CARE_PROVIDER_SITE_OTHER): Payer: BC Managed Care – PPO | Admitting: Internal Medicine

## 2016-11-13 ENCOUNTER — Ambulatory Visit (HOSPITAL_COMMUNITY)
Admission: RE | Admit: 2016-11-13 | Discharge: 2016-11-13 | Disposition: A | Discharge status: Home / Self Care | Payer: BC Managed Care – PPO | Source: Ambulatory Visit | Attending: Cardiovascular Disease | Admitting: Cardiovascular Disease

## 2016-11-13 ENCOUNTER — Telehealth: Payer: Self-pay | Admitting: Internal Medicine

## 2016-11-13 VITALS — BP 132/88 | HR 103 | Temp 97.4°F | Resp 18

## 2016-11-13 DIAGNOSIS — M7989 Other specified soft tissue disorders: Secondary | ICD-10-CM | POA: Insufficient documentation

## 2016-11-13 DIAGNOSIS — R600 Localized edema: Secondary | ICD-10-CM | POA: Insufficient documentation

## 2016-11-13 MED ORDER — AMOXICILLIN-POT CLAVULANATE 875-125 MG PO TABS: 1.0000 | ORAL_TABLET | Freq: Two times a day (BID) | ORAL | 0 refills | Status: DC

## 2016-11-13 NOTE — Patient Instructions (Signed)
An Korea of your left leg was ordered.
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
## 2016-11-13 NOTE — Assessment & Plan Note (Addendum)
Need to rule out a DVT US of the left lower leg today,which is negative for clot If no blood clot he likely still has cellulitis - not responding to doxycycline Start augmentin, complete doxycycline Refer to vascular  If no improvement in swelling will order Ct scan or leg or consider ED evaluation and possible IV abx

## 2016-11-13 NOTE — Telephone Encounter (Signed)
Spoke with pt to inform. Pt understood.  

## 2016-11-13 NOTE — Telephone Encounter (Signed)
The [place] of his leg did not show a blood clot.     There still could be an infection not responding to the antibiotic.  He should finish the current antibiotic.  We will start a new one as well.  I have ordered an urgent referral to vascular surgery for their assessment.  If there is no improvement in the next two days with the new antibiotic he may need to be in the hospital for IV antibiotics and further evaluation of the leg.     New antibiotic - augmentin sent to [facility].

## 2016-11-16 ENCOUNTER — Encounter: Payer: Self-pay | Admitting: Internal Medicine

## 2016-11-18 ENCOUNTER — Encounter: Payer: Self-pay | Admitting: Internal Medicine
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
## 2016-11-19 ENCOUNTER — Ambulatory Visit (INDEPENDENT_AMBULATORY_CARE_PROVIDER_SITE_OTHER): Payer: BC Managed Care – PPO | Admitting: Internal Medicine

## 2016-11-19 ENCOUNTER — Ambulatory Visit: Payer: BC Managed Care – PPO | Admitting: Internal Medicine

## 2016-11-19 ENCOUNTER — Encounter: Payer: Self-pay | Admitting: Internal Medicine

## 2016-11-19 VITALS — BP 112/76 | HR 84 | Resp 16

## 2016-11-19 DIAGNOSIS — L03116 Cellulitis of left lower limb: Secondary | ICD-10-CM | POA: Diagnosis not present

## 2016-11-19 DIAGNOSIS — M7989 Other specified soft tissue disorders: Secondary | ICD-10-CM

## 2016-11-19 MED ORDER — AMOXICILLIN-POT CLAVULANATE 875-125 MG PO TABS: 1.0000 | ORAL_TABLET | Freq: Two times a day (BID) | ORAL | 0 refills | Status: DC

## 2016-11-19 MED ORDER — FUROSEMIDE 20 MG PO TABS: 10.0000 mg | ORAL_TABLET | Freq: Every day | ORAL | 0 refills | Status: DC
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
## 2016-11-19 NOTE — Assessment & Plan Note (Signed)
Related to cellulitis No DVT by [place] Will start lasix 10 mg daily for a few days to help decrease significant swelling

## 2016-11-19 NOTE — Assessment & Plan Note (Signed)
Improving on Augmentin - will extend for an additional 4 days Discussed ceftriaxone injection but he deferred Elevate leg when sitting, but remain active He will update me at the end of the week and return if he does not see continued improvement

## 2016-11-19 NOTE — Patient Instructions (Signed)
Start lasix 10 mg daily for a few days to help with swelling.  Continue Augmentin for a full 2 weeks.  Additional pills sent to your pharmacy.  Probiotics may help prevent diarrhea.  If you get diarrhea please call.    Elevate your legs when you are sitting.   Move around when possible.

## 2016-11-21 ENCOUNTER — Encounter: Payer: Self-pay | Admitting: Internal Medicine

## 2016-11-22 ENCOUNTER — Encounter: Payer: Self-pay | Admitting: Internal Medicine

## 2016-11-23 DIAGNOSIS — Z0279 Encounter for issue of other medical certificate: Secondary | ICD-10-CM

## 2016-11-26 ENCOUNTER — Other Ambulatory Visit: Payer: Self-pay

## 2016-11-26 DIAGNOSIS — M7989 Other specified soft tissue disorders: Secondary | ICD-10-CM

## 2016-11-27 ENCOUNTER — Ambulatory Visit (INDEPENDENT_AMBULATORY_CARE_PROVIDER_SITE_OTHER): Payer: BC Managed Care – PPO | Admitting: *Deleted

## 2016-11-27 ENCOUNTER — Telehealth: Payer: Self-pay | Admitting: Cardiology

## 2016-11-27 DIAGNOSIS — I442 Atrioventricular block, complete: Secondary | ICD-10-CM | POA: Diagnosis not present

## 2016-11-27 NOTE — Telephone Encounter (Signed)
LMOVM reminding pt to send remote transmission.   

## 2016-11-27 NOTE — Progress Notes (Signed)
Remote pacemaker transmission.   

## 2016-11-28 LAB — CUP PACEART REMOTE DEVICE CHECK
Battery Remaining Longevity: 87 mo
Brady Statistic AP VS Percent: 0.01 %
Brady Statistic AS VP Percent: 78.31 %
Brady Statistic AS VS Percent: 0.01 %
Brady Statistic RV Percent Paced: 99.98 %
Date Time Interrogation Session: 20180828212428
Implantable Lead Implant Date: 20171124
Implantable Lead Location: 753858
Implantable Lead Location: 753860
Implantable Lead Model: 5076
Implantable Pulse Generator Implant Date: 20171124
Lead Channel Impedance Value: 475 Ohm
Lead Channel Impedance Value: 475 Ohm
Lead Channel Impedance Value: 513 Ohm
Lead Channel Impedance Value: 513 Ohm
Lead Channel Impedance Value: 836 Ohm
Lead Channel Impedance Value: 855 Ohm
Lead Channel Pacing Threshold Pulse Width: 0.4 ms
Lead Channel Pacing Threshold Pulse Width: 1 ms
Lead Channel Sensing Intrinsic Amplitude: 5.875 mV
Lead Channel Sensing Intrinsic Amplitude: 9.25 mV
Lead Channel Setting Pacing Amplitude: 2.5 V
Lead Channel Setting Pacing Amplitude: 2.5 V
Lead Channel Setting Pacing Pulse Width: 1 ms
MDC IDC LEAD IMPLANT DT: 20130401
MDC IDC LEAD IMPLANT DT: 20130401
MDC IDC LEAD LOCATION: 753859
MDC IDC MSMT BATTERY VOLTAGE: 3 V
MDC IDC MSMT LEADCHNL LV IMPEDANCE VALUE: 456 Ohm
MDC IDC MSMT LEADCHNL LV IMPEDANCE VALUE: 494 Ohm
MDC IDC MSMT LEADCHNL LV IMPEDANCE VALUE: 760 Ohm
MDC IDC MSMT LEADCHNL LV IMPEDANCE VALUE: 798 Ohm
MDC IDC MSMT LEADCHNL LV IMPEDANCE VALUE: 874 Ohm
MDC IDC MSMT LEADCHNL LV PACING THRESHOLD AMPLITUDE: 1 V
MDC IDC MSMT LEADCHNL RA IMPEDANCE VALUE: 323 Ohm
MDC IDC MSMT LEADCHNL RA IMPEDANCE VALUE: 589 Ohm
MDC IDC MSMT LEADCHNL RA PACING THRESHOLD AMPLITUDE: 1.125 V
MDC IDC MSMT LEADCHNL RA SENSING INTR AMPL: 5.875 mV
MDC IDC MSMT LEADCHNL RV IMPEDANCE VALUE: 437 Ohm
MDC IDC MSMT LEADCHNL RV SENSING INTR AMPL: 9.25 mV
MDC IDC SET LEADCHNL RA PACING AMPLITUDE: 2.25 V
MDC IDC SET LEADCHNL RV PACING PULSEWIDTH: 0.6 ms
MDC IDC SET LEADCHNL RV SENSING SENSITIVITY: 4 mV
MDC IDC STAT BRADY AP VP PERCENT: 21.68 %
MDC IDC STAT BRADY RA PERCENT PACED: 21.64 %

## 2016-11-30 ENCOUNTER — Encounter: Payer: Self-pay | Admitting: Internal Medicine

## 2016-12-07 ENCOUNTER — Encounter: Payer: Self-pay | Admitting: Cardiology

## 2016-12-28 ENCOUNTER — Encounter (HOSPITAL_COMMUNITY): Payer: BC Managed Care – PPO

## 2016-12-28 ENCOUNTER — Encounter: Payer: BC Managed Care – PPO | Admitting: Vascular Surgery
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
## 2017-01-08 ENCOUNTER — Ambulatory Visit (INDEPENDENT_AMBULATORY_CARE_PROVIDER_SITE_OTHER): Payer: BC Managed Care – PPO | Admitting: Internal Medicine

## 2017-01-08 ENCOUNTER — Encounter: Payer: Self-pay | Admitting: Internal Medicine

## 2017-01-08 VITALS — BP 124/86 | HR 73 | Temp 98.3°F | Resp 16 | Ht 66.0 in | Wt 185.0 lb

## 2017-01-08 DIAGNOSIS — R600 Localized edema: Secondary | ICD-10-CM

## 2017-01-08 DIAGNOSIS — I10 Essential (primary) hypertension: Secondary | ICD-10-CM | POA: Diagnosis not present

## 2017-01-08 DIAGNOSIS — Q743 Arthrogryposis multiplex congenita: Secondary | ICD-10-CM | POA: Diagnosis not present

## 2017-01-08 NOTE — Patient Instructions (Addendum)
  No immunizations administered today.   Medications reviewed and updated.  No changes recommended at this time.    Please followup in 6 months   

## 2017-01-09 NOTE — Assessment & Plan Note (Signed)
Left lower extremity> right lower extremity Improved, stable

## 2017-01-09 NOTE — Assessment & Plan Note (Signed)
Disc disease and appears his ability to perform his daily activities of living. A power wheelchair is medically necessary to perform his ADLs

## 2017-01-09 NOTE — Assessment & Plan Note (Signed)
BP well controlled Current regimen effective and well tolerated Continue current medications at current doses  

## 2017-01-14 ENCOUNTER — Ambulatory Visit (INDEPENDENT_AMBULATORY_CARE_PROVIDER_SITE_OTHER)
Admission: RE | Admit: 2017-01-14 | Discharge: 2017-01-14 | Disposition: A | Discharge status: Home / Self Care | Payer: BC Managed Care – PPO | Source: Ambulatory Visit | Attending: Internal Medicine | Admitting: Internal Medicine

## 2017-01-14 ENCOUNTER — Encounter: Payer: Self-pay | Admitting: Internal Medicine

## 2017-01-14 ENCOUNTER — Ambulatory Visit (INDEPENDENT_AMBULATORY_CARE_PROVIDER_SITE_OTHER): Payer: BC Managed Care – PPO | Admitting: Internal Medicine

## 2017-01-14 ENCOUNTER — Other Ambulatory Visit (INDEPENDENT_AMBULATORY_CARE_PROVIDER_SITE_OTHER): Payer: BC Managed Care – PPO

## 2017-01-14 VITALS — BP 132/94 | HR 86 | Temp 98.8°F | Resp 16

## 2017-01-14 DIAGNOSIS — R05 Cough: Secondary | ICD-10-CM | POA: Diagnosis not present

## 2017-01-14 DIAGNOSIS — R059 Cough, unspecified: Secondary | ICD-10-CM

## 2017-01-14 DIAGNOSIS — R5383 Other fatigue: Secondary | ICD-10-CM | POA: Diagnosis not present

## 2017-01-14 LAB — CBC WITH DIFFERENTIAL/PLATELET
BASOS PCT: 0.7 % (ref 0.0–3.0)
Basophils Absolute: 0.1 10*3/uL (ref 0.0–0.1)
EOS PCT: 2 % (ref 0.0–5.0)
Eosinophils Absolute: 0.2 10*3/uL (ref 0.0–0.7)
HEMATOCRIT: 46.1 % (ref 39.0–52.0)
Hemoglobin: 15.2 g/dL (ref 13.0–17.0)
LYMPHS ABS: 2.3 10*3/uL (ref 0.7–4.0)
LYMPHS PCT: 22.7 % (ref 12.0–46.0)
MCHC: 32.9 g/dL (ref 30.0–36.0)
MCV: 94.7 fl (ref 78.0–100.0)
MONOS PCT: 7.7 % (ref 3.0–12.0)
Monocytes Absolute: 0.8 10*3/uL (ref 0.1–1.0)
NEUTROS ABS: 6.7 10*3/uL (ref 1.4–7.7)
NEUTROS PCT: 66.9 % (ref 43.0–77.0)
PLATELETS: 312 10*3/uL (ref 150.0–400.0)
RBC: 4.87 Mil/uL (ref 4.22–5.81)
RDW: 14.5 % (ref 11.5–15.5)
WBC: 10.1 10*3/uL (ref 4.0–10.5)

## 2017-01-14 LAB — COMPREHENSIVE METABOLIC PANEL
ALK PHOS: 63 U/L (ref 39–117)
ALT: 12 U/L (ref 0–53)
AST: 12 U/L (ref 0–37)
Albumin: 4.3 g/dL (ref 3.5–5.2)
BILIRUBIN TOTAL: 0.7 mg/dL (ref 0.2–1.2)
BUN: 18 mg/dL (ref 6–23)
CALCIUM: 9.8 mg/dL (ref 8.4–10.5)
CO2: 23 meq/L (ref 19–32)
Chloride: 103 mEq/L (ref 96–112)
Creatinine, Ser: 0.53 mg/dL (ref 0.40–1.50)
GFR: 174.72 mL/min (ref >60.00)
GLUCOSE: 107 mg/dL — AB (ref 70–99)
POTASSIUM: 3.9 meq/L (ref 3.5–5.1)
Sodium: 136 mEq/L (ref 135–145)
Total Protein: 8 g/dL (ref 6.0–8.3)

## 2017-01-14 LAB — TSH: TSH: 0.95 u[IU]/mL (ref 0.35–4.50)

## 2017-01-14 LAB — C-REACTIVE PROTEIN: CRP: 0.1 mg/dL — ABNORMAL LOW (ref 0.5–20.0)

## 2017-01-14 LAB — SEDIMENTATION RATE: SED RATE: 32 mm/h — AB (ref 0–20)

## 2017-01-14 NOTE — Assessment & Plan Note (Signed)
Mild, occasional - does not sound like it is infective, but given his other symptoms will check a CXR today
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
## 2017-01-14 NOTE — Assessment & Plan Note (Addendum)
?  Infection vs other cause Check labs - cbc, cmp, tsh, esr/crp to help figure out if there is an infection Symptomatic treatment for now since cause is not known

## 2017-01-14 NOTE — Patient Instructions (Signed)
  Test(s) ordered today. Your results will be released to MyChart (or called to you) after review, usually within 72hours after test completion. If any changes need to be made, you will be notified at that same time.   Medications reviewed and updated.  No changes recommended at this time.  Call if no improvement    

## 2017-01-15 ENCOUNTER — Telehealth: Payer: Self-pay | Admitting: Internal Medicine

## 2017-01-15 NOTE — Telephone Encounter (Signed)
Pt called checking on the labs that he had done yesterday.

## 2017-01-15 NOTE — Telephone Encounter (Signed)
Advised pt when call about chest x-ray that labs have not been released yet and would contact him when they were.

## 2017-01-16 ENCOUNTER — Encounter: Payer: Self-pay | Admitting: Internal Medicine

## 2017-02-26 ENCOUNTER — Telehealth: Payer: Self-pay | Admitting: Cardiology

## 2017-02-26 ENCOUNTER — Ambulatory Visit (INDEPENDENT_AMBULATORY_CARE_PROVIDER_SITE_OTHER): Payer: BC Managed Care – PPO | Admitting: *Deleted

## 2017-02-26 DIAGNOSIS — I420 Dilated cardiomyopathy: Secondary | ICD-10-CM

## 2017-02-26 DIAGNOSIS — I442 Atrioventricular block, complete: Secondary | ICD-10-CM

## 2017-02-26 DIAGNOSIS — Z950 Presence of cardiac pacemaker: Secondary | ICD-10-CM

## 2017-02-26 NOTE — Telephone Encounter (Signed)
LMOVM reminding pt to send remote transmission.   

## 2017-02-27 NOTE — Progress Notes (Signed)
Remote pacemaker transmission.   

## 2017-03-01 ENCOUNTER — Encounter: Payer: Self-pay | Admitting: Cardiology

## 2017-03-01 LAB — CUP PACEART REMOTE DEVICE CHECK
Brady Statistic AP VP Percent: 14.1 %
Brady Statistic AP VS Percent: 0.01 %
Brady Statistic AS VP Percent: 85.88 %
Brady Statistic RA Percent Paced: 14.06 %
Brady Statistic RV Percent Paced: 99.98 %
Implantable Lead Implant Date: 20171124
Implantable Lead Location: 753859
Implantable Lead Model: 5076
Implantable Lead Model: 5076
Lead Channel Impedance Value: 323 Ohm
Lead Channel Impedance Value: 418 Ohm
Lead Channel Impedance Value: 456 Ohm
Lead Channel Impedance Value: 475 Ohm
Lead Channel Impedance Value: 760 Ohm
Lead Channel Impedance Value: 798 Ohm
Lead Channel Impedance Value: 817 Ohm
Lead Channel Impedance Value: 836 Ohm
Lead Channel Pacing Threshold Pulse Width: 0.4 ms
Lead Channel Sensing Intrinsic Amplitude: 9.25 mV
Lead Channel Sensing Intrinsic Amplitude: 9.25 mV
Lead Channel Setting Pacing Amplitude: 2.5 V
Lead Channel Setting Pacing Amplitude: 2.5 V
Lead Channel Setting Pacing Pulse Width: 0.6 ms
Lead Channel Setting Pacing Pulse Width: 1 ms
MDC IDC LEAD IMPLANT DT: 20130401
MDC IDC LEAD IMPLANT DT: 20130401
MDC IDC LEAD LOCATION: 753858
MDC IDC LEAD LOCATION: 753860
MDC IDC MSMT BATTERY REMAINING LONGEVITY: 83 mo
MDC IDC MSMT BATTERY VOLTAGE: 2.98 V
MDC IDC MSMT LEADCHNL LV IMPEDANCE VALUE: 475 Ohm
MDC IDC MSMT LEADCHNL LV IMPEDANCE VALUE: 475 Ohm
MDC IDC MSMT LEADCHNL LV IMPEDANCE VALUE: 513 Ohm
MDC IDC MSMT LEADCHNL LV IMPEDANCE VALUE: 513 Ohm
MDC IDC MSMT LEADCHNL LV IMPEDANCE VALUE: 798 Ohm
MDC IDC MSMT LEADCHNL LV PACING THRESHOLD AMPLITUDE: 1 V
MDC IDC MSMT LEADCHNL LV PACING THRESHOLD PULSEWIDTH: 1 ms
MDC IDC MSMT LEADCHNL RA IMPEDANCE VALUE: 589 Ohm
MDC IDC MSMT LEADCHNL RA PACING THRESHOLD AMPLITUDE: 1.125 V
MDC IDC MSMT LEADCHNL RA SENSING INTR AMPL: 7.125 mV
MDC IDC MSMT LEADCHNL RA SENSING INTR AMPL: 7.125 mV
MDC IDC PG IMPLANT DT: 20171124
MDC IDC SESS DTM: 20181127232732
MDC IDC SET LEADCHNL RA PACING AMPLITUDE: 2.25 V
MDC IDC SET LEADCHNL RV SENSING SENSITIVITY: 4 mV
MDC IDC STAT BRADY AS VS PERCENT: 0.01 %

## 2017-04-18 ENCOUNTER — Encounter: Payer: Self-pay | Admitting: Internal Medicine

## 2017-04-23 ENCOUNTER — Other Ambulatory Visit: Payer: Self-pay | Admitting: Internal Medicine

## 2017-04-23 ENCOUNTER — Other Ambulatory Visit: Payer: Self-pay | Admitting: Physician Assistant

## 2017-05-28 ENCOUNTER — Telehealth: Payer: Self-pay | Admitting: Cardiology

## 2017-05-28 ENCOUNTER — Ambulatory Visit (INDEPENDENT_AMBULATORY_CARE_PROVIDER_SITE_OTHER): Payer: BC Managed Care – PPO | Admitting: *Deleted

## 2017-05-28 DIAGNOSIS — I442 Atrioventricular block, complete: Secondary | ICD-10-CM

## 2017-05-28 DIAGNOSIS — Z950 Presence of cardiac pacemaker: Secondary | ICD-10-CM

## 2017-05-28 DIAGNOSIS — I420 Dilated cardiomyopathy: Secondary | ICD-10-CM

## 2017-05-28 NOTE — Telephone Encounter (Signed)
LMOVM reminding pt to send remote transmission.   

## 2017-05-29 NOTE — Progress Notes (Signed)
Remote pacemaker transmission.   

## 2017-05-30 ENCOUNTER — Encounter: Payer: Self-pay | Admitting: Cardiology

## 2017-06-14 LAB — CUP PACEART REMOTE DEVICE CHECK
Battery Remaining Longevity: 81 mo
Brady Statistic AP VP Percent: 24.89 %
Brady Statistic AP VS Percent: 0.01 %
Brady Statistic AS VP Percent: 75.07 %
Brady Statistic RA Percent Paced: 24.84 %
Brady Statistic RV Percent Paced: 99.96 %
Implantable Lead Implant Date: 20130401
Implantable Lead Implant Date: 20171124
Implantable Lead Location: 753858
Implantable Lead Location: 753859
Implantable Lead Location: 753860
Implantable Lead Model: 5076
Implantable Lead Model: 5076
Lead Channel Impedance Value: 342 Ohm
Lead Channel Impedance Value: 437 Ohm
Lead Channel Impedance Value: 475 Ohm
Lead Channel Impedance Value: 494 Ohm
Lead Channel Impedance Value: 494 Ohm
Lead Channel Impedance Value: 570 Ohm
Lead Channel Impedance Value: 817 Ohm
Lead Channel Impedance Value: 836 Ohm
Lead Channel Impedance Value: 855 Ohm
Lead Channel Pacing Threshold Amplitude: 1 V
Lead Channel Sensing Intrinsic Amplitude: 9.25 mV
Lead Channel Sensing Intrinsic Amplitude: 9.25 mV
Lead Channel Setting Pacing Amplitude: 2.5 V
Lead Channel Setting Pacing Amplitude: 2.5 V
MDC IDC LEAD IMPLANT DT: 20130401
MDC IDC MSMT BATTERY VOLTAGE: 2.98 V
MDC IDC MSMT LEADCHNL LV IMPEDANCE VALUE: 494 Ohm
MDC IDC MSMT LEADCHNL LV IMPEDANCE VALUE: 513 Ohm
MDC IDC MSMT LEADCHNL LV IMPEDANCE VALUE: 513 Ohm
MDC IDC MSMT LEADCHNL LV IMPEDANCE VALUE: 798 Ohm
MDC IDC MSMT LEADCHNL LV IMPEDANCE VALUE: 817 Ohm
MDC IDC MSMT LEADCHNL LV PACING THRESHOLD PULSEWIDTH: 1 ms
MDC IDC MSMT LEADCHNL RA PACING THRESHOLD AMPLITUDE: 1.125 V
MDC IDC MSMT LEADCHNL RA PACING THRESHOLD PULSEWIDTH: 0.4 ms
MDC IDC MSMT LEADCHNL RA SENSING INTR AMPL: 6.875 mV
MDC IDC MSMT LEADCHNL RA SENSING INTR AMPL: 6.875 mV
MDC IDC PG IMPLANT DT: 20171124
MDC IDC SESS DTM: 20190226231905
MDC IDC SET LEADCHNL LV PACING PULSEWIDTH: 1 ms
MDC IDC SET LEADCHNL RA PACING AMPLITUDE: 2.25 V
MDC IDC SET LEADCHNL RV PACING PULSEWIDTH: 0.6 ms
MDC IDC SET LEADCHNL RV SENSING SENSITIVITY: 4 mV
MDC IDC STAT BRADY AS VS PERCENT: 0.02 %

## 2017-06-18 ENCOUNTER — Other Ambulatory Visit: Payer: Self-pay | Admitting: Internal Medicine

## 2017-08-28 ENCOUNTER — Ambulatory Visit (INDEPENDENT_AMBULATORY_CARE_PROVIDER_SITE_OTHER): Payer: BC Managed Care – PPO | Admitting: *Deleted

## 2017-08-28 DIAGNOSIS — I442 Atrioventricular block, complete: Secondary | ICD-10-CM | POA: Diagnosis not present

## 2017-08-28 DIAGNOSIS — I420 Dilated cardiomyopathy: Secondary | ICD-10-CM

## 2017-08-30 LAB — CUP PACEART REMOTE DEVICE CHECK
Battery Remaining Longevity: 80 mo
Battery Voltage: 2.97 V
Brady Statistic AS VP Percent: 71.48 %
Brady Statistic RV Percent Paced: 99.97 %
Date Time Interrogation Session: 20190530125236
Implantable Lead Implant Date: 20171124
Implantable Lead Location: 753860
Implantable Lead Model: 4598
Lead Channel Impedance Value: 513 Ohm
Lead Channel Impedance Value: 513 Ohm
Lead Channel Impedance Value: 836 Ohm
Lead Channel Impedance Value: 855 Ohm
Lead Channel Pacing Threshold Amplitude: 0.75 V
Lead Channel Pacing Threshold Pulse Width: 0.4 ms
Lead Channel Pacing Threshold Pulse Width: 1 ms
Lead Channel Sensing Intrinsic Amplitude: 7.125 mV
Lead Channel Sensing Intrinsic Amplitude: 9.25 mV
Lead Channel Setting Pacing Amplitude: 2.5 V
Lead Channel Setting Pacing Pulse Width: 0.6 ms
Lead Channel Setting Pacing Pulse Width: 1 ms
Lead Channel Setting Sensing Sensitivity: 4 mV
MDC IDC LEAD IMPLANT DT: 20130401
MDC IDC LEAD IMPLANT DT: 20130401
MDC IDC LEAD LOCATION: 753858
MDC IDC LEAD LOCATION: 753859
MDC IDC MSMT LEADCHNL LV IMPEDANCE VALUE: 475 Ohm
MDC IDC MSMT LEADCHNL LV IMPEDANCE VALUE: 494 Ohm
MDC IDC MSMT LEADCHNL LV IMPEDANCE VALUE: 513 Ohm
MDC IDC MSMT LEADCHNL LV IMPEDANCE VALUE: 513 Ohm
MDC IDC MSMT LEADCHNL LV IMPEDANCE VALUE: 779 Ohm
MDC IDC MSMT LEADCHNL LV IMPEDANCE VALUE: 817 Ohm
MDC IDC MSMT LEADCHNL LV IMPEDANCE VALUE: 855 Ohm
MDC IDC MSMT LEADCHNL LV PACING THRESHOLD AMPLITUDE: 1 V
MDC IDC MSMT LEADCHNL RA IMPEDANCE VALUE: 342 Ohm
MDC IDC MSMT LEADCHNL RA IMPEDANCE VALUE: 437 Ohm
MDC IDC MSMT LEADCHNL RA SENSING INTR AMPL: 7.125 mV
MDC IDC MSMT LEADCHNL RV IMPEDANCE VALUE: 475 Ohm
MDC IDC MSMT LEADCHNL RV SENSING INTR AMPL: 9.25 mV
MDC IDC PG IMPLANT DT: 20171124
MDC IDC SET LEADCHNL LV PACING AMPLITUDE: 2.5 V
MDC IDC SET LEADCHNL RA PACING AMPLITUDE: 2 V
MDC IDC STAT BRADY AP VP PERCENT: 28.49 %
MDC IDC STAT BRADY AP VS PERCENT: 0.01 %
MDC IDC STAT BRADY AS VS PERCENT: 0.02 %
MDC IDC STAT BRADY RA PERCENT PACED: 28.45 %

## 2017-08-30 NOTE — Progress Notes (Signed)
Remote pacemaker transmission.   

## 2017-09-03 ENCOUNTER — Other Ambulatory Visit: Payer: Self-pay | Admitting: Internal Medicine

## 2017-11-02 ENCOUNTER — Other Ambulatory Visit: Payer: Self-pay | Admitting: Internal Medicine

## 2017-11-05 ENCOUNTER — Encounter: Payer: Self-pay | Admitting: Internal Medicine

## 2017-11-05 MED ORDER — CARVEDILOL 3.125 MG PO TABS: 3.1250 mg | ORAL_TABLET | Freq: Two times a day (BID) | ORAL | 0 refills | Status: DC

## 2017-11-05 MED ORDER — ATORVASTATIN CALCIUM 20 MG PO TABS: 20.0000 mg | ORAL_TABLET | Freq: Every day | ORAL | 0 refills | Status: DC
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
## 2017-11-25 ENCOUNTER — Ambulatory Visit: Payer: BC Managed Care – PPO | Admitting: Internal Medicine

## 2017-11-25 ENCOUNTER — Encounter: Payer: Self-pay | Admitting: Internal Medicine

## 2017-11-25 ENCOUNTER — Telehealth: Payer: Self-pay | Admitting: Internal Medicine

## 2017-11-25 VITALS — BP 128/74 | HR 60 | Temp 98.6°F | Resp 16 | Ht 66.0 in

## 2017-11-25 DIAGNOSIS — M5137 Other intervertebral disc degeneration, lumbosacral region: Secondary | ICD-10-CM

## 2017-11-25 DIAGNOSIS — M25551 Pain in right hip: Secondary | ICD-10-CM | POA: Diagnosis not present

## 2017-11-25 DIAGNOSIS — Q743 Arthrogryposis multiplex congenita: Secondary | ICD-10-CM

## 2017-11-25 NOTE — Assessment & Plan Note (Signed)
Severe OA of lower back spine Follows with orthopedics Limited in treatment options because of his congenital disorder Difficulty doing daily activities and work activities Will take him out of work and will likely need short term and long term disability

## 2017-11-25 NOTE — Assessment & Plan Note (Signed)
Difficulty with transferring, difficulty with mobility Difficulty doing daily activities and work activities Will likely need short term and eventually long term disability

## 2017-11-25 NOTE — Telephone Encounter (Signed)
FMLA form have been received, I have completed the forms for the dates approved in the OV on 11/25/17.   Continuous time 11/19/17 to 04/01/18  Forms have been placed in providers box to review and sign.

## 2017-11-25 NOTE — Patient Instructions (Signed)
We will fill out the [facility] paperwork.  We will call you when the paperwork is done.

## 2017-11-25 NOTE — Assessment & Plan Note (Signed)
Chronic severe right hip pain due to severe OA Follows with orthopedics Options very limited due to congenital disorder Difficulty doing daily activities and work activities Will take out of work for a continuous time period Will work on short term and likely long term disability

## 2017-11-26 DIAGNOSIS — Z0279 Encounter for issue of other medical certificate: Secondary | ICD-10-CM

## 2017-11-26 NOTE — Telephone Encounter (Signed)
signed

## 2017-11-26 NOTE — Telephone Encounter (Signed)
Copy sent to scan & charged for.   Patient has been informed and will pick up the forms and take them where they need to go.

## 2017-11-27 IMAGING — DX DG CHEST 2V
2 series · 3 of 3 positions shown · non-contrast
Comparison: Chest x-ray of February 25, 2016

CLINICAL DATA: Nonproductive cough, chills, and fever for the past
6 days. History of cardiomyopathy in complete heart block with
pacemaker placement. Never smoked.

EXAM:
CHEST  2 VIEW

[Series 1: chest pa · 0.14mm/px · 2 of 2 slices shown]
[im 1/2]
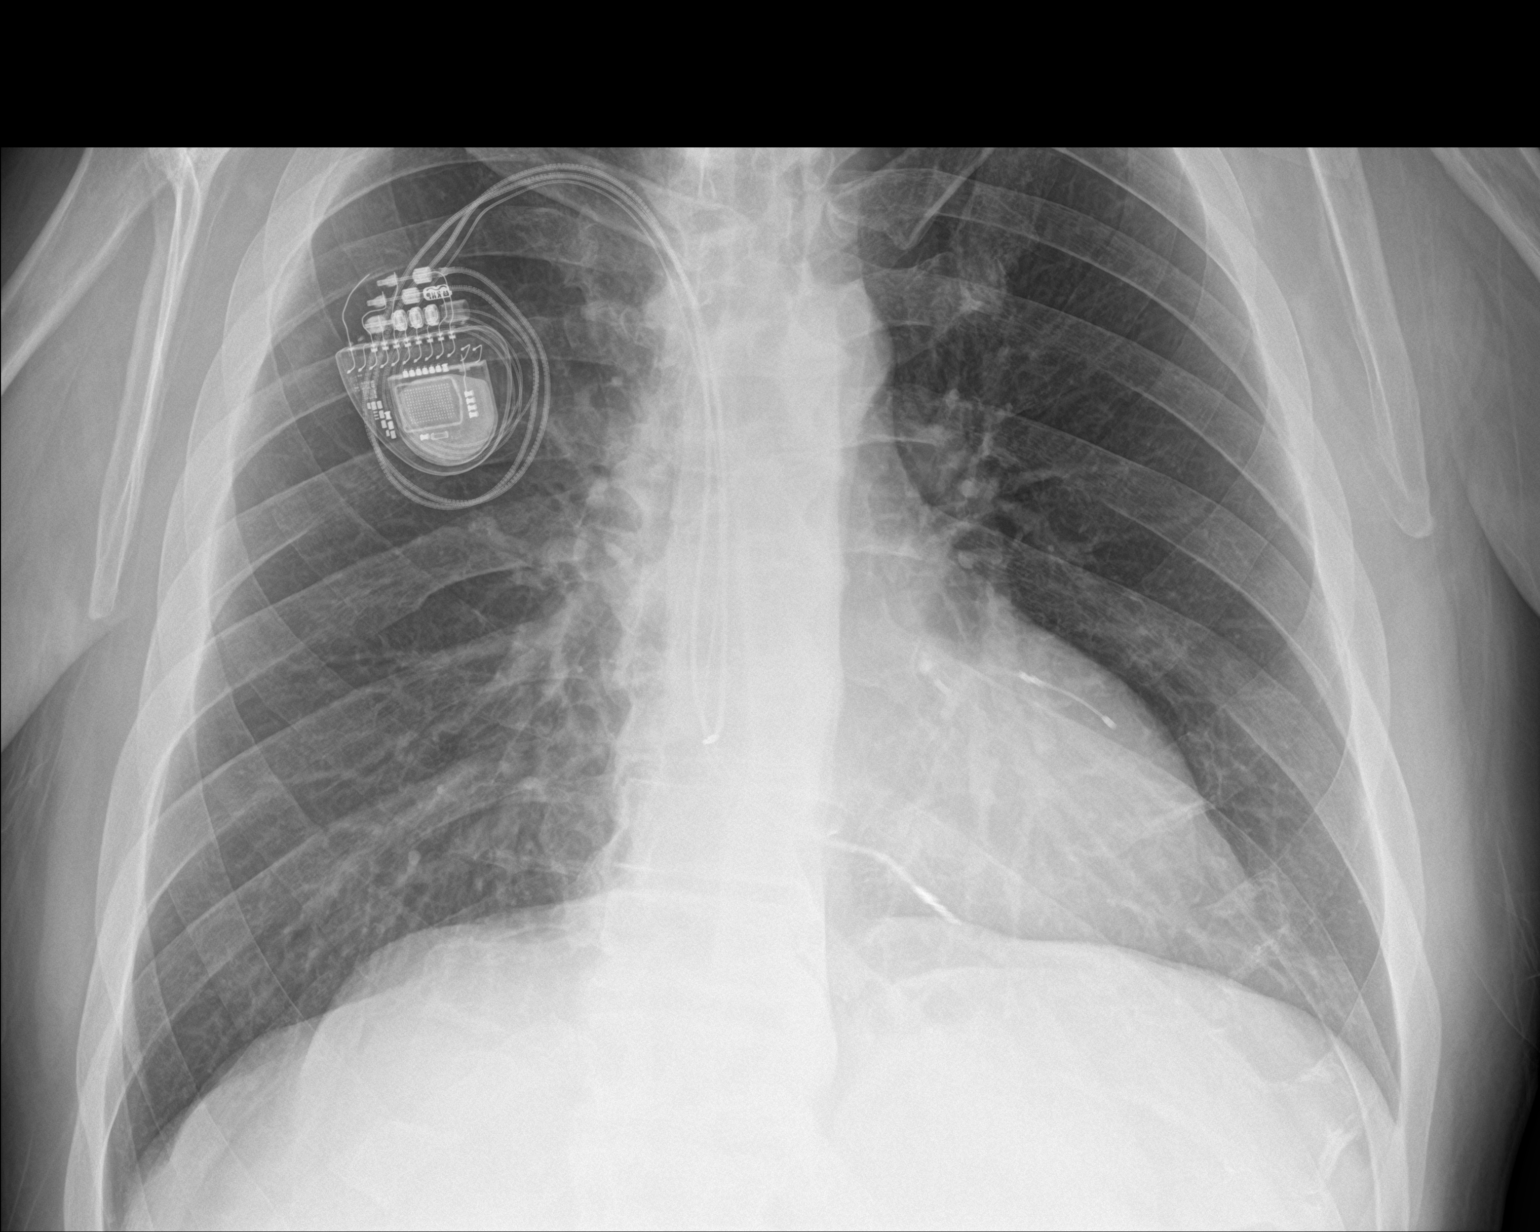
[im 2/2]
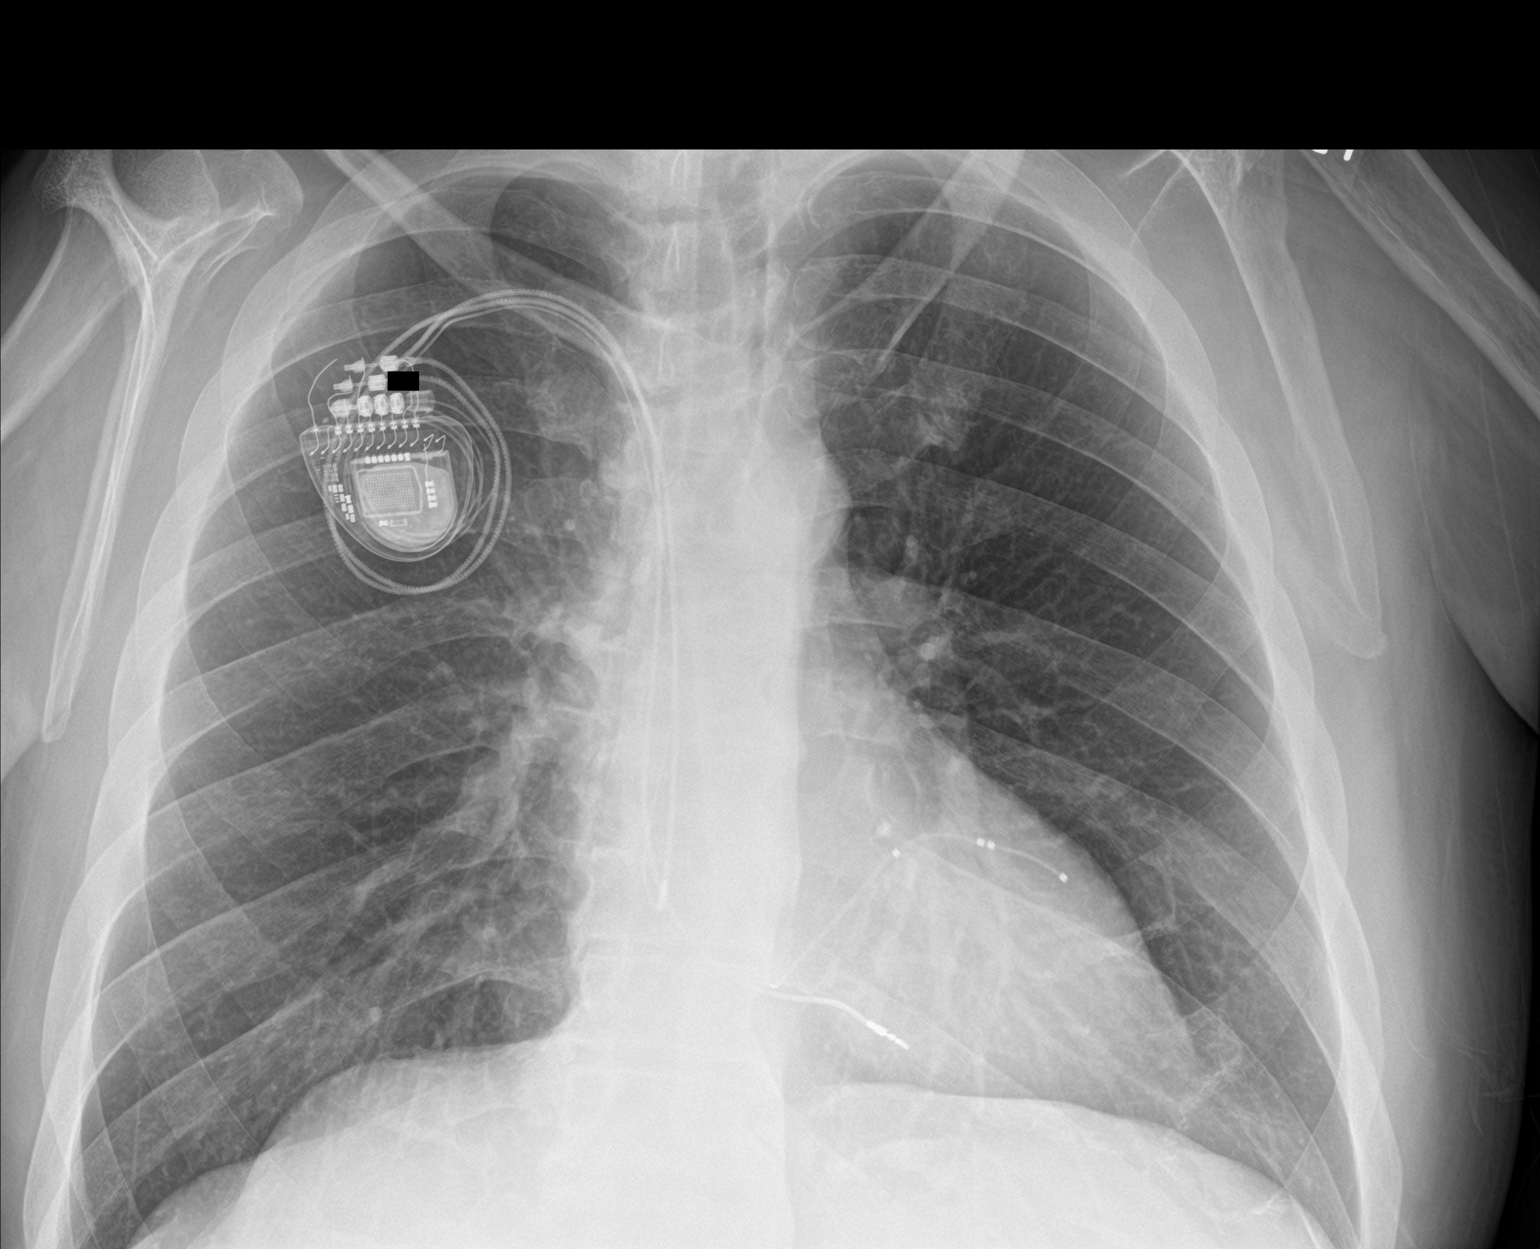

[chest lat]
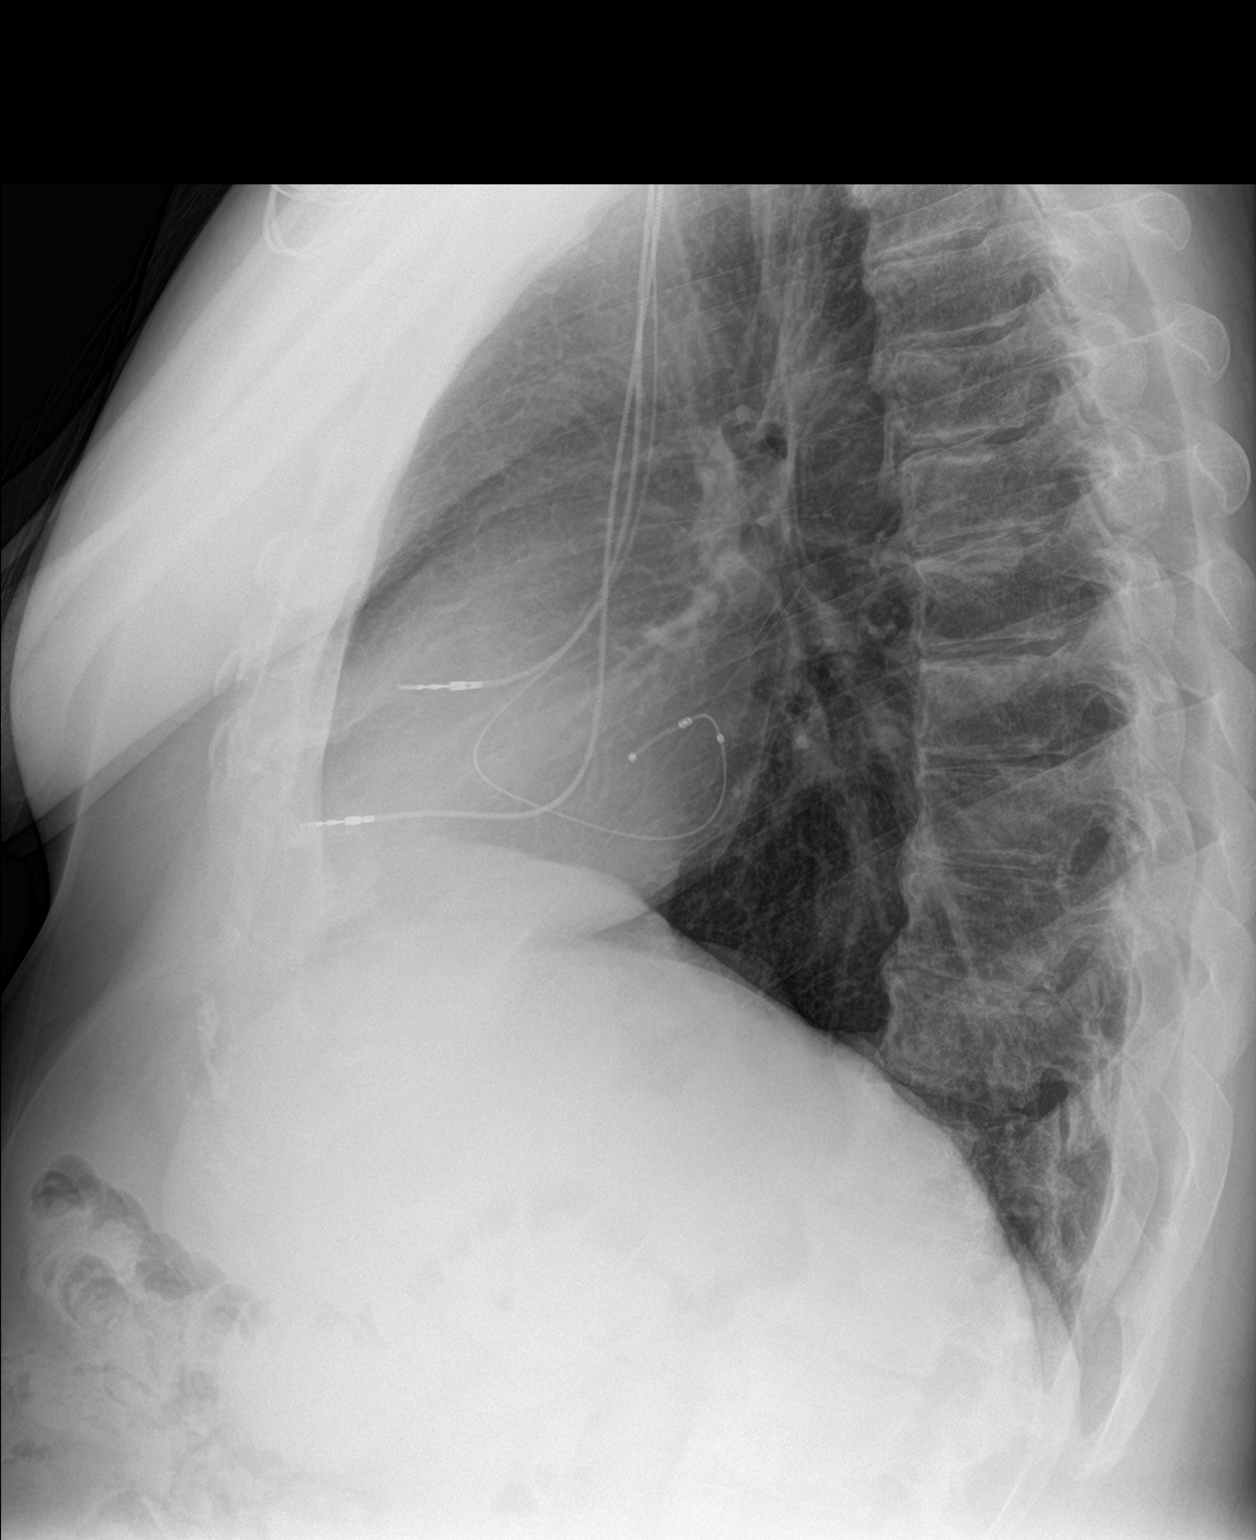

[3 of 3 positions shown; findings below may reference images not displayed]

FINDINGS: The lungs are adequately inflated. There is no focal infiltrate.
There is no pleural effusion. The heart and pulmonary vascularity
are normal. The ICD is in stable position. There is multilevel
degenerative disc disease of the thoracic spine.
IMPRESSION: There is no pneumonia nor other acute cardiopulmonary abnormality.

## 2017-11-28 ENCOUNTER — Telehealth: Payer: Self-pay

## 2017-11-28 ENCOUNTER — Encounter: Payer: BC Managed Care – PPO | Admitting: *Deleted

## 2017-11-28 NOTE — Telephone Encounter (Signed)
LMOVM reminding pt to send remote transmission.   

## 2017-11-29 ENCOUNTER — Encounter: Payer: Self-pay | Admitting: Cardiology

## 2017-12-03 ENCOUNTER — Ambulatory Visit (INDEPENDENT_AMBULATORY_CARE_PROVIDER_SITE_OTHER): Payer: BC Managed Care – PPO | Admitting: *Deleted

## 2017-12-03 DIAGNOSIS — I442 Atrioventricular block, complete: Secondary | ICD-10-CM | POA: Diagnosis not present

## 2017-12-03 NOTE — Progress Notes (Signed)
Remote pacemaker transmission.   

## 2017-12-25 LAB — CUP PACEART REMOTE DEVICE CHECK
Brady Statistic AP VP Percent: 31.52 %
Brady Statistic AP VS Percent: 0.01 %
Brady Statistic AS VP Percent: 68.42 %
Brady Statistic RA Percent Paced: 31.51 %
Implantable Lead Implant Date: 20130401
Implantable Lead Implant Date: 20130401
Implantable Lead Implant Date: 20171124
Implantable Lead Location: 753858
Implantable Lead Location: 753859
Implantable Lead Model: 5076
Implantable Lead Model: 5076
Lead Channel Impedance Value: 342 Ohm
Lead Channel Impedance Value: 418 Ohm
Lead Channel Impedance Value: 494 Ohm
Lead Channel Impedance Value: 513 Ohm
Lead Channel Impedance Value: 532 Ohm
Lead Channel Impedance Value: 589 Ohm
Lead Channel Impedance Value: 817 Ohm
Lead Channel Impedance Value: 836 Ohm
Lead Channel Impedance Value: 874 Ohm
Lead Channel Pacing Threshold Amplitude: 1 V
Lead Channel Pacing Threshold Pulse Width: 1 ms
Lead Channel Sensing Intrinsic Amplitude: 9.25 mV
Lead Channel Sensing Intrinsic Amplitude: 9.25 mV
Lead Channel Setting Pacing Amplitude: 2.5 V
Lead Channel Setting Pacing Pulse Width: 1 ms
MDC IDC LEAD LOCATION: 753860
MDC IDC MSMT BATTERY REMAINING LONGEVITY: 76 mo
MDC IDC MSMT BATTERY VOLTAGE: 2.97 V
MDC IDC MSMT LEADCHNL LV IMPEDANCE VALUE: 494 Ohm
MDC IDC MSMT LEADCHNL LV IMPEDANCE VALUE: 513 Ohm
MDC IDC MSMT LEADCHNL LV IMPEDANCE VALUE: 855 Ohm
MDC IDC MSMT LEADCHNL LV IMPEDANCE VALUE: 855 Ohm
MDC IDC MSMT LEADCHNL RA PACING THRESHOLD AMPLITUDE: 1 V
MDC IDC MSMT LEADCHNL RA PACING THRESHOLD PULSEWIDTH: 0.4 ms
MDC IDC MSMT LEADCHNL RA SENSING INTR AMPL: 6.75 mV
MDC IDC MSMT LEADCHNL RA SENSING INTR AMPL: 6.75 mV
MDC IDC MSMT LEADCHNL RV IMPEDANCE VALUE: 456 Ohm
MDC IDC PG IMPLANT DT: 20171124
MDC IDC SESS DTM: 20190901024651
MDC IDC SET LEADCHNL RA PACING AMPLITUDE: 2.25 V
MDC IDC SET LEADCHNL RV PACING AMPLITUDE: 2.5 V
MDC IDC SET LEADCHNL RV PACING PULSEWIDTH: 0.6 ms
MDC IDC SET LEADCHNL RV SENSING SENSITIVITY: 4 mV
MDC IDC STAT BRADY AS VS PERCENT: 0.05 %
MDC IDC STAT BRADY RV PERCENT PACED: 99.94 %

## 2017-12-26 ENCOUNTER — Encounter: Payer: Self-pay | Admitting: Internal Medicine

## 2018-01-14 ENCOUNTER — Ambulatory Visit: Payer: BC Managed Care – PPO | Admitting: Internal Medicine

## 2018-01-14 ENCOUNTER — Encounter: Payer: Self-pay | Admitting: Internal Medicine

## 2018-01-14 VITALS — BP 124/78 | HR 72 | Ht 66.0 in | Wt 175.0 lb

## 2018-01-14 DIAGNOSIS — I442 Atrioventricular block, complete: Secondary | ICD-10-CM

## 2018-01-14 DIAGNOSIS — Z950 Presence of cardiac pacemaker: Secondary | ICD-10-CM

## 2018-01-14 DIAGNOSIS — I420 Dilated cardiomyopathy: Secondary | ICD-10-CM | POA: Diagnosis not present
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
## 2018-01-14 NOTE — Patient Instructions (Addendum)
Medication Instructions:  Your physician recommends that you continue on your current medications as directed. Please refer to the Current Medication list given to you today.  Labwork: None ordered.  Testing/Procedures: Your physician has requested that you have an echocardiogram. Echocardiography is a painless test that uses sound waves to create images of your heart. It provides your doctor with information about the size and shape of your heart and how well your heart's chambers and valves are working. This procedure takes approximately one hour. There are no restrictions for this procedure.   Follow-Up: Your physician recommends that you schedule a follow-up appointment in:   One Year with Gypsy Balsam, NP  Remote monitoring is used to monitor your Pacemaker of ICD from home. This monitoring reduces the number of office visits required to check your device to one time per year. It allows [place] to keep an eye on the functioning of your device to ensure it is working properly. You are scheduled for a device check from home on 03/04/2018. You may send your transmission at any time that day. If you have a wireless device, the transmission will be sent automatically. After your physician reviews your transmission, you will receive a postcard with your next transmission date.    Any Other Special Instructions Will Be Listed Below (If Applicable).     If you need a refill on your cardiac medications before your next appointment, please call your pharmacy.

## 2018-01-20 LAB — CUP PACEART INCLINIC DEVICE CHECK
Battery Voltage: 2.97 V
Brady Statistic AP VP Percent: 24.94 %
Brady Statistic AS VP Percent: 74.99 %
Brady Statistic AS VS Percent: 0.06 %
Implantable Lead Implant Date: 20130401
Implantable Lead Implant Date: 20171124
Implantable Lead Location: 753858
Implantable Lead Location: 753859
Implantable Lead Model: 5076
Implantable Lead Model: 5076
Lead Channel Impedance Value: 342 Ohm
Lead Channel Impedance Value: 456 Ohm
Lead Channel Impedance Value: 570 Ohm
Lead Channel Impedance Value: 798 Ohm
Lead Channel Impedance Value: 817 Ohm
Lead Channel Impedance Value: 836 Ohm
Lead Channel Pacing Threshold Amplitude: 1 V
Lead Channel Pacing Threshold Amplitude: 1 V
Lead Channel Pacing Threshold Pulse Width: 0.4 ms
Lead Channel Pacing Threshold Pulse Width: 1 ms
Lead Channel Sensing Intrinsic Amplitude: 6.25 mV
Lead Channel Sensing Intrinsic Amplitude: 9.25 mV
Lead Channel Sensing Intrinsic Amplitude: 9.25 mV
Lead Channel Setting Pacing Amplitude: 2.5 V
Lead Channel Setting Pacing Amplitude: 2.5 V
MDC IDC LEAD IMPLANT DT: 20130401
MDC IDC LEAD LOCATION: 753860
MDC IDC MSMT BATTERY REMAINING LONGEVITY: 73 mo
MDC IDC MSMT LEADCHNL LV IMPEDANCE VALUE: 494 Ohm
MDC IDC MSMT LEADCHNL LV IMPEDANCE VALUE: 817 Ohm
MDC IDC MSMT LEADCHNL LV IMPEDANCE VALUE: 817 Ohm
MDC IDC MSMT LEADCHNL RA PACING THRESHOLD AMPLITUDE: 1 V
MDC IDC MSMT LEADCHNL RA SENSING INTR AMPL: 7.375 mV
MDC IDC MSMT LEADCHNL RV IMPEDANCE VALUE: 418 Ohm
MDC IDC MSMT LEADCHNL RV PACING THRESHOLD PULSEWIDTH: 0.6 ms
MDC IDC PG IMPLANT DT: 20171124
MDC IDC SESS DTM: 20191015204150
MDC IDC SET LEADCHNL LV PACING PULSEWIDTH: 1 ms
MDC IDC SET LEADCHNL RA PACING AMPLITUDE: 2.25 V
MDC IDC SET LEADCHNL RV PACING PULSEWIDTH: 0.6 ms
MDC IDC SET LEADCHNL RV SENSING SENSITIVITY: 4 mV
MDC IDC STAT BRADY AP VS PERCENT: 0.01 %
MDC IDC STAT BRADY RA PERCENT PACED: 24.93 %
MDC IDC STAT BRADY RV PERCENT PACED: 99.93 %

## 2018-01-21 ENCOUNTER — Encounter: Payer: Self-pay | Admitting: Internal Medicine

## 2018-01-27 NOTE — Telephone Encounter (Signed)
Completed&signed, copy sent to scan, Original is ready for patient to pick up.

## 2018-02-03 ENCOUNTER — Other Ambulatory Visit: Payer: Self-pay

## 2018-02-03 ENCOUNTER — Ambulatory Visit (HOSPITAL_COMMUNITY): Payer: BC Managed Care – PPO | Attending: Cardiovascular Disease

## 2018-02-03 DIAGNOSIS — Z950 Presence of cardiac pacemaker: Secondary | ICD-10-CM | POA: Insufficient documentation

## 2018-02-03 DIAGNOSIS — I420 Dilated cardiomyopathy: Secondary | ICD-10-CM | POA: Insufficient documentation

## 2018-02-03 DIAGNOSIS — I442 Atrioventricular block, complete: Secondary | ICD-10-CM | POA: Diagnosis present

## 2018-02-03 MED ORDER — PERFLUTREN LIPID MICROSPHERE
1.0000 mL | INTRAVENOUS | Status: AC | PRN
  Administered 2018-02-03: 2 mL via INTRAVENOUS

## 2018-02-17 ENCOUNTER — Encounter: Payer: Self-pay | Admitting: Internal Medicine

## 2018-02-19 ENCOUNTER — Encounter: Payer: Self-pay | Admitting: Internal Medicine

## 2018-03-04 ENCOUNTER — Ambulatory Visit (INDEPENDENT_AMBULATORY_CARE_PROVIDER_SITE_OTHER): Payer: BC Managed Care – PPO

## 2018-03-04 DIAGNOSIS — I442 Atrioventricular block, complete: Secondary | ICD-10-CM | POA: Diagnosis not present

## 2018-03-04 DIAGNOSIS — I420 Dilated cardiomyopathy: Secondary | ICD-10-CM

## 2018-03-04 NOTE — Progress Notes (Signed)
Remote pacemaker transmission.   

## 2018-03-05 ENCOUNTER — Encounter: Payer: Self-pay | Admitting: Internal Medicine

## 2018-03-07 ENCOUNTER — Encounter: Payer: Self-pay | Admitting: Internal Medicine

## 2018-03-10 NOTE — Telephone Encounter (Signed)
Yes, I told him Friday.   I replayed back to him as well.

## 2018-03-11 ENCOUNTER — Encounter: Payer: Self-pay | Admitting: Cardiology

## 2018-04-02 LAB — CUP PACEART REMOTE DEVICE CHECK
Battery Remaining Longevity: 71 mo
Battery Voltage: 2.96 V
Brady Statistic AP VP Percent: 33.42 %
Brady Statistic AP VS Percent: 0.01 %
Brady Statistic AS VS Percent: 0.29 %
Implantable Lead Implant Date: 20171124
Implantable Lead Location: 753859
Implantable Lead Location: 753860
Implantable Lead Model: 4598
Implantable Lead Model: 5076
Implantable Pulse Generator Implant Date: 20171124
Lead Channel Impedance Value: 323 Ohm
Lead Channel Impedance Value: 494 Ohm
Lead Channel Impedance Value: 513 Ohm
Lead Channel Impedance Value: 513 Ohm
Lead Channel Impedance Value: 513 Ohm
Lead Channel Impedance Value: 817 Ohm
Lead Channel Impedance Value: 836 Ohm
Lead Channel Pacing Threshold Amplitude: 0.875 V
Lead Channel Pacing Threshold Amplitude: 1.25 V
Lead Channel Pacing Threshold Pulse Width: 1 ms
Lead Channel Sensing Intrinsic Amplitude: 6.875 mV
Lead Channel Sensing Intrinsic Amplitude: 6.875 mV
Lead Channel Sensing Intrinsic Amplitude: 9.25 mV
Lead Channel Setting Pacing Amplitude: 2.5 V
Lead Channel Setting Pacing Amplitude: 2.5 V
Lead Channel Setting Pacing Pulse Width: 0.6 ms
MDC IDC LEAD IMPLANT DT: 20130401
MDC IDC LEAD IMPLANT DT: 20130401
MDC IDC LEAD LOCATION: 753858
MDC IDC MSMT LEADCHNL LV IMPEDANCE VALUE: 494 Ohm
MDC IDC MSMT LEADCHNL LV IMPEDANCE VALUE: 513 Ohm
MDC IDC MSMT LEADCHNL LV IMPEDANCE VALUE: 817 Ohm
MDC IDC MSMT LEADCHNL LV IMPEDANCE VALUE: 836 Ohm
MDC IDC MSMT LEADCHNL LV IMPEDANCE VALUE: 836 Ohm
MDC IDC MSMT LEADCHNL RA PACING THRESHOLD PULSEWIDTH: 0.4 ms
MDC IDC MSMT LEADCHNL RV IMPEDANCE VALUE: 418 Ohm
MDC IDC MSMT LEADCHNL RV IMPEDANCE VALUE: 456 Ohm
MDC IDC MSMT LEADCHNL RV SENSING INTR AMPL: 9.25 mV
MDC IDC SESS DTM: 20191203024136
MDC IDC SET LEADCHNL LV PACING PULSEWIDTH: 1 ms
MDC IDC SET LEADCHNL RA PACING AMPLITUDE: 2 V
MDC IDC SET LEADCHNL RV SENSING SENSITIVITY: 4 mV
MDC IDC STAT BRADY AS VP PERCENT: 66.28 %
MDC IDC STAT BRADY RA PERCENT PACED: 33.49 %
MDC IDC STAT BRADY RV PERCENT PACED: 99.7 %

## 2018-04-06 ENCOUNTER — Encounter: Payer: Self-pay | Admitting: Internal Medicine

## 2018-05-04 ENCOUNTER — Encounter: Payer: Self-pay | Admitting: Internal Medicine

## 2018-06-01 ENCOUNTER — Encounter: Payer: Self-pay | Admitting: Internal Medicine

## 2018-06-03 ENCOUNTER — Ambulatory Visit (INDEPENDENT_AMBULATORY_CARE_PROVIDER_SITE_OTHER): Payer: BC Managed Care – PPO | Admitting: *Deleted

## 2018-06-03 DIAGNOSIS — I442 Atrioventricular block, complete: Secondary | ICD-10-CM | POA: Diagnosis not present

## 2018-06-03 DIAGNOSIS — Z950 Presence of cardiac pacemaker: Secondary | ICD-10-CM

## 2018-06-04 ENCOUNTER — Telehealth: Payer: Self-pay

## 2018-06-04 NOTE — Telephone Encounter (Signed)
Left message for patient to remind of missed remote transmission.  

## 2018-06-07 LAB — CUP PACEART REMOTE DEVICE CHECK
Brady Statistic AP VP Percent: 18.59 %
Brady Statistic AP VS Percent: 0.01 %
Brady Statistic AS VP Percent: 81.35 %
Brady Statistic RA Percent Paced: 18.57 %
Date Time Interrogation Session: 20200306033720
Implantable Lead Implant Date: 20171124
Implantable Lead Location: 753859
Implantable Lead Location: 753860
Implantable Lead Model: 5076
Implantable Lead Model: 5076
Implantable Pulse Generator Implant Date: 20171124
Lead Channel Impedance Value: 437 Ohm
Lead Channel Impedance Value: 475 Ohm
Lead Channel Impedance Value: 513 Ohm
Lead Channel Impedance Value: 513 Ohm
Lead Channel Impedance Value: 798 Ohm
Lead Channel Impedance Value: 836 Ohm
Lead Channel Impedance Value: 836 Ohm
Lead Channel Sensing Intrinsic Amplitude: 6.25 mV
Lead Channel Sensing Intrinsic Amplitude: 9.25 mV
Lead Channel Sensing Intrinsic Amplitude: 9.25 mV
Lead Channel Setting Pacing Amplitude: 2.5 V
Lead Channel Setting Pacing Amplitude: 2.5 V
MDC IDC LEAD IMPLANT DT: 20130401
MDC IDC LEAD IMPLANT DT: 20130401
MDC IDC LEAD LOCATION: 753858
MDC IDC MSMT BATTERY REMAINING LONGEVITY: 67 mo
MDC IDC MSMT BATTERY VOLTAGE: 2.96 V
MDC IDC MSMT LEADCHNL LV IMPEDANCE VALUE: 475 Ohm
MDC IDC MSMT LEADCHNL LV IMPEDANCE VALUE: 513 Ohm
MDC IDC MSMT LEADCHNL LV IMPEDANCE VALUE: 513 Ohm
MDC IDC MSMT LEADCHNL LV IMPEDANCE VALUE: 817 Ohm
MDC IDC MSMT LEADCHNL LV IMPEDANCE VALUE: 836 Ohm
MDC IDC MSMT LEADCHNL LV PACING THRESHOLD AMPLITUDE: 1.125 V
MDC IDC MSMT LEADCHNL LV PACING THRESHOLD PULSEWIDTH: 1 ms
MDC IDC MSMT LEADCHNL RA IMPEDANCE VALUE: 342 Ohm
MDC IDC MSMT LEADCHNL RA IMPEDANCE VALUE: 437 Ohm
MDC IDC MSMT LEADCHNL RA PACING THRESHOLD AMPLITUDE: 0.75 V
MDC IDC MSMT LEADCHNL RA PACING THRESHOLD PULSEWIDTH: 0.4 ms
MDC IDC MSMT LEADCHNL RA SENSING INTR AMPL: 6.25 mV
MDC IDC SET LEADCHNL LV PACING PULSEWIDTH: 1 ms
MDC IDC SET LEADCHNL RA PACING AMPLITUDE: 1.5 V
MDC IDC SET LEADCHNL RV PACING PULSEWIDTH: 0.6 ms
MDC IDC SET LEADCHNL RV SENSING SENSITIVITY: 4 mV
MDC IDC STAT BRADY AS VS PERCENT: 0.05 %
MDC IDC STAT BRADY RV PERCENT PACED: 99.94 %

## 2018-06-11 ENCOUNTER — Encounter: Payer: Self-pay | Admitting: Cardiology

## 2018-06-11 NOTE — Progress Notes (Signed)
Remote pacemaker transmission.   

## 2018-07-01 ENCOUNTER — Encounter: Payer: Self-pay | Admitting: Internal Medicine

## 2018-07-03 ENCOUNTER — Encounter: Payer: Self-pay | Admitting: Internal Medicine

## 2018-07-04 ENCOUNTER — Encounter: Payer: Self-pay | Admitting: Internal Medicine

## 2018-07-04 ENCOUNTER — Ambulatory Visit (INDEPENDENT_AMBULATORY_CARE_PROVIDER_SITE_OTHER): Payer: BC Managed Care – PPO | Admitting: Internal Medicine

## 2018-07-04 DIAGNOSIS — R600 Localized edema: Secondary | ICD-10-CM | POA: Diagnosis not present

## 2018-07-04 DIAGNOSIS — L0100 Impetigo, unspecified: Secondary | ICD-10-CM | POA: Diagnosis not present

## 2018-07-04 MED ORDER — FUROSEMIDE 20 MG PO TABS: 20.0000 mg | ORAL_TABLET | Freq: Every day | ORAL | 5 refills | Status: DC

## 2018-07-04 MED ORDER — MUPIROCIN 2 % EX OINT: 1.0000 "application " | TOPICAL_OINTMENT | Freq: Two times a day (BID) | CUTANEOUS | 0 refills | Status: DC

## 2018-07-04 NOTE — Progress Notes (Signed)
Virtual Visit via Video Note  I connected with William Hartman on 07/04/18 at  1:30 PM EDT by a video enabled telemedicine application and verified that I am speaking with the correct person using two identifiers.   I discussed the limitations of evaluation and management by telemedicine and the availability of in person appointments. The patient expressed understanding and agreed to proceed.  The patient is currently at home and I am in the office.    No referring provider.    History of Present Illness: This visit is for an acute problem-skin changes right lower leg and right leg swelling.  And his wife noticed a couple of weeks ago that his right leg was getting more swollen and there were some increased redness/pain changes.  They have not gotten better and may have gotten slightly worse since they started.  He has pain with standing and the foot feels tight.  He denies any pain when he sitting and it is nontender to touch.  He did send pictures via MyChart of his right lower leg-anterior and posterior pictures.  He has some increased redness in the front and the back he has an area of redness with what appears like a yellow crusting in the middle.  The right leg is slightly warmer than the left.  He was concerned about the possibility of infection.  He is able to elevate his legs, but only 20 minutes at a time.  He does not walk around much because it causes hip pain.  He is in a wheelchair most of the day.  He has tried applying alcohol and hydrogen peroxide to the leg and they have been soaking his leg in Epson salt soaks with tea tree oil.  They have also tried Santyl cream because they thought this was an antibiotic cream.  There is an injury on his right posterior heel that intermittently hurts.  It looks like a shallow ulcer.  He states this is where his foot rests when he is in bed.   Observations/Objective: Appears well, in no acute distress  He sent to pictures via my chart  earlier today, which I did review.  Both pictures are on his right lower leg-anterior and posterior  Assessment and Plan:  See Problem List for Assessment and Plan of chronic medical problems.    Follow Up Instructions:    I discussed the assessment and treatment plan with the patient. The patient was provided an opportunity to ask questions and all were answered. The patient agreed with the plan and demonstrated an understanding of the instructions.   The patient was advised to call back or seek an in-person evaluation if the symptoms worsen or if the condition fails to improve as anticipated.     Pincus Sanes, MD

## 2018-07-04 NOTE — Assessment & Plan Note (Signed)
He has an area on his right posterior lower leg that has yellow crusting that could be impetigo He does have increased swelling in the leg and has been soaking it Start Bactroban If there is no improvement will prescribe an oral antibiotic-he will update me next week

## 2018-07-04 NOTE — Assessment & Plan Note (Signed)
He has bilateral leg swelling-right leg is worse than the left.  Increased swelling over the past couple of weeks with increased skin changes that appear to be chronic venous stasis in nature On the posterior right leg he does have an area of yellow crusting that could be impetigo and a small stage I pressure ulcer on his heel Advised to offload the pressure on his leg Elevate legs when possible and move around as much as possible He has tried compression socks and they are difficult to get on-can retry this and maybe consider trying a low grade compression, which would be better than nothing Will start Lasix 20 mg daily-most likely he will not need this chronically and hopefully will only need as needed periodically He will update me online if the swelling improves after a few days Start Bactroban to the posterior right lower leg where there is yellow crusting-possible impetigo-he states this area is wet Can try triamcinolone cream to the chronic venous stasis skin changes  Call/MyChart if no improvement or any questions.

## 2018-07-11 ENCOUNTER — Encounter: Payer: Self-pay | Admitting: Internal Medicine

## 2018-08-03 ENCOUNTER — Encounter: Payer: Self-pay | Admitting: Internal Medicine

## 2018-09-01 ENCOUNTER — Encounter: Payer: Self-pay | Admitting: Internal Medicine

## 2018-09-02 ENCOUNTER — Ambulatory Visit (INDEPENDENT_AMBULATORY_CARE_PROVIDER_SITE_OTHER): Payer: BC Managed Care – PPO | Admitting: *Deleted

## 2018-09-02 DIAGNOSIS — I442 Atrioventricular block, complete: Secondary | ICD-10-CM

## 2018-09-02 DIAGNOSIS — I420 Dilated cardiomyopathy: Secondary | ICD-10-CM

## 2018-09-03 ENCOUNTER — Telehealth: Payer: Self-pay

## 2018-09-03 NOTE — Telephone Encounter (Signed)
Left message for patient to remind of missed remote transmission.  

## 2018-09-04 LAB — CUP PACEART REMOTE DEVICE CHECK
Battery Remaining Longevity: 61 mo
Battery Voltage: 2.95 V
Brady Statistic AP VP Percent: 5.13 %
Brady Statistic AP VS Percent: 0.01 %
Brady Statistic AS VP Percent: 94.86 %
Brady Statistic AS VS Percent: 0.01 %
Brady Statistic RA Percent Paced: 5.1 %
Brady Statistic RV Percent Paced: 99.98 %
Date Time Interrogation Session: 20200603222006
Implantable Lead Implant Date: 20130401
Implantable Lead Implant Date: 20130401
Implantable Lead Implant Date: 20171124
Implantable Lead Location: 753858
Implantable Lead Location: 753859
Implantable Lead Location: 753860
Implantable Lead Model: 4598
Implantable Lead Model: 5076
Implantable Lead Model: 5076
Implantable Pulse Generator Implant Date: 20171124
Lead Channel Impedance Value: 342 Ohm
Lead Channel Impedance Value: 418 Ohm
Lead Channel Impedance Value: 456 Ohm
Lead Channel Impedance Value: 456 Ohm
Lead Channel Impedance Value: 456 Ohm
Lead Channel Impedance Value: 494 Ohm
Lead Channel Impedance Value: 494 Ohm
Lead Channel Impedance Value: 513 Ohm
Lead Channel Impedance Value: 513 Ohm
Lead Channel Impedance Value: 779 Ohm
Lead Channel Impedance Value: 779 Ohm
Lead Channel Impedance Value: 817 Ohm
Lead Channel Impedance Value: 817 Ohm
Lead Channel Impedance Value: 817 Ohm
Lead Channel Pacing Threshold Amplitude: 0.75 V
Lead Channel Pacing Threshold Amplitude: 1.375 V
Lead Channel Pacing Threshold Pulse Width: 0.4 ms
Lead Channel Pacing Threshold Pulse Width: 1 ms
Lead Channel Sensing Intrinsic Amplitude: 6.375 mV
Lead Channel Sensing Intrinsic Amplitude: 6.375 mV
Lead Channel Sensing Intrinsic Amplitude: 9.25 mV
Lead Channel Sensing Intrinsic Amplitude: 9.25 mV
Lead Channel Setting Pacing Amplitude: 1.5 V
Lead Channel Setting Pacing Amplitude: 2.5 V
Lead Channel Setting Pacing Amplitude: 2.5 V
Lead Channel Setting Pacing Pulse Width: 0.6 ms
Lead Channel Setting Pacing Pulse Width: 1 ms
Lead Channel Setting Sensing Sensitivity: 4 mV

## 2018-09-10 ENCOUNTER — Encounter: Payer: Self-pay | Admitting: Cardiology

## 2018-09-10 NOTE — Progress Notes (Signed)
Remote pacemaker transmission.   

## 2018-09-30 ENCOUNTER — Encounter: Payer: Self-pay | Admitting: Internal Medicine

## 2018-10-12 ENCOUNTER — Other Ambulatory Visit: Payer: Self-pay | Admitting: Internal Medicine

## 2018-10-30 ENCOUNTER — Encounter: Payer: Self-pay | Admitting: Internal Medicine

## 2018-10-31 ENCOUNTER — Encounter: Payer: Self-pay | Admitting: Internal Medicine

## 2018-10-31 ENCOUNTER — Ambulatory Visit (INDEPENDENT_AMBULATORY_CARE_PROVIDER_SITE_OTHER): Payer: BC Managed Care – PPO | Admitting: Internal Medicine

## 2018-10-31 DIAGNOSIS — R600 Localized edema: Secondary | ICD-10-CM

## 2018-10-31 DIAGNOSIS — E782 Mixed hyperlipidemia: Secondary | ICD-10-CM

## 2018-10-31 DIAGNOSIS — I10 Essential (primary) hypertension: Secondary | ICD-10-CM

## 2018-10-31 DIAGNOSIS — L97411 Non-pressure chronic ulcer of right heel and midfoot limited to breakdown of skin: Secondary | ICD-10-CM | POA: Insufficient documentation

## 2018-10-31 MED ORDER — CARVEDILOL 3.125 MG PO TABS: 3.1250 mg | ORAL_TABLET | Freq: Two times a day (BID) | ORAL | 1 refills | Status: DC

## 2018-10-31 MED ORDER — ATORVASTATIN CALCIUM 20 MG PO TABS: 20.0000 mg | ORAL_TABLET | Freq: Every day | ORAL | 1 refills | Status: DC

## 2018-10-31 NOTE — Assessment & Plan Note (Signed)
Likely pressure ulcer Stage II because there is some loss of skin Wearing a sock with cushion at night has helped and encouraged him to wear this long-term see if this helps heal it and prevent it Most likely it is occurring at night from prolonged pressure Monitor closely Can apply a barrier cream

## 2018-10-31 NOTE — Progress Notes (Signed)
Virtual Visit via Video Note  I connected with William Hartman on 10/31/18 at 11:00 AM EDT by a video enabled telemedicine application and verified that I am speaking with the correct person using two identifiers.   I discussed the limitations of evaluation and management by telemedicine and the availability of in person appointments. The patient expressed understanding and agreed to proceed.  The patient is currently at home and I am in the office.    No referring provider.    History of Present Illness: This is an acute visit for right lower leg swelling and right heel ulcer.  We will also follow-up on his chronic medical problems.  Right heel ulcer: Since the summer started he has had a recurrent right heel ulcer.  It is only painful when it is touched, but he has no pain without touching it or with walking.  The skin will peel off in that area and over time it will heal, but nothing comes back.  He tries to occasionally sleep in a different position because putting pressure on it hurts, but when he does that he puts more pressure on his hip and he cannot tolerate that.  He has worn a soft his wife made the has some cushioning in it and that seems to help-he has worn it at night.  He is unsure why the ulcer tends to come back.  He denies any pressure on the heel during the day.  It is not open and there is no discharge.  He did send a picture through my chart.  Right lower extremity swelling: His right leg is more swollen than the left.  The left is "normal".  He states it has been that way for a while.  He denies any pain.  There is some chronic skin changes which are not new.  He has taken Lasix in the past, but has not taken it for a while because once his legs got to baseline it did not seem to be doing anything.  He does elevate his legs.  He denies shortness of breath.  He is exercising twice a week, but this is not as much as he was exercising previously and he feels it is taking a toll on  his health.     Hypertension: He is taking his medication daily. He is compliant with a low sodium diet.  He denies chest pain, palpitations, shortness of breath and regular headaches.      Hyperlipidemia: He is taking his medication daily. He is compliant with a low fat/cholesterol diet. He denies myalgias.   Review of Systems  Constitutional: Negative for chills and fever.  Respiratory: Negative for cough, shortness of breath and wheezing.   Cardiovascular: Positive for leg swelling. Negative for chest pain and palpitations.  Neurological: Positive for dizziness (Occasional). Negative for headaches.     Social History   Socioeconomic History  . Marital status: Married    Spouse name: Not on file  . Number of children: Not on file  . Years of education: Not on file  . Highest education level: Not on file  Occupational History  . Not on file  Social Needs  . Financial resource strain: Not on file  . Food insecurity    Worry: Not on file    Inability: Not on file  . Transportation needs    Medical: Not on file    Non-medical: Not on file  Tobacco Use  . Smoking status: Never Smoker  . Smokeless tobacco:  Never Used  Substance and Sexual Activity  . Alcohol use: Yes    Alcohol/week: 1.0 standard drinks    Types: 1 Glasses of wine per week    Comment: "not much"  . Drug use: No  . Sexual activity: Yes    Comment: He lives in [place] with his wife. 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 He has limited mobility, but is able to walk for routine things if needs to be up for long while, uses a wheelchair or scooter  Lifestyle  . Physical activity    Days per week: Not on file    Minutes per session: Not on file  . Stress: Not on file  Relationships  . Social Herbalist on phone: Not on file    Gets together: Not on file    Attends religious service: Not on file    Active member of club or organization: Not on file    Attends  meetings of clubs or organizations: Not on file    Relationship status: Not on file  Other Topics Concern  . Not on file  Social History Narrative  . Not on file     Observations/Objective: Appears well in NAD   Assessment and Plan:  See Problem List for Assessment and Plan of chronic medical problems.   Follow Up Instructions:    I discussed the assessment and treatment plan with the patient. The patient was provided an opportunity to ask questions and all were answered. The patient agreed with the plan and demonstrated an understanding of the instructions.   The patient was advised to call back or seek an in-person evaluation if the symptoms worsen or if the condition fails to improve as anticipated.    Binnie Rail, MD

## 2018-10-31 NOTE — Assessment & Plan Note (Signed)
Chronic bilateral lower extremity swelling-right leg is worse than left Skin changes consistent with chronic venous stasis Trial of Lasix 40 mg x 1 to see if this helps decrease some of the swelling.  Discussed with him that he may need to take Lasix intermittently to keep the swelling down Continue to elevate legs when able Unable to do compression socks-too difficult to get on

## 2018-10-31 NOTE — Assessment & Plan Note (Signed)
BP Readings from Last 3 Encounters:  01/14/18 124/78  11/25/17 128/74  01/14/17 (!) 132/94   Blood pressure has been controlled Continue current dose of medication Refilled today

## 2018-10-31 NOTE — Assessment & Plan Note (Signed)
Continue atorvastatin Will refill

## 2018-10-31 NOTE — Telephone Encounter (Signed)
Scheduled

## 2018-11-01 ENCOUNTER — Encounter: Payer: Self-pay | Admitting: Internal Medicine

## 2018-11-21 ENCOUNTER — Encounter: Payer: Self-pay | Admitting: Internal Medicine

## 2018-11-25 ENCOUNTER — Encounter: Payer: Self-pay | Admitting: Internal Medicine
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
## 2018-12-02 ENCOUNTER — Other Ambulatory Visit: Payer: Self-pay

## 2018-12-02 ENCOUNTER — Encounter: Payer: Self-pay | Admitting: Internal Medicine

## 2018-12-02 ENCOUNTER — Other Ambulatory Visit (INDEPENDENT_AMBULATORY_CARE_PROVIDER_SITE_OTHER): Payer: BC Managed Care – PPO

## 2018-12-02 ENCOUNTER — Ambulatory Visit (INDEPENDENT_AMBULATORY_CARE_PROVIDER_SITE_OTHER): Payer: BC Managed Care – PPO | Admitting: *Deleted

## 2018-12-02 ENCOUNTER — Ambulatory Visit (INDEPENDENT_AMBULATORY_CARE_PROVIDER_SITE_OTHER): Payer: BC Managed Care – PPO | Admitting: Internal Medicine

## 2018-12-02 VITALS — BP 138/82 | HR 75 | Temp 98.6°F | Resp 16

## 2018-12-02 DIAGNOSIS — I420 Dilated cardiomyopathy: Secondary | ICD-10-CM

## 2018-12-02 DIAGNOSIS — E782 Mixed hyperlipidemia: Secondary | ICD-10-CM

## 2018-12-02 DIAGNOSIS — I442 Atrioventricular block, complete: Secondary | ICD-10-CM

## 2018-12-02 DIAGNOSIS — R600 Localized edema: Secondary | ICD-10-CM | POA: Diagnosis not present

## 2018-12-02 DIAGNOSIS — I10 Essential (primary) hypertension: Secondary | ICD-10-CM | POA: Diagnosis not present

## 2018-12-02 DIAGNOSIS — Q743 Arthrogryposis multiplex congenita: Secondary | ICD-10-CM

## 2018-12-02 LAB — CBC WITH DIFFERENTIAL/PLATELET
Basophils Absolute: 0.1 10*3/uL (ref 0.0–0.1)
Basophils Relative: 0.6 % (ref 0.0–3.0)
Eosinophils Absolute: 0.3 10*3/uL (ref 0.0–0.7)
Eosinophils Relative: 3 % (ref 0.0–5.0)
HCT: 44.9 % (ref 39.0–52.0)
Hemoglobin: 15.2 g/dL (ref 13.0–17.0)
Lymphocytes Relative: 29.4 % (ref 12.0–46.0)
Lymphs Abs: 2.8 10*3/uL (ref 0.7–4.0)
MCHC: 33.9 g/dL (ref 30.0–36.0)
MCV: 92 fl (ref 78.0–100.0)
Monocytes Absolute: 0.8 10*3/uL (ref 0.1–1.0)
Monocytes Relative: 8 % (ref 3.0–12.0)
Neutro Abs: 5.7 10*3/uL (ref 1.4–7.7)
Neutrophils Relative %: 59 % (ref 43.0–77.0)
Platelets: 301 10*3/uL (ref 150.0–400.0)
RBC: 4.88 Mil/uL (ref 4.22–5.81)
RDW: 13.9 % (ref 11.5–15.5)
WBC: 9.6 10*3/uL (ref 4.0–10.5)

## 2018-12-02 LAB — LIPID PANEL
Cholesterol: 136 mg/dL (ref 0–200)
HDL: 37.8 mg/dL — ABNORMAL LOW (ref >39.00)
NonHDL: 98.4
Total CHOL/HDL Ratio: 4
Triglycerides: 228 mg/dL — ABNORMAL HIGH (ref 0.0–149.0)
VLDL: 45.6 mg/dL — ABNORMAL HIGH (ref 0.0–40.0)

## 2018-12-02 LAB — TSH: TSH: 1.02 u[IU]/mL (ref 0.35–4.50)

## 2018-12-02 LAB — COMPREHENSIVE METABOLIC PANEL
ALT: 30 U/L (ref 0–53)
AST: 18 U/L (ref 0–37)
Albumin: 4.3 g/dL (ref 3.5–5.2)
Alkaline Phosphatase: 75 U/L (ref 39–117)
BUN: 20 mg/dL (ref 6–23)
CO2: 25 mEq/L (ref 19–32)
Calcium: 9.6 mg/dL (ref 8.4–10.5)
Chloride: 103 mEq/L (ref 96–112)
Creatinine, Ser: 0.55 mg/dL (ref 0.40–1.50)
GFR: 156.34 mL/min (ref >60.00)
Glucose, Bld: 99 mg/dL (ref 70–99)
Potassium: 4 mEq/L (ref 3.5–5.1)
Sodium: 139 mEq/L (ref 135–145)
Total Bilirubin: 0.5 mg/dL (ref 0.2–1.2)
Total Protein: 8.1 g/dL (ref 6.0–8.3)

## 2018-12-02 LAB — LDL CHOLESTEROL, DIRECT: Direct LDL: 77 mg/dL

## 2018-12-02 NOTE — Assessment & Plan Note (Signed)
Congenital disease resulting in joint contractures, joint pain, decreased flexibility and range of motion of his joints Overall symptoms have worsened over the years He is not physically able to work and needs to be on permanent disability

## 2018-12-02 NOTE — Patient Instructions (Addendum)
  Tests ordered today. Your results will be released to MyChart (or called to you) after review.  If any changes need to be made, you will be notified at that same time.    Medications reviewed and updated.  Changes include :   none     Please followup in 6 months   

## 2018-12-02 NOTE — Assessment & Plan Note (Signed)
Controlled, stable Continue furosemide as needed CMP

## 2018-12-02 NOTE — Assessment & Plan Note (Signed)
Blood pressure controlled Continue current medication at current doses CMP, CBC

## 2018-12-04 ENCOUNTER — Encounter: Payer: Self-pay | Admitting: Internal Medicine

## 2018-12-04 LAB — CUP PACEART REMOTE DEVICE CHECK
Battery Remaining Longevity: 56 mo
Battery Voltage: 2.95 V
Brady Statistic AP VP Percent: 10.45 %
Brady Statistic AP VS Percent: 0.01 %
Brady Statistic AS VP Percent: 89.53 %
Brady Statistic AS VS Percent: 0.01 %
Brady Statistic RA Percent Paced: 10.42 %
Brady Statistic RV Percent Paced: 99.98 %
Date Time Interrogation Session: 20200902165351
Implantable Lead Implant Date: 20130401
Implantable Lead Implant Date: 20130401
Implantable Lead Implant Date: 20171124
Implantable Lead Location: 753858
Implantable Lead Location: 753859
Implantable Lead Location: 753860
Implantable Lead Model: 4598
Implantable Lead Model: 5076
Implantable Lead Model: 5076
Implantable Pulse Generator Implant Date: 20171124
Lead Channel Impedance Value: 342 Ohm
Lead Channel Impedance Value: 437 Ohm
Lead Channel Impedance Value: 456 Ohm
Lead Channel Impedance Value: 494 Ohm
Lead Channel Impedance Value: 513 Ohm
Lead Channel Impedance Value: 513 Ohm
Lead Channel Impedance Value: 532 Ohm
Lead Channel Impedance Value: 532 Ohm
Lead Channel Impedance Value: 532 Ohm
Lead Channel Impedance Value: 817 Ohm
Lead Channel Impedance Value: 855 Ohm
Lead Channel Impedance Value: 855 Ohm
Lead Channel Impedance Value: 874 Ohm
Lead Channel Impedance Value: 874 Ohm
Lead Channel Pacing Threshold Amplitude: 0.875 V
Lead Channel Pacing Threshold Amplitude: 1.25 V
Lead Channel Pacing Threshold Pulse Width: 0.4 ms
Lead Channel Pacing Threshold Pulse Width: 1 ms
Lead Channel Sensing Intrinsic Amplitude: 5.625 mV
Lead Channel Sensing Intrinsic Amplitude: 5.625 mV
Lead Channel Sensing Intrinsic Amplitude: 9.25 mV
Lead Channel Sensing Intrinsic Amplitude: 9.25 mV
Lead Channel Setting Pacing Amplitude: 1.75 V
Lead Channel Setting Pacing Amplitude: 2.5 V
Lead Channel Setting Pacing Amplitude: 2.5 V
Lead Channel Setting Pacing Pulse Width: 0.6 ms
Lead Channel Setting Pacing Pulse Width: 1 ms
Lead Channel Setting Sensing Sensitivity: 4 mV

## 2018-12-11 ENCOUNTER — Telehealth: Payer: Self-pay

## 2018-12-11 NOTE — Telephone Encounter (Signed)
Done

## 2018-12-11 NOTE — Telephone Encounter (Signed)
-----   Message from Binnie Rail, MD sent at 12/02/2018  2:08 PM EDT ----- Please call cologuard sent to him

## 2018-12-15 ENCOUNTER — Encounter: Payer: Self-pay | Admitting: Internal Medicine

## 2018-12-16 NOTE — Progress Notes (Signed)
Remote pacemaker transmission.   

## 2018-12-26 LAB — COLOGUARD: Cologuard: NEGATIVE

## 2019-01-01 ENCOUNTER — Encounter: Payer: Self-pay | Admitting: Internal Medicine

## 2019-01-01 DIAGNOSIS — Z0279 Encounter for issue of other medical certificate: Secondary | ICD-10-CM

## 2019-01-03 ENCOUNTER — Encounter: Payer: Self-pay | Admitting: Internal Medicine

## 2019-01-05 ENCOUNTER — Encounter: Payer: Self-pay | Admitting: Internal Medicine

## 2019-02-01 ENCOUNTER — Encounter: Payer: Self-pay | Admitting: Internal Medicine

## 2019-03-09 ENCOUNTER — Telehealth: Payer: Self-pay | Admitting: Emergency Medicine

## 2019-03-09 NOTE — Telephone Encounter (Signed)
Called to report he now has insurance and would like to send remote transmission now instead of waiting. OK to send.

## 2019-03-10 ENCOUNTER — Ambulatory Visit (INDEPENDENT_AMBULATORY_CARE_PROVIDER_SITE_OTHER): Payer: BC Managed Care – PPO | Admitting: *Deleted

## 2019-03-10 DIAGNOSIS — I442 Atrioventricular block, complete: Secondary | ICD-10-CM

## 2019-03-10 LAB — CUP PACEART REMOTE DEVICE CHECK
Date Time Interrogation Session: 20201208105838
Implantable Lead Implant Date: 20130401
Implantable Lead Implant Date: 20130401
Implantable Lead Implant Date: 20171124
Implantable Lead Location: 753858
Implantable Lead Location: 753859
Implantable Lead Location: 753860
Implantable Lead Model: 4598
Implantable Lead Model: 5076
Implantable Lead Model: 5076
Implantable Pulse Generator Implant Date: 20171124

## 2019-03-13 ENCOUNTER — Encounter: Payer: Self-pay | Admitting: Internal Medicine

## 2019-03-13 DIAGNOSIS — Q743 Arthrogryposis multiplex congenita: Secondary | ICD-10-CM

## 2019-03-17 NOTE — Telephone Encounter (Signed)
Patient calling [facility] and wanted to know if he needs to go ahead and schedule or wait and see what Dr Quay Burow said. Please advise.

## 2019-04-14 NOTE — Progress Notes (Signed)
PPM remote 

## 2019-05-26 ENCOUNTER — Encounter: Payer: Self-pay | Admitting: Internal Medicine

## 2019-06-01 ENCOUNTER — Encounter: Payer: Self-pay | Admitting: Internal Medicine

## 2019-06-01 NOTE — Progress Notes (Signed)
Virtual Visit via Video Note  I connected with William Hartman on 06/02/19 at  1:00 PM EST by a video enabled telemedicine application and verified that I am speaking with the correct person using two identifiers.   I discussed the limitations of evaluation and management by telemedicine and the availability of in person appointments. The patient expressed understanding and agreed to proceed.  Present for the visit:  Myself, Dr Billey Gosling, Elio Forget.  The patient is currently at home and I am in the office.    No referring provider.    History of Present Illness: He is here for follow up of his chronic medical conditions.     He is taking all of his medications as prescribed.    Hypertension, b/l leg edema: He is taking his medication daily. He is compliant with a low sodium diet.   He does not monitor his blood pressure at home regularly, but will make sure he checks it today to make sure it is still okay.    Hyperlipidemia: He is taking his medication daily. He is compliant with a low fat/cholesterol diet. He denies myalgias.   Arthrogryposis multiplex congenita:  He is applying for disability and that is almost finalized.  He has joint pain.  He is doing some exercise.  At some point he would like to do some physical therapy.  He feels less active because of Covid and because he is not going to work on a regular basis.  Leg swelling:  He is not taking his lasix. He has swelling which he feels is from inactivity.    Review of Systems  Constitutional: Negative for chills and fever.  Respiratory: Positive for cough. Negative for shortness of breath and wheezing.   Cardiovascular: Positive for leg swelling. Negative for chest pain and palpitations.  Neurological: Positive for dizziness (occ). Negative for headaches.     Social History   Socioeconomic History  . Marital status: Married    Spouse name: Not on file  . Number of children: Not on file  . Years of education: Not on  file  . Highest education level: Not on file  Occupational History  . Not on file  Tobacco Use  . Smoking status: Never Smoker  . Smokeless tobacco: Never Used  Substance and Sexual Activity  . Alcohol use: Yes    Alcohol/week: 1.0 standard drinks    Types: 1 Glasses of wine per week    Comment: "not much"  . Drug use: No  . Sexual activity: Yes    Comment: He lives in [place] with his wife. 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 He has limited mobility, but is able to walk for routine things if needs to be up for long while, uses a wheelchair or scooter  Other Topics Concern  . Not on file  Social History Narrative  . Not on file   Social Determinants of Health   Financial Resource Strain:   . Difficulty of Paying Living Expenses: Not on file  Food Insecurity:   . Worried About Charity fundraiser in the Last Year: Not on file  . Ran Out of Food in the Last Year: Not on file  Transportation Needs:   . Lack of Transportation (Medical): Not on file  . Lack of Transportation (Non-Medical): Not on file  Physical Activity:   . Days of Exercise per Week: Not on file  . Minutes of Exercise per Session: Not on file  Stress:   . Feeling of Stress : Not on file  Social Connections:   . Frequency of Communication with Friends and Family: Not on file  . Frequency of Social Gatherings with Friends and Family: Not on file  . Attends Religious Services: Not on file  . Active Member of Clubs or Organizations: Not on file  . Attends Banker Meetings: Not on file  . Marital Status: Not on file     Observations/Objective: Appears well in NAD Breathing normally Skin appears warm and dry  Assessment and Plan:  See Problem List for Assessment and Plan of chronic medical problems.   Follow Up Instructions:    I discussed the assessment and treatment plan with the patient. The patient was provided an opportunity to ask questions and  all were answered. The patient agreed with the plan and demonstrated an understanding of the instructions.   The patient was advised to call back or seek an in-person evaluation if the symptoms worsen or if the condition fails to improve as anticipated.  6 months  Pincus Sanes, MD

## 2019-06-02 ENCOUNTER — Ambulatory Visit: Payer: BC Managed Care – PPO | Admitting: Internal Medicine

## 2019-06-02 ENCOUNTER — Encounter: Payer: Self-pay | Admitting: Internal Medicine

## 2019-06-02 ENCOUNTER — Ambulatory Visit (INDEPENDENT_AMBULATORY_CARE_PROVIDER_SITE_OTHER): Payer: 59 | Admitting: Internal Medicine

## 2019-06-02 DIAGNOSIS — Q743 Arthrogryposis multiplex congenita: Secondary | ICD-10-CM

## 2019-06-02 DIAGNOSIS — E782 Mixed hyperlipidemia: Secondary | ICD-10-CM | POA: Diagnosis not present

## 2019-06-02 DIAGNOSIS — I10 Essential (primary) hypertension: Secondary | ICD-10-CM | POA: Diagnosis not present

## 2019-06-02 DIAGNOSIS — R600 Localized edema: Secondary | ICD-10-CM

## 2019-06-02 MED ORDER — FUROSEMIDE 20 MG PO TABS: 20.0000 mg | ORAL_TABLET | Freq: Every day | ORAL | 5 refills | Status: DC | PRN

## 2019-06-02 NOTE — Assessment & Plan Note (Signed)
Chronic Taking blood pressure medication daily as prescribed He will monitor his blood pressure at home-he does not check it regularly, but will check it intermittently to make sure it is well controlled and let me know otherwise Continue current medication at current dose Follow-up in 6 months

## 2019-06-02 NOTE — Assessment & Plan Note (Signed)
Chronic, congenital in nature He is not working regularly and has applied for disability, which is going through He feels less active secondary to Covid and not working on a regular basis and feels he would benefit from physical therapy and I agree Once he feels comfortable because of the Covid pandemic he will let me know and I will refer him Encouraged him to be as active as possible

## 2019-06-02 NOTE — Assessment & Plan Note (Signed)
Chronic Continue atorvastatin 20 mg daily 

## 2019-06-02 NOTE — Assessment & Plan Note (Signed)
Chronic Mild currently Occasionally his swelling does increase Advised that he can take the Lasix as needed-he did forget that he had this, but does have some at home

## 2019-06-09 ENCOUNTER — Ambulatory Visit (INDEPENDENT_AMBULATORY_CARE_PROVIDER_SITE_OTHER): Payer: 59 | Admitting: *Deleted

## 2019-06-09 DIAGNOSIS — I442 Atrioventricular block, complete: Secondary | ICD-10-CM | POA: Diagnosis not present

## 2019-06-10 ENCOUNTER — Telehealth: Payer: Self-pay

## 2019-06-10 LAB — CUP PACEART REMOTE DEVICE CHECK
Battery Remaining Longevity: 43 mo
Battery Voltage: 2.94 V
Brady Statistic AP VP Percent: 28.01 %
Brady Statistic AP VS Percent: 0.01 %
Brady Statistic AS VP Percent: 71.38 %
Brady Statistic AS VS Percent: 0.61 %
Brady Statistic RA Percent Paced: 28.31 %
Brady Statistic RV Percent Paced: 99.38 %
Date Time Interrogation Session: 20210310132327
Implantable Lead Implant Date: 20130401
Implantable Lead Implant Date: 20130401
Implantable Lead Implant Date: 20171124
Implantable Lead Location: 753858
Implantable Lead Location: 753859
Implantable Lead Location: 753860
Implantable Lead Model: 4598
Implantable Lead Model: 5076
Implantable Lead Model: 5076
Implantable Pulse Generator Implant Date: 20171124
Lead Channel Impedance Value: 323 Ohm
Lead Channel Impedance Value: 437 Ohm
Lead Channel Impedance Value: 437 Ohm
Lead Channel Impedance Value: 456 Ohm
Lead Channel Impedance Value: 494 Ohm
Lead Channel Impedance Value: 513 Ohm
Lead Channel Impedance Value: 532 Ohm
Lead Channel Impedance Value: 570 Ohm
Lead Channel Impedance Value: 570 Ohm
Lead Channel Impedance Value: 836 Ohm
Lead Channel Impedance Value: 855 Ohm
Lead Channel Impedance Value: 874 Ohm
Lead Channel Impedance Value: 893 Ohm
Lead Channel Impedance Value: 893 Ohm
Lead Channel Pacing Threshold Amplitude: 0.875 V
Lead Channel Pacing Threshold Amplitude: 1.5 V
Lead Channel Pacing Threshold Pulse Width: 0.4 ms
Lead Channel Pacing Threshold Pulse Width: 1 ms
Lead Channel Sensing Intrinsic Amplitude: 5.75 mV
Lead Channel Sensing Intrinsic Amplitude: 5.75 mV
Lead Channel Sensing Intrinsic Amplitude: 9.25 mV
Lead Channel Sensing Intrinsic Amplitude: 9.25 mV
Lead Channel Setting Pacing Amplitude: 1.75 V
Lead Channel Setting Pacing Amplitude: 2.5 V
Lead Channel Setting Pacing Amplitude: 2.5 V
Lead Channel Setting Pacing Pulse Width: 0.6 ms
Lead Channel Setting Pacing Pulse Width: 1 ms
Lead Channel Setting Sensing Sensitivity: 4 mV

## 2019-06-10 NOTE — Progress Notes (Signed)
PPM Remote  

## 2019-06-10 NOTE — Telephone Encounter (Signed)
The pt wanted to know if his insurance information was updated. I told him I will have someone from the billing department give call.

## 2019-06-19 ENCOUNTER — Other Ambulatory Visit: Payer: Self-pay | Admitting: Internal Medicine

## 2019-07-18 ENCOUNTER — Ambulatory Visit: Payer: 59 | Attending: Internal Medicine

## 2019-07-18 DIAGNOSIS — Z23 Encounter for immunization: Secondary | ICD-10-CM

## 2019-07-18 NOTE — Progress Notes (Signed)
   Covid-19 Vaccination Clinic  Name:  William Hartman    MRN: [id number] DOB: 05-18-1965  07/18/2019  [name] was observed post Covid-19 immunization for 15 minutes without incident. He was provided with Vaccine Information Sheet and instruction to access the V-Safe system.   [name] was instructed to call 911 with any severe reactions post vaccine: Marland Kitchen Difficulty breathing  . Swelling of face and throat  . A fast heartbeat  . A bad rash all over body  . Dizziness and weakness   Immunizations Administered    Name Date Dose VIS Date Route   Pfizer COVID-19 Vaccine 07/18/2019 10:39 AM 0.3 mL 03/13/2019 Intramuscular   Manufacturer: [employer], [employer]   Lot: [id number]   NDC: [address]-2

## 2019-07-19 ENCOUNTER — Encounter: Payer: Self-pay | Admitting: Internal Medicine

## 2019-08-11 ENCOUNTER — Ambulatory Visit: Payer: 59 | Attending: Internal Medicine

## 2019-08-11 DIAGNOSIS — Z23 Encounter for immunization: Secondary | ICD-10-CM

## 2019-08-11 NOTE — Progress Notes (Signed)
   Covid-19 Vaccination Clinic  Name:  William Hartman    MRN: [id number] DOB: Mar 31, 1966  08/11/2019  [name] was observed post Covid-19 immunization for 15 minutes without incident. He was provided with Vaccine Information Sheet and instruction to access the V-Safe system.   [name] was instructed to call 911 with any severe reactions post vaccine: Marland Kitchen Difficulty breathing  . Swelling of face and throat  . A fast heartbeat  . A bad rash all over body  . Dizziness and weakness   Immunizations Administered    Name Date Dose VIS Date Route   Pfizer COVID-19 Vaccine 08/11/2019 11:14 AM 0.3 mL 05/27/2018 Intramuscular   Manufacturer: [employer], [employer]   Lot: [id number]   NDC: [address]-5

## 2019-08-25 ENCOUNTER — Encounter: Payer: Self-pay | Admitting: Internal Medicine

## 2019-08-25 DIAGNOSIS — Q743 Arthrogryposis multiplex congenita: Secondary | ICD-10-CM

## 2019-08-25 DIAGNOSIS — R5381 Other malaise: Secondary | ICD-10-CM

## 2019-08-25 DIAGNOSIS — R2689 Other abnormalities of gait and mobility: Secondary | ICD-10-CM

## 2019-09-08 ENCOUNTER — Ambulatory Visit (INDEPENDENT_AMBULATORY_CARE_PROVIDER_SITE_OTHER): Payer: 59 | Admitting: *Deleted

## 2019-09-08 ENCOUNTER — Ambulatory Visit: Payer: 59

## 2019-09-08 DIAGNOSIS — I442 Atrioventricular block, complete: Secondary | ICD-10-CM

## 2019-09-08 LAB — CUP PACEART REMOTE DEVICE CHECK
Battery Remaining Longevity: 38 mo
Battery Voltage: 2.93 V
Brady Statistic AP VP Percent: 15.16 %
Brady Statistic AP VS Percent: 0.01 %
Brady Statistic AS VP Percent: 83.91 %
Brady Statistic AS VS Percent: 0.92 %
Brady Statistic RA Percent Paced: 15.65 %
Brady Statistic RV Percent Paced: 99.07 %
Date Time Interrogation Session: 20210608175627
Implantable Lead Implant Date: 20130401
Implantable Lead Implant Date: 20130401
Implantable Lead Implant Date: 20171124
Implantable Lead Location: 753858
Implantable Lead Location: 753859
Implantable Lead Location: 753860
Implantable Lead Model: 4598
Implantable Lead Model: 5076
Implantable Lead Model: 5076
Implantable Pulse Generator Implant Date: 20171124
Lead Channel Impedance Value: 342 Ohm
Lead Channel Impedance Value: 418 Ohm
Lead Channel Impedance Value: 456 Ohm
Lead Channel Impedance Value: 456 Ohm
Lead Channel Impedance Value: 513 Ohm
Lead Channel Impedance Value: 532 Ohm
Lead Channel Impedance Value: 551 Ohm
Lead Channel Impedance Value: 570 Ohm
Lead Channel Impedance Value: 627 Ohm
Lead Channel Impedance Value: 855 Ohm
Lead Channel Impedance Value: 874 Ohm
Lead Channel Impedance Value: 893 Ohm
Lead Channel Impedance Value: 893 Ohm
Lead Channel Impedance Value: 912 Ohm
Lead Channel Pacing Threshold Amplitude: 0.875 V
Lead Channel Pacing Threshold Amplitude: 1.625 V
Lead Channel Pacing Threshold Pulse Width: 0.4 ms
Lead Channel Pacing Threshold Pulse Width: 1 ms
Lead Channel Sensing Intrinsic Amplitude: 6.125 mV
Lead Channel Sensing Intrinsic Amplitude: 6.125 mV
Lead Channel Sensing Intrinsic Amplitude: 9.25 mV
Lead Channel Sensing Intrinsic Amplitude: 9.25 mV
Lead Channel Setting Pacing Amplitude: 1.75 V
Lead Channel Setting Pacing Amplitude: 2.5 V
Lead Channel Setting Pacing Amplitude: 2.5 V
Lead Channel Setting Pacing Pulse Width: 0.6 ms
Lead Channel Setting Pacing Pulse Width: 1 ms
Lead Channel Setting Sensing Sensitivity: 4 mV

## 2019-09-09 NOTE — Progress Notes (Signed)
Remote pacemaker transmission.   

## 2019-09-11 ENCOUNTER — Encounter: Payer: Self-pay | Admitting: Internal Medicine

## 2020-02-04 ENCOUNTER — Encounter: Payer: Self-pay | Admitting: Internal Medicine

## 2020-03-08 ENCOUNTER — Ambulatory Visit (INDEPENDENT_AMBULATORY_CARE_PROVIDER_SITE_OTHER): Payer: 59

## 2020-03-08 DIAGNOSIS — I442 Atrioventricular block, complete: Secondary | ICD-10-CM | POA: Diagnosis not present

## 2020-03-11 LAB — CUP PACEART REMOTE DEVICE CHECK
Battery Remaining Longevity: 30 mo
Battery Voltage: 2.92 V
Brady Statistic AP VP Percent: 33.14 %
Brady Statistic AP VS Percent: 0.01 %
Brady Statistic AS VP Percent: 66.85 %
Brady Statistic AS VS Percent: 0 %
Brady Statistic RA Percent Paced: 33.1 %
Brady Statistic RV Percent Paced: 99.98 %
Date Time Interrogation Session: 20211209135003
Implantable Lead Implant Date: 20130401
Implantable Lead Implant Date: 20130401
Implantable Lead Implant Date: 20171124
Implantable Lead Location: 753858
Implantable Lead Location: 753859
Implantable Lead Location: 753860
Implantable Lead Model: 4598
Implantable Lead Model: 5076
Implantable Lead Model: 5076
Implantable Pulse Generator Implant Date: 20171124
Lead Channel Impedance Value: 342 Ohm
Lead Channel Impedance Value: 418 Ohm
Lead Channel Impedance Value: 456 Ohm
Lead Channel Impedance Value: 456 Ohm
Lead Channel Impedance Value: 494 Ohm
Lead Channel Impedance Value: 532 Ohm
Lead Channel Impedance Value: 551 Ohm
Lead Channel Impedance Value: 589 Ohm
Lead Channel Impedance Value: 627 Ohm
Lead Channel Impedance Value: 855 Ohm
Lead Channel Impedance Value: 855 Ohm
Lead Channel Impedance Value: 874 Ohm
Lead Channel Impedance Value: 893 Ohm
Lead Channel Impedance Value: 931 Ohm
Lead Channel Pacing Threshold Amplitude: 0.875 V
Lead Channel Pacing Threshold Amplitude: 1.875 V
Lead Channel Pacing Threshold Pulse Width: 0.4 ms
Lead Channel Pacing Threshold Pulse Width: 1 ms
Lead Channel Sensing Intrinsic Amplitude: 6.25 mV
Lead Channel Sensing Intrinsic Amplitude: 6.25 mV
Lead Channel Sensing Intrinsic Amplitude: 9.25 mV
Lead Channel Sensing Intrinsic Amplitude: 9.25 mV
Lead Channel Setting Pacing Amplitude: 1.75 V
Lead Channel Setting Pacing Amplitude: 2.5 V
Lead Channel Setting Pacing Amplitude: 2.5 V
Lead Channel Setting Pacing Pulse Width: 0.6 ms
Lead Channel Setting Pacing Pulse Width: 1 ms
Lead Channel Setting Sensing Sensitivity: 4 mV

## 2020-03-18 NOTE — Progress Notes (Signed)
Remote pacemaker transmission.   

## 2020-03-29 ENCOUNTER — Encounter: Payer: Self-pay | Admitting: Internal Medicine

## 2020-04-14 NOTE — Patient Instructions (Addendum)
Blood work was ordered.     No immunization administered today.   Medications changes include :   Increase coreg to 6.25 mg twice daily   Your prescription(s) have been submitted to your pharmacy.     Please followup in 6 months    Health Maintenance, Male Adopting a healthy lifestyle and getting preventive care are important in promoting health and wellness. Ask your health care provider about:  The right schedule for you to have regular tests and exams.  Things you can do on your own to prevent diseases and keep yourself healthy. What should I know about diet, weight, and exercise? Eat a healthy diet  Eat a diet that includes plenty of vegetables, fruits, low-fat dairy products, and lean protein.  Do not eat a lot of foods that are high in solid fats, added sugars, or sodium.   Maintain a healthy weight Body mass index (BMI) is a measurement that can be used to identify possible weight problems. It estimates body fat based on height and weight. Your health care provider can help determine your BMI and help you achieve or maintain a healthy weight. Get regular exercise Get regular exercise. This is one of the most important things you can do for your health. Most adults should:  Exercise for at least 150 minutes each week. The exercise should increase your heart rate and make you sweat (moderate-intensity exercise).  Do strengthening exercises at least twice a week. This is in addition to the moderate-intensity exercise.  Spend less time sitting. Even light physical activity can be beneficial. Watch cholesterol and blood lipids Have your blood tested for lipids and cholesterol at 54 years of age, then have this test every 5 years. You may need to have your cholesterol levels checked more often if:  Your lipid or cholesterol levels are high.  You are older than 54 years of age.  You are at high risk for heart disease. What should I know about cancer screening? Many  types of cancers can be detected early and may often be prevented. Depending on your health history and family history, you may need to have cancer screening at various ages. This may include screening for:  Colorectal cancer.  Prostate cancer.  Skin cancer.  Lung cancer. What should I know about heart disease, diabetes, and high blood pressure? Blood pressure and heart disease  High blood pressure causes heart disease and increases the risk of stroke. This is more likely to develop in people who have high blood pressure readings, are of African descent, or are overweight.  Talk with your health care provider about your target blood pressure readings.  Have your blood pressure checked: ? Every 3-5 years if you are 15-52 years of age. ? Every year if you are 30 years old or older.  If you are between the ages of 19 and 44 and are a current or former smoker, ask your health care provider if you should have a one-time screening for abdominal aortic aneurysm (AAA). Diabetes Have regular diabetes screenings. This checks your fasting blood sugar level. Have the screening done:  Once every three years after age 59 if you are at a normal weight and have a low risk for diabetes.  More often and at a younger age if you are overweight or have a high risk for diabetes. What should I know about preventing infection? Hepatitis B If you have a higher risk for hepatitis B, you should be screened for this virus. Talk with  your health care provider to find out if you are at risk for hepatitis B infection. Hepatitis C Blood testing is recommended for:  Everyone born from [birth year] through 1964.  Anyone with known risk factors for hepatitis C. Sexually transmitted infections (STIs)  You should be screened each year for STIs, including gonorrhea and chlamydia, if: ? You are sexually active and are younger than 54 years of age. ? You are older than 54 years of age and your health care provider tells you  that you are at risk for this type of infection. ? Your sexual activity has changed since you were last screened, and you are at increased risk for chlamydia or gonorrhea. Ask your health care provider if you are at risk.  Ask your health care provider about whether you are at high risk for HIV. Your health care provider may recommend a prescription medicine to help prevent HIV infection. If you choose to take medicine to prevent HIV, you should first get tested for HIV. You should then be tested every 3 months for as long as you are taking the medicine. Follow these instructions at home: Lifestyle  Do not use any products that contain nicotine or tobacco, such as cigarettes, e-cigarettes, and chewing tobacco. If you need help quitting, ask your health care provider.  Do not use street drugs.  Do not share needles.  Ask your health care provider for help if you need support or information about quitting drugs. Alcohol use  Do not drink alcohol if your health care provider tells you not to drink.  If you drink alcohol: ? Limit how much you have to 0-2 drinks a day. ? Be aware of how much alcohol is in your drink. In the [place] one drink equals one 12 oz bottle of beer (355 mL), one 5 oz glass of wine (148 mL), or one 1 oz glass of hard liquor (44 mL). General instructions  Schedule regular health, dental, and eye exams.  Stay current with your vaccines.  Tell your health care provider if: ? You often feel depressed. ? You have ever been abused or do not feel safe at home. Summary  Adopting a healthy lifestyle and getting preventive care are important in promoting health and wellness.  Follow your health care provider's instructions about healthy diet, exercising, and getting tested or screened for diseases.  Follow your health care provider's instructions on monitoring your cholesterol and blood pressure. This information is not intended to replace advice given to you by your  health care provider. Make sure you discuss any questions you have with your health care provider. Document Revised: 03/12/2018 Document Reviewed: 03/12/2018 Elsevier Patient Education  2021 [employer].
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
## 2020-04-15 ENCOUNTER — Encounter: Payer: Self-pay | Admitting: Internal Medicine

## 2020-04-15 ENCOUNTER — Ambulatory Visit (INDEPENDENT_AMBULATORY_CARE_PROVIDER_SITE_OTHER): Payer: Medicare PPO | Admitting: Internal Medicine

## 2020-04-15 ENCOUNTER — Other Ambulatory Visit: Payer: Self-pay

## 2020-04-15 VITALS — BP 146/82 | HR 84 | Temp 98.6°F

## 2020-04-15 DIAGNOSIS — Z0000 Encounter for general adult medical examination without abnormal findings: Secondary | ICD-10-CM

## 2020-04-15 DIAGNOSIS — R600 Localized edema: Secondary | ICD-10-CM | POA: Diagnosis not present

## 2020-04-15 DIAGNOSIS — R739 Hyperglycemia, unspecified: Secondary | ICD-10-CM | POA: Diagnosis not present

## 2020-04-15 DIAGNOSIS — I10 Essential (primary) hypertension: Secondary | ICD-10-CM | POA: Diagnosis not present

## 2020-04-15 DIAGNOSIS — E782 Mixed hyperlipidemia: Secondary | ICD-10-CM

## 2020-04-15 DIAGNOSIS — Q743 Arthrogryposis multiplex congenita: Secondary | ICD-10-CM

## 2020-04-15 DIAGNOSIS — Z125 Encounter for screening for malignant neoplasm of prostate: Secondary | ICD-10-CM | POA: Diagnosis not present

## 2020-04-15 DIAGNOSIS — R7303 Prediabetes: Secondary | ICD-10-CM | POA: Insufficient documentation

## 2020-04-15 LAB — CBC WITH DIFFERENTIAL/PLATELET
Basophils Absolute: 0.1 10*3/uL (ref 0.0–0.1)
Basophils Relative: 0.6 % (ref 0.0–3.0)
Eosinophils Absolute: 0.2 10*3/uL (ref 0.0–0.7)
Eosinophils Relative: 2 % (ref 0.0–5.0)
HCT: 46.1 % (ref 39.0–52.0)
Hemoglobin: 15.4 g/dL (ref 13.0–17.0)
Lymphocytes Relative: 19.9 % (ref 12.0–46.0)
Lymphs Abs: 2.1 10*3/uL (ref 0.7–4.0)
MCHC: 33.3 g/dL (ref 30.0–36.0)
MCV: 92.4 fl (ref 78.0–100.0)
Monocytes Absolute: 0.9 10*3/uL (ref 0.1–1.0)
Monocytes Relative: 8.5 % (ref 3.0–12.0)
Neutro Abs: 7.2 10*3/uL (ref 1.4–7.7)
Neutrophils Relative %: 69 % (ref 43.0–77.0)
Platelets: 324 10*3/uL (ref 150.0–400.0)
RBC: 4.99 Mil/uL (ref 4.22–5.81)
RDW: 14.1 % (ref 11.5–15.5)
WBC: 10.4 10*3/uL (ref 4.0–10.5)

## 2020-04-15 LAB — LIPID PANEL
Cholesterol: 153 mg/dL (ref 0–200)
HDL: 44.7 mg/dL (ref >39.00)
LDL Cholesterol: 84 mg/dL (ref 0–99)
NonHDL: 108.41
Total CHOL/HDL Ratio: 3
Triglycerides: 120 mg/dL (ref 0.0–149.0)
VLDL: 24 mg/dL (ref 0.0–40.0)

## 2020-04-15 LAB — COMPREHENSIVE METABOLIC PANEL
ALT: 25 U/L (ref 0–53)
AST: 24 U/L (ref 0–37)
Albumin: 4.6 g/dL (ref 3.5–5.2)
Alkaline Phosphatase: 80 U/L (ref 39–117)
BUN: 16 mg/dL (ref 6–23)
CO2: 24 mEq/L (ref 19–32)
Calcium: 9.9 mg/dL (ref 8.4–10.5)
Chloride: 104 mEq/L (ref 96–112)
Creatinine, Ser: 0.58 mg/dL (ref 0.40–1.50)
GFR: 111.34 mL/min (ref >60.00)
Glucose, Bld: 106 mg/dL — ABNORMAL HIGH (ref 70–99)
Potassium: 4.6 mEq/L (ref 3.5–5.1)
Sodium: 137 mEq/L (ref 135–145)
Total Bilirubin: 0.6 mg/dL (ref 0.2–1.2)
Total Protein: 8.2 g/dL (ref 6.0–8.3)

## 2020-04-15 LAB — PSA: PSA: 1.01 ng/mL (ref 0.10–4.00)

## 2020-04-15 LAB — TSH: TSH: 1.54 u[IU]/mL (ref 0.35–4.50)

## 2020-04-15 LAB — HEMOGLOBIN A1C: Hgb A1c MFr Bld: 5.7 % (ref 4.6–6.5)

## 2020-04-15 MED ORDER — CARVEDILOL 6.25 MG PO TABS
6.2500 mg | ORAL_TABLET | Freq: Two times a day (BID) | ORAL | 3 refills | Status: DC
Start: 2020-04-15 — End: 2021-05-09

## 2020-04-15 NOTE — Assessment & Plan Note (Signed)
Chronic Slightly improved since he is not working and standing is much Continue furosemide 20 mg daily as needed CMP

## 2020-04-15 NOTE — Assessment & Plan Note (Signed)
Chronic On disability Stable

## 2020-04-15 NOTE — Assessment & Plan Note (Signed)
Chronic Check a1c Low sugar / carb diet Stressed regular exercise  

## 2020-04-15 NOTE — Assessment & Plan Note (Signed)
Chronic Check lipid panel, CMP, TSH Continue atorvastatin 20 mg daily Regular exercise and healthy diet encouraged  

## 2020-04-15 NOTE — Assessment & Plan Note (Signed)
Chronic Blood pressure has been elevated at home and is slightly elevated here today Increase carvedilol to 6.25 mg twice daily CMP, CBC

## 2020-04-18 ENCOUNTER — Encounter: Payer: Self-pay | Admitting: Internal Medicine

## 2020-06-07 ENCOUNTER — Ambulatory Visit (INDEPENDENT_AMBULATORY_CARE_PROVIDER_SITE_OTHER): Payer: Medicare PPO

## 2020-06-07 DIAGNOSIS — I442 Atrioventricular block, complete: Secondary | ICD-10-CM

## 2020-06-08 LAB — CUP PACEART REMOTE DEVICE CHECK
Battery Remaining Longevity: 29 mo
Battery Voltage: 2.91 V
Brady Statistic AP VP Percent: 31.5 %
Brady Statistic AP VS Percent: 0.01 %
Brady Statistic AS VP Percent: 68.48 %
Brady Statistic AS VS Percent: 0.01 %
Brady Statistic RA Percent Paced: 31.46 %
Brady Statistic RV Percent Paced: 99.98 %
Date Time Interrogation Session: 20220308011313
Implantable Lead Implant Date: 20130401
Implantable Lead Implant Date: 20130401
Implantable Lead Implant Date: 20171124
Implantable Lead Location: 753858
Implantable Lead Location: 753859
Implantable Lead Location: 753860
Implantable Lead Model: 4598
Implantable Lead Model: 5076
Implantable Lead Model: 5076
Implantable Pulse Generator Implant Date: 20171124
Lead Channel Impedance Value: 342 Ohm
Lead Channel Impedance Value: 418 Ohm
Lead Channel Impedance Value: 456 Ohm
Lead Channel Impedance Value: 475 Ohm
Lead Channel Impedance Value: 513 Ohm
Lead Channel Impedance Value: 570 Ohm
Lead Channel Impedance Value: 589 Ohm
Lead Channel Impedance Value: 589 Ohm
Lead Channel Impedance Value: 722 Ohm
Lead Channel Impedance Value: 893 Ohm
Lead Channel Impedance Value: 912 Ohm
Lead Channel Impedance Value: 950 Ohm
Lead Channel Impedance Value: 988 Ohm
Lead Channel Impedance Value: 988 Ohm
Lead Channel Pacing Threshold Amplitude: 0.75 V
Lead Channel Pacing Threshold Amplitude: 1.875 V
Lead Channel Pacing Threshold Pulse Width: 0.4 ms
Lead Channel Pacing Threshold Pulse Width: 1 ms
Lead Channel Sensing Intrinsic Amplitude: 5.625 mV
Lead Channel Sensing Intrinsic Amplitude: 5.625 mV
Lead Channel Sensing Intrinsic Amplitude: 9.25 mV
Lead Channel Sensing Intrinsic Amplitude: 9.25 mV
Lead Channel Setting Pacing Amplitude: 1.75 V
Lead Channel Setting Pacing Amplitude: 2.5 V
Lead Channel Setting Pacing Amplitude: 2.5 V
Lead Channel Setting Pacing Pulse Width: 0.6 ms
Lead Channel Setting Pacing Pulse Width: 1 ms
Lead Channel Setting Sensing Sensitivity: 4 mV

## 2020-06-15 NOTE — Progress Notes (Signed)
Remote pacemaker transmission.   

## 2020-07-01 ENCOUNTER — Other Ambulatory Visit: Payer: Self-pay | Admitting: Internal Medicine

## 2020-09-06 ENCOUNTER — Ambulatory Visit (INDEPENDENT_AMBULATORY_CARE_PROVIDER_SITE_OTHER): Payer: Medicare PPO

## 2020-09-06 DIAGNOSIS — I442 Atrioventricular block, complete: Secondary | ICD-10-CM

## 2020-09-08 LAB — CUP PACEART REMOTE DEVICE CHECK
Battery Remaining Longevity: 30 mo
Battery Voltage: 2.91 V
Brady Statistic AP VP Percent: 33.34 %
Brady Statistic AP VS Percent: 0.01 %
Brady Statistic AS VP Percent: 66.64 %
Brady Statistic AS VS Percent: 0.01 %
Brady Statistic RA Percent Paced: 33.3 %
Brady Statistic RV Percent Paced: 99.98 %
Date Time Interrogation Session: 20220608223847
Implantable Lead Implant Date: 20130401
Implantable Lead Implant Date: 20130401
Implantable Lead Implant Date: 20171124
Implantable Lead Location: 753858
Implantable Lead Location: 753859
Implantable Lead Location: 753860
Implantable Lead Model: 4598
Implantable Lead Model: 5076
Implantable Lead Model: 5076
Implantable Pulse Generator Implant Date: 20171124
Lead Channel Impedance Value: 1007 Ohm
Lead Channel Impedance Value: 1064 Ohm
Lead Channel Impedance Value: 1083 Ohm
Lead Channel Impedance Value: 342 Ohm
Lead Channel Impedance Value: 418 Ohm
Lead Channel Impedance Value: 437 Ohm
Lead Channel Impedance Value: 475 Ohm
Lead Channel Impedance Value: 475 Ohm
Lead Channel Impedance Value: 551 Ohm
Lead Channel Impedance Value: 570 Ohm
Lead Channel Impedance Value: 665 Ohm
Lead Channel Impedance Value: 684 Ohm
Lead Channel Impedance Value: 836 Ohm
Lead Channel Impedance Value: 855 Ohm
Lead Channel Pacing Threshold Amplitude: 0.875 V
Lead Channel Pacing Threshold Amplitude: 2.125 V
Lead Channel Pacing Threshold Pulse Width: 0.4 ms
Lead Channel Pacing Threshold Pulse Width: 1 ms
Lead Channel Sensing Intrinsic Amplitude: 5.5 mV
Lead Channel Sensing Intrinsic Amplitude: 5.5 mV
Lead Channel Sensing Intrinsic Amplitude: 9.25 mV
Lead Channel Sensing Intrinsic Amplitude: 9.25 mV
Lead Channel Setting Pacing Amplitude: 1.75 V
Lead Channel Setting Pacing Amplitude: 2.5 V
Lead Channel Setting Pacing Amplitude: 2.5 V
Lead Channel Setting Pacing Pulse Width: 0.6 ms
Lead Channel Setting Pacing Pulse Width: 1 ms
Lead Channel Setting Sensing Sensitivity: 4 mV

## 2020-09-28 NOTE — Progress Notes (Signed)
Remote pacemaker transmission.   

## 2020-10-06 NOTE — Progress Notes (Signed)
Virtual Visit via Video Note  I connected with William Hartman on 10/06/20 at  2:00 PM EDT by a video enabled telemedicine application and verified that I am speaking with the correct person using two identifiers.   I discussed the limitations of evaluation and management by telemedicine and the availability of in person appointments. The patient expressed understanding and agreed to proceed.  Present for the visit:  Myself, Dr Cheryll Cockayne, William Hartman.  The patient is currently at home and I am in the office.    No referring provider.    History of Present Illness: He is here for follow up of his chronic medical conditions: htn, hld, hyperglycemia, b/l leg edema, arthrogryposis multiplex congenita.     He is taking all of his medications as prescribed.   He denies falls.    BP a little high - 120-140/90-100.  He does get a little dizziness at times which she thinks is from blood pressure medication does not monitor make any changes now with his medication.  He has constant pain in his right hip.  It is worse at night.  He wonders if doing some Physical therapy would help.  He is not ready to have surgery.  He has seen orthopedics in the past.    Review of Systems  Constitutional:  Negative for fever.  Respiratory:  Negative for cough, shortness of breath and wheezing.   Cardiovascular:  Positive for leg swelling (mild- moderate). Negative for chest pain and palpitations.  Musculoskeletal:  Positive for joint pain.  Neurological:  Positive for dizziness (from coreg). Negative for headaches.     Social History   Socioeconomic History   Marital status: Married    Spouse name: Not on file   Number of children: Not on file   Years of education: Not on file   Highest education level: Not on file  Occupational History   Not on file  Tobacco Use   Smoking status: Never   Smokeless tobacco: Never  Vaping Use   Vaping Use: Never used  Substance and Sexual Activity   Alcohol use:  Yes    Alcohol/week: 1.0 standard drink    Types: 1 Glasses of wine per week    Comment: "not much"   Drug use: No   Sexual activity: Yes    Comment: He lives in [place] with his wife. 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 He has limited mobility, but is able to walk for routine things if needs to be up for long while, uses a wheelchair or scooter  Other Topics Concern   Not on file  Social History Narrative   Not on file   Social Determinants of Health   Financial Resource Strain: Not on file  Food Insecurity: Not on file  Transportation Needs: Not on file  Physical Activity: Not on file  Stress: Not on file  Social Connections: Not on file     Observations/Objective: Appears well in NAD Breathing normally  Assessment and Plan:  See Problem List for Assessment and Plan of chronic medical problems.   Follow Up Instructions:    I discussed the assessment and treatment plan with the patient. The patient was provided an opportunity to ask questions and all were answered. The patient agreed with the plan and demonstrated an understanding of the instructions.   The patient was advised to call back or seek an in-person evaluation if the symptoms worsen or if the condition fails to improve  as anticipated.    Binnie Rail, MD

## 2020-10-07 ENCOUNTER — Encounter: Payer: Self-pay | Admitting: Internal Medicine

## 2020-10-07 ENCOUNTER — Telehealth (INDEPENDENT_AMBULATORY_CARE_PROVIDER_SITE_OTHER): Payer: Medicare PPO | Admitting: Internal Medicine

## 2020-10-07 DIAGNOSIS — Q743 Arthrogryposis multiplex congenita: Secondary | ICD-10-CM

## 2020-10-07 DIAGNOSIS — R739 Hyperglycemia, unspecified: Secondary | ICD-10-CM

## 2020-10-07 DIAGNOSIS — I10 Essential (primary) hypertension: Secondary | ICD-10-CM | POA: Diagnosis not present

## 2020-10-07 DIAGNOSIS — E782 Mixed hyperlipidemia: Secondary | ICD-10-CM | POA: Diagnosis not present

## 2020-10-07 DIAGNOSIS — M25551 Pain in right hip: Secondary | ICD-10-CM | POA: Diagnosis not present

## 2020-10-07 DIAGNOSIS — R600 Localized edema: Secondary | ICD-10-CM

## 2020-10-07 NOTE — Assessment & Plan Note (Signed)
Chronic Continue atorvastatin 20 mg daily 

## 2020-10-07 NOTE — Assessment & Plan Note (Signed)
Chronic He does have a healthy diet and sugars have been stable He is trying to lose a little weight Will check A1c at his next visit

## 2020-10-07 NOTE — Assessment & Plan Note (Signed)
Chronic Blood pressure at home slightly higher than ideal He is not interested in making any medication changes at this time.  He feels a little dizzy at times Will continue carvedilol 6.25 mg twice daily May need to consider changing his medications to help with the dizziness He will continue to monitor his blood pressure for now

## 2020-10-07 NOTE — Assessment & Plan Note (Signed)
Chronic ?Stable ?Continue Lasix 20 mg daily as needed ?

## 2020-10-07 NOTE — Assessment & Plan Note (Signed)
Chronic Currently on disability Denies any falls Would benefit from physical therapy especially with his severe hip arthritis Will refer to home PT

## 2020-10-07 NOTE — Assessment & Plan Note (Addendum)
Chronic Having increased pain-likely related to severe osteoarthritis, his chronic arthrogryposis multiplex congenita is likely contributing He is interested in trying physical therapy-referred for home PT Advised f/u with Dr Eulah Pont if pain worsens

## 2020-10-21 DIAGNOSIS — M1611 Unilateral primary osteoarthritis, right hip: Secondary | ICD-10-CM | POA: Diagnosis not present

## 2020-10-31 ENCOUNTER — Encounter: Payer: Self-pay | Admitting: Internal Medicine

## 2020-10-31 NOTE — Telephone Encounter (Signed)
Per chart referral was faxed to [facility] but pt states he has not heard from anyone. Can you follow-up and give him a call w/ status.Marland KitchenRaechel Chute

## 2020-11-03 DIAGNOSIS — Z950 Presence of cardiac pacemaker: Secondary | ICD-10-CM | POA: Diagnosis not present

## 2020-11-03 DIAGNOSIS — R030 Elevated blood-pressure reading, without diagnosis of hypertension: Secondary | ICD-10-CM | POA: Diagnosis not present

## 2020-11-03 DIAGNOSIS — I2510 Atherosclerotic heart disease of native coronary artery without angina pectoris: Secondary | ICD-10-CM | POA: Diagnosis not present

## 2020-11-03 DIAGNOSIS — Z809 Family history of malignant neoplasm, unspecified: Secondary | ICD-10-CM | POA: Diagnosis not present

## 2020-11-03 DIAGNOSIS — Z811 Family history of alcohol abuse and dependence: Secondary | ICD-10-CM | POA: Diagnosis not present

## 2020-11-03 DIAGNOSIS — E785 Hyperlipidemia, unspecified: Secondary | ICD-10-CM | POA: Diagnosis not present

## 2020-11-03 DIAGNOSIS — Z833 Family history of diabetes mellitus: Secondary | ICD-10-CM | POA: Diagnosis not present

## 2020-11-04 ENCOUNTER — Telehealth: Payer: Self-pay | Admitting: Internal Medicine

## 2020-11-04 DIAGNOSIS — G8929 Other chronic pain: Secondary | ICD-10-CM | POA: Diagnosis not present

## 2020-11-04 DIAGNOSIS — I10 Essential (primary) hypertension: Secondary | ICD-10-CM

## 2020-11-04 DIAGNOSIS — R600 Localized edema: Secondary | ICD-10-CM | POA: Diagnosis not present

## 2020-11-04 DIAGNOSIS — Z9181 History of falling: Secondary | ICD-10-CM | POA: Diagnosis not present

## 2020-11-04 DIAGNOSIS — E782 Mixed hyperlipidemia: Secondary | ICD-10-CM | POA: Diagnosis not present

## 2020-11-04 DIAGNOSIS — Q743 Arthrogryposis multiplex congenita: Secondary | ICD-10-CM | POA: Diagnosis not present

## 2020-11-04 DIAGNOSIS — R739 Hyperglycemia, unspecified: Secondary | ICD-10-CM | POA: Diagnosis not present

## 2020-11-04 DIAGNOSIS — M1611 Unilateral primary osteoarthritis, right hip: Secondary | ICD-10-CM | POA: Diagnosis not present

## 2020-11-04 DIAGNOSIS — E7800 Pure hypercholesterolemia, unspecified: Secondary | ICD-10-CM | POA: Diagnosis not present

## 2020-11-04 NOTE — Telephone Encounter (Signed)
Can you call patient and see what his heart rate has been running at home.  I think if his blood pressure is quite high we need to increase his medication.  We can increase the carvedilol to a higher dose if his heart rate is not too low.  If it is we will need to add a different medication.  If his blood pressure cuff is not accurate with what physical therapy is getting he should consider getting a new blood pressure cuff

## 2020-11-04 NOTE — Telephone Encounter (Signed)
William Hartman  [id number]  Eval performed today, recommends  Physical therapy one time weekly for six weeks  Few concerns: Blood pressuer left arm 167/110  right arm 160/100 prior to and after exertion  No info regarding past medical history in the referral, or current medications  please fax to Rosanne Ashing at [phone]  Confirm that she is aware that he has a skin condition by ankles, no broken skin currently, patient states it is in his past medical history, a chronic condition

## 2020-11-07 NOTE — Telephone Encounter (Signed)
I think we should continue coreg at current dose and add telmisartan 40 mg daily and titrate as needed.  We need to check cmp in 2 weeks after starting new med - let me know if he agrees and I will order medication

## 2020-11-08 MED ORDER — TELMISARTAN 40 MG PO TABS: 40.0000 mg | ORAL_TABLET | Freq: Every day | ORAL | 3 refills | Status: DC

## 2020-11-08 NOTE — Telephone Encounter (Signed)
Med sent to pharm  Blood work ordered

## 2020-11-08 NOTE — Addendum Note (Signed)
Addended by: Pincus Sanes on: 11/08/2020 07:41 PM   Modules accepted: Orders

## 2020-11-10 ENCOUNTER — Encounter: Payer: Self-pay | Admitting: Internal Medicine

## 2020-11-11 DIAGNOSIS — R739 Hyperglycemia, unspecified: Secondary | ICD-10-CM | POA: Diagnosis not present

## 2020-11-11 DIAGNOSIS — E782 Mixed hyperlipidemia: Secondary | ICD-10-CM | POA: Diagnosis not present

## 2020-11-11 DIAGNOSIS — E7800 Pure hypercholesterolemia, unspecified: Secondary | ICD-10-CM | POA: Diagnosis not present

## 2020-11-11 DIAGNOSIS — M1611 Unilateral primary osteoarthritis, right hip: Secondary | ICD-10-CM | POA: Diagnosis not present

## 2020-11-11 DIAGNOSIS — I10 Essential (primary) hypertension: Secondary | ICD-10-CM | POA: Diagnosis not present

## 2020-11-11 DIAGNOSIS — R600 Localized edema: Secondary | ICD-10-CM | POA: Diagnosis not present

## 2020-11-11 DIAGNOSIS — Z9181 History of falling: Secondary | ICD-10-CM | POA: Diagnosis not present

## 2020-11-11 DIAGNOSIS — Q743 Arthrogryposis multiplex congenita: Secondary | ICD-10-CM | POA: Diagnosis not present

## 2020-11-11 DIAGNOSIS — G8929 Other chronic pain: Secondary | ICD-10-CM | POA: Diagnosis not present

## 2020-11-14 DIAGNOSIS — Q743 Arthrogryposis multiplex congenita: Secondary | ICD-10-CM | POA: Diagnosis not present

## 2020-11-14 DIAGNOSIS — R600 Localized edema: Secondary | ICD-10-CM | POA: Diagnosis not present

## 2020-11-14 DIAGNOSIS — G8929 Other chronic pain: Secondary | ICD-10-CM | POA: Diagnosis not present

## 2020-11-14 DIAGNOSIS — E782 Mixed hyperlipidemia: Secondary | ICD-10-CM | POA: Diagnosis not present

## 2020-11-14 DIAGNOSIS — Z9181 History of falling: Secondary | ICD-10-CM | POA: Diagnosis not present

## 2020-11-14 DIAGNOSIS — M1611 Unilateral primary osteoarthritis, right hip: Secondary | ICD-10-CM | POA: Diagnosis not present

## 2020-11-14 DIAGNOSIS — R739 Hyperglycemia, unspecified: Secondary | ICD-10-CM | POA: Diagnosis not present

## 2020-11-14 DIAGNOSIS — I10 Essential (primary) hypertension: Secondary | ICD-10-CM | POA: Diagnosis not present

## 2020-11-14 DIAGNOSIS — Z950 Presence of cardiac pacemaker: Secondary | ICD-10-CM

## 2020-11-14 DIAGNOSIS — E7800 Pure hypercholesterolemia, unspecified: Secondary | ICD-10-CM | POA: Diagnosis not present

## 2020-11-18 ENCOUNTER — Encounter: Payer: Self-pay | Admitting: Internal Medicine

## 2020-11-18 DIAGNOSIS — R739 Hyperglycemia, unspecified: Secondary | ICD-10-CM | POA: Diagnosis not present

## 2020-11-18 DIAGNOSIS — R600 Localized edema: Secondary | ICD-10-CM | POA: Diagnosis not present

## 2020-11-18 DIAGNOSIS — I10 Essential (primary) hypertension: Secondary | ICD-10-CM

## 2020-11-18 DIAGNOSIS — E7800 Pure hypercholesterolemia, unspecified: Secondary | ICD-10-CM | POA: Diagnosis not present

## 2020-11-18 DIAGNOSIS — Z9181 History of falling: Secondary | ICD-10-CM | POA: Diagnosis not present

## 2020-11-18 DIAGNOSIS — E782 Mixed hyperlipidemia: Secondary | ICD-10-CM | POA: Diagnosis not present

## 2020-11-18 DIAGNOSIS — M1611 Unilateral primary osteoarthritis, right hip: Secondary | ICD-10-CM | POA: Diagnosis not present

## 2020-11-18 DIAGNOSIS — G8929 Other chronic pain: Secondary | ICD-10-CM | POA: Diagnosis not present

## 2020-11-18 DIAGNOSIS — Q743 Arthrogryposis multiplex congenita: Secondary | ICD-10-CM | POA: Diagnosis not present

## 2020-11-23 NOTE — Telephone Encounter (Signed)
Sent pt a mychart message to schedule a lab visit.

## 2020-11-25 DIAGNOSIS — G8929 Other chronic pain: Secondary | ICD-10-CM | POA: Diagnosis not present

## 2020-11-25 DIAGNOSIS — R739 Hyperglycemia, unspecified: Secondary | ICD-10-CM | POA: Diagnosis not present

## 2020-11-25 DIAGNOSIS — Q743 Arthrogryposis multiplex congenita: Secondary | ICD-10-CM | POA: Diagnosis not present

## 2020-11-25 DIAGNOSIS — Z9181 History of falling: Secondary | ICD-10-CM | POA: Diagnosis not present

## 2020-11-25 DIAGNOSIS — E782 Mixed hyperlipidemia: Secondary | ICD-10-CM | POA: Diagnosis not present

## 2020-11-25 DIAGNOSIS — E7800 Pure hypercholesterolemia, unspecified: Secondary | ICD-10-CM | POA: Diagnosis not present

## 2020-11-25 DIAGNOSIS — I10 Essential (primary) hypertension: Secondary | ICD-10-CM | POA: Diagnosis not present

## 2020-11-25 DIAGNOSIS — M1611 Unilateral primary osteoarthritis, right hip: Secondary | ICD-10-CM | POA: Diagnosis not present

## 2020-11-25 DIAGNOSIS — R600 Localized edema: Secondary | ICD-10-CM | POA: Diagnosis not present

## 2020-11-29 ENCOUNTER — Other Ambulatory Visit: Payer: Medicare PPO

## 2020-11-29 ENCOUNTER — Other Ambulatory Visit: Payer: Self-pay

## 2020-11-29 DIAGNOSIS — I10 Essential (primary) hypertension: Secondary | ICD-10-CM

## 2020-12-02 ENCOUNTER — Other Ambulatory Visit: Payer: Self-pay

## 2020-12-02 ENCOUNTER — Other Ambulatory Visit: Payer: Medicare PPO

## 2020-12-02 DIAGNOSIS — Z9181 History of falling: Secondary | ICD-10-CM | POA: Diagnosis not present

## 2020-12-02 DIAGNOSIS — Q743 Arthrogryposis multiplex congenita: Secondary | ICD-10-CM | POA: Diagnosis not present

## 2020-12-02 DIAGNOSIS — R600 Localized edema: Secondary | ICD-10-CM | POA: Diagnosis not present

## 2020-12-02 DIAGNOSIS — R739 Hyperglycemia, unspecified: Secondary | ICD-10-CM | POA: Diagnosis not present

## 2020-12-02 DIAGNOSIS — G8929 Other chronic pain: Secondary | ICD-10-CM | POA: Diagnosis not present

## 2020-12-02 DIAGNOSIS — E7800 Pure hypercholesterolemia, unspecified: Secondary | ICD-10-CM | POA: Diagnosis not present

## 2020-12-02 DIAGNOSIS — M1611 Unilateral primary osteoarthritis, right hip: Secondary | ICD-10-CM | POA: Diagnosis not present

## 2020-12-02 DIAGNOSIS — E782 Mixed hyperlipidemia: Secondary | ICD-10-CM | POA: Diagnosis not present

## 2020-12-02 DIAGNOSIS — I10 Essential (primary) hypertension: Secondary | ICD-10-CM | POA: Diagnosis not present

## 2020-12-02 NOTE — Telephone Encounter (Signed)
Okay for verbal.  Blood pressure acceptable

## 2020-12-02 NOTE — Telephone Encounter (Signed)
   Patient  has been to the lab today they were unable to get specimen; hard to get a vein, several sticks- no blood.  Patient wants to know if he should continue Telmisartan at this time

## 2020-12-02 NOTE — Telephone Encounter (Signed)
   William Hartman calling to report BP 130/86  Also, requesting  verbal order for  PT, [id number]  PHONE [phone]

## 2020-12-06 ENCOUNTER — Ambulatory Visit: Payer: 59

## 2020-12-06 NOTE — Telephone Encounter (Signed)
Verbals given to [place] today.

## 2020-12-09 LAB — CUP PACEART REMOTE DEVICE CHECK
Battery Remaining Longevity: 29 mo
Battery Voltage: 2.9 V
Brady Statistic AP VP Percent: 33.36 %
Brady Statistic AP VS Percent: 0.01 %
Brady Statistic AS VP Percent: 66.63 %
Brady Statistic AS VS Percent: 0.01 %
Brady Statistic RA Percent Paced: 33.32 %
Brady Statistic RV Percent Paced: 99.98 %
Date Time Interrogation Session: 20220908021429
Implantable Lead Implant Date: 20130401
Implantable Lead Implant Date: 20130401
Implantable Lead Implant Date: 20171124
Implantable Lead Location: 753858
Implantable Lead Location: 753859
Implantable Lead Location: 753860
Implantable Lead Model: 4598
Implantable Lead Model: 5076
Implantable Lead Model: 5076
Implantable Pulse Generator Implant Date: 20171124
Lead Channel Impedance Value: 1026 Ohm
Lead Channel Impedance Value: 1064 Ohm
Lead Channel Impedance Value: 1102 Ohm
Lead Channel Impedance Value: 323 Ohm
Lead Channel Impedance Value: 418 Ohm
Lead Channel Impedance Value: 437 Ohm
Lead Channel Impedance Value: 437 Ohm
Lead Channel Impedance Value: 494 Ohm
Lead Channel Impedance Value: 532 Ohm
Lead Channel Impedance Value: 608 Ohm
Lead Channel Impedance Value: 684 Ohm
Lead Channel Impedance Value: 703 Ohm
Lead Channel Impedance Value: 836 Ohm
Lead Channel Impedance Value: 874 Ohm
Lead Channel Pacing Threshold Amplitude: 1 V
Lead Channel Pacing Threshold Amplitude: 2 V
Lead Channel Pacing Threshold Pulse Width: 0.4 ms
Lead Channel Pacing Threshold Pulse Width: 1 ms
Lead Channel Sensing Intrinsic Amplitude: 5.875 mV
Lead Channel Sensing Intrinsic Amplitude: 5.875 mV
Lead Channel Sensing Intrinsic Amplitude: 9.25 mV
Lead Channel Sensing Intrinsic Amplitude: 9.25 mV
Lead Channel Setting Pacing Amplitude: 2 V
Lead Channel Setting Pacing Amplitude: 2.5 V
Lead Channel Setting Pacing Amplitude: 2.5 V
Lead Channel Setting Pacing Pulse Width: 0.6 ms
Lead Channel Setting Pacing Pulse Width: 1 ms
Lead Channel Setting Sensing Sensitivity: 4 mV

## 2020-12-16 ENCOUNTER — Telehealth: Payer: Self-pay | Admitting: Internal Medicine

## 2020-12-16 NOTE — Telephone Encounter (Signed)
Patient was not able to send remote transmission while on the phone. Will send over the weekend.

## 2020-12-16 NOTE — Telephone Encounter (Signed)
Rosanne Ashing calling in to notify provider about patient  Patient BP reading better today 110/80  Patient reported around 12 when sitted at cpu had sudden dizziness/weakness episode lasting around 30 seconds.. similar to times in past when patient feels like pacemaker is taking over  Left message w/ pacemaker provider to see if anything was picked up

## 2020-12-16 NOTE — Telephone Encounter (Signed)
Spoke with Rosanne Ashing, PT with [facility] who reports that he saw the patient today and he mentioned that he had a weird sensation yesterday. He states that the patient was vague about his symptoms but he was sitting in his chair and felt like he did not have any control of his body. He felt as if he was falling over. He reports that this happens sometimes and he thinks it has something to do with his pacemaker.  Otherwise patient is doing good with no other complaints.  Will forward to device team.

## 2020-12-16 NOTE — Telephone Encounter (Signed)
Returning phone call.  Patient reports yesterday he had episode of "balance checks out for about 20-30 seconds." Denies loc. States after the episode he stood still for a few minutes an felt well afterward.  Denies any other symptoms.   Requested patient send manual transmission and we will review Monday.   ED precautions reviewed w/ verbal understanding.

## 2020-12-16 NOTE — Telephone Encounter (Signed)
Noted.  May need to see cardiology if episodes continue.  May also need to rule out orthostasis.

## 2020-12-16 NOTE — Telephone Encounter (Signed)
    Rosanne Ashing with [facility] called, he said pt had an abnormal sensation yesterday 12/15/20 noon and 12:30 pm, lasting for 30 secs. He said nurse can call him or pt

## 2020-12-19 NOTE — Telephone Encounter (Signed)
Remote transmission reviewed.  Normal device function, no episodes noted.   Advised patient to follow up PCP to further evaluation. Verbalized understanding and appreciative of call.

## 2021-01-16 ENCOUNTER — Encounter: Payer: Self-pay | Admitting: Internal Medicine

## 2021-01-24 ENCOUNTER — Other Ambulatory Visit (INDEPENDENT_AMBULATORY_CARE_PROVIDER_SITE_OTHER): Payer: Medicare PPO

## 2021-01-24 DIAGNOSIS — I10 Essential (primary) hypertension: Secondary | ICD-10-CM | POA: Diagnosis not present

## 2021-01-24 DIAGNOSIS — R739 Hyperglycemia, unspecified: Secondary | ICD-10-CM

## 2021-01-24 DIAGNOSIS — E782 Mixed hyperlipidemia: Secondary | ICD-10-CM | POA: Diagnosis not present

## 2021-01-24 LAB — COMPREHENSIVE METABOLIC PANEL
ALT: 18 U/L (ref 0–53)
AST: 14 U/L (ref 0–37)
Albumin: 4.3 g/dL (ref 3.5–5.2)
Alkaline Phosphatase: 68 U/L (ref 39–117)
BUN: 15 mg/dL (ref 6–23)
CO2: 24 mEq/L (ref 19–32)
Calcium: 9.7 mg/dL (ref 8.4–10.5)
Chloride: 105 mEq/L (ref 96–112)
Creatinine, Ser: 0.61 mg/dL (ref 0.40–1.50)
GFR: 109.06 mL/min (ref >60.00)
Glucose, Bld: 101 mg/dL — ABNORMAL HIGH (ref 70–99)
Potassium: 4.3 mEq/L (ref 3.5–5.1)
Sodium: 138 mEq/L (ref 135–145)
Total Bilirubin: 0.7 mg/dL (ref 0.2–1.2)
Total Protein: 8 g/dL (ref 6.0–8.3)

## 2021-01-24 LAB — LIPID PANEL
Cholesterol: 150 mg/dL (ref 0–200)
HDL: 36.6 mg/dL — ABNORMAL LOW (ref >39.00)
LDL Cholesterol: 75 mg/dL (ref 0–99)
NonHDL: 113.02
Total CHOL/HDL Ratio: 4
Triglycerides: 189 mg/dL — ABNORMAL HIGH (ref 0.0–149.0)
VLDL: 37.8 mg/dL (ref 0.0–40.0)

## 2021-01-24 LAB — HEMOGLOBIN A1C: Hgb A1c MFr Bld: 5.7 % (ref 4.6–6.5)

## 2021-01-25 ENCOUNTER — Encounter: Payer: Self-pay | Admitting: Internal Medicine

## 2021-03-07 ENCOUNTER — Ambulatory Visit (INDEPENDENT_AMBULATORY_CARE_PROVIDER_SITE_OTHER): Payer: Medicare PPO

## 2021-03-07 DIAGNOSIS — I442 Atrioventricular block, complete: Secondary | ICD-10-CM | POA: Diagnosis not present

## 2021-03-09 LAB — CUP PACEART REMOTE DEVICE CHECK
Battery Remaining Longevity: 28 mo
Battery Voltage: 2.89 V
Brady Statistic AP VP Percent: 38.41 %
Brady Statistic AP VS Percent: 0.01 %
Brady Statistic AS VP Percent: 61.57 %
Brady Statistic AS VS Percent: 0.01 %
Brady Statistic RA Percent Paced: 38.37 %
Brady Statistic RV Percent Paced: 99.97 %
Date Time Interrogation Session: 20221207215601
Implantable Lead Implant Date: 20130401
Implantable Lead Implant Date: 20130401
Implantable Lead Implant Date: 20171124
Implantable Lead Location: 753858
Implantable Lead Location: 753859
Implantable Lead Location: 753860
Implantable Lead Model: 4598
Implantable Lead Model: 5076
Implantable Lead Model: 5076
Implantable Pulse Generator Implant Date: 20171124
Lead Channel Impedance Value: 1026 Ohm
Lead Channel Impedance Value: 323 Ohm
Lead Channel Impedance Value: 456 Ohm
Lead Channel Impedance Value: 456 Ohm
Lead Channel Impedance Value: 475 Ohm
Lead Channel Impedance Value: 475 Ohm
Lead Channel Impedance Value: 551 Ohm
Lead Channel Impedance Value: 570 Ohm
Lead Channel Impedance Value: 627 Ohm
Lead Channel Impedance Value: 627 Ohm
Lead Channel Impedance Value: 836 Ohm
Lead Channel Impedance Value: 855 Ohm
Lead Channel Impedance Value: 950 Ohm
Lead Channel Impedance Value: 969 Ohm
Lead Channel Pacing Threshold Amplitude: 0.75 V
Lead Channel Pacing Threshold Amplitude: 2.375 V
Lead Channel Pacing Threshold Pulse Width: 0.4 ms
Lead Channel Pacing Threshold Pulse Width: 1 ms
Lead Channel Sensing Intrinsic Amplitude: 4.875 mV
Lead Channel Sensing Intrinsic Amplitude: 4.875 mV
Lead Channel Sensing Intrinsic Amplitude: 9.25 mV
Lead Channel Sensing Intrinsic Amplitude: 9.25 mV
Lead Channel Setting Pacing Amplitude: 1.5 V
Lead Channel Setting Pacing Amplitude: 2.5 V
Lead Channel Setting Pacing Amplitude: 2.5 V
Lead Channel Setting Pacing Pulse Width: 0.6 ms
Lead Channel Setting Pacing Pulse Width: 1 ms
Lead Channel Setting Sensing Sensitivity: 4 mV

## 2021-03-16 NOTE — Progress Notes (Signed)
Remote pacemaker transmission.   

## 2021-04-03 ENCOUNTER — Other Ambulatory Visit: Payer: Self-pay | Admitting: Internal Medicine

## 2021-04-03 ENCOUNTER — Encounter: Payer: Self-pay | Admitting: Internal Medicine

## 2021-04-04 ENCOUNTER — Encounter: Payer: Medicare PPO | Admitting: Student

## 2021-04-04 ENCOUNTER — Encounter: Payer: Medicare PPO | Admitting: Internal Medicine
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
## 2021-05-08 NOTE — Patient Instructions (Addendum)
° ° °Blood work was ordered.   ° ° °Medications changes include :   none ° °Your prescription(s) have been submitted to your pharmacy. Please take as directed and contact our office if you believe you are having problem(s) with the medication(s). ° ° °Please followup in 1 year ° ° °Health Maintenance, Male °Adopting a healthy lifestyle and getting preventive care are important in promoting health and wellness. Ask your health care provider about: °The right schedule for you to have regular tests and exams. °Things you can do on your own to prevent diseases and keep yourself healthy. °What should I know about diet, weight, and exercise? °Eat a healthy diet ° °Eat a diet that includes plenty of vegetables, fruits, low-fat dairy products, and lean protein. °Do not eat a lot of foods that are high in solid fats, added sugars, or sodium. °Maintain a healthy weight °Body mass index (BMI) is a measurement that can be used to identify possible weight problems. It estimates body fat based on height and weight. Your health care provider can help determine your BMI and help you achieve or maintain a healthy weight. °Get regular exercise °Get regular exercise. This is one of the most important things you can do for your health. Most adults should: °Exercise for at least 150 minutes each week. The exercise should increase your heart rate and make you sweat (moderate-intensity exercise). °Do strengthening exercises at least twice a week. This is in addition to the moderate-intensity exercise. °Spend less time sitting. Even light physical activity can be beneficial. °Watch cholesterol and blood lipids °Have your blood tested for lipids and cholesterol at 55 years of age, then have this test every 5 years. °You may need to have your cholesterol levels checked more often if: °Your lipid or cholesterol levels are high. °You are older than 55 years of age. °You are at high risk for heart disease. °What should I know about cancer  screening? °Many types of cancers can be detected early and may often be prevented. Depending on your health history and family history, you may need to have cancer screening at various ages. This may include screening for: °Colorectal cancer. °Prostate cancer. °Skin cancer. °Lung cancer. °What should I know about heart disease, diabetes, and high blood pressure? °Blood pressure and heart disease °High blood pressure causes heart disease and increases the risk of stroke. This is more likely to develop in people who have high blood pressure readings or are overweight. °Talk with your health care provider about your target blood pressure readings. °Have your blood pressure checked: °Every 3-5 years if you are 18-39 years of age. °Every year if you are 40 years old or older. °If you are between the ages of 65 and 75 and are a current or former smoker, ask your health care provider if you should have a one-time screening for abdominal aortic aneurysm (AAA). °Diabetes °Have regular diabetes screenings. This checks your fasting blood sugar level. Have the screening done: °Once every three years after age 45 if you are at a normal weight and have a low risk for diabetes. °More often and at a younger age if you are overweight or have a high risk for diabetes. °What should I know about preventing infection? °Hepatitis B °If you have a higher risk for hepatitis B, you should be screened for this virus. Talk with your health care provider to find out if you are at risk for hepatitis B infection. °Hepatitis C °Blood testing is recommended for: °  Everyone born from 1945 through 1965. °Anyone with known risk factors for hepatitis C. °Sexually transmitted infections (STIs) °You should be screened each year for STIs, including gonorrhea and chlamydia, if: °You are sexually active and are younger than 55 years of age. °You are older than 55 years of age and your health care provider tells you that you are at risk for this type of  infection. °Your sexual activity has changed since you were last screened, and you are at increased risk for chlamydia or gonorrhea. Ask your health care provider if you are at risk. °Ask your health care provider about whether you are at high risk for HIV. Your health care provider may recommend a prescription medicine to help prevent HIV infection. If you choose to take medicine to prevent HIV, you should first get tested for HIV. You should then be tested every 3 months for as long as you are taking the medicine. °Follow these instructions at home: °Alcohol use °Do not drink alcohol if your health care provider tells you not to drink. °If you drink alcohol: °Limit how much you have to 0-2 drinks a day. °Know how much alcohol is in your drink. In the [place] one drink equals one 12 oz bottle of beer (355 mL), one 5 oz glass of wine (148 mL), or one 1½ oz glass of hard liquor (44 mL). °Lifestyle °Do not use any products that contain nicotine or tobacco. These products include cigarettes, chewing tobacco, and vaping devices, such as e-cigarettes. If you need help quitting, ask your health care provider. °Do not use street drugs. °Do not share needles. °Ask your health care provider for help if you need support or information about quitting drugs. °General instructions °Schedule regular health, dental, and eye exams. °Stay current with your vaccines. °Tell your health care provider if: °You often feel depressed. °You have ever been abused or do not feel safe at home. °Summary °Adopting a healthy lifestyle and getting preventive care are important in promoting health and wellness. °Follow your health care provider's instructions about healthy diet, exercising, and getting tested or screened for diseases. °Follow your health care provider's instructions on monitoring your cholesterol and blood pressure. °This information is not intended to replace advice given to you by your health care provider. Make sure you discuss  any questions you have with your health care provider. °Document Revised: 08/08/2020 Document Reviewed: 08/08/2020 °Elsevier Patient Education © 2022 [employer] ° °

## 2021-05-09 ENCOUNTER — Other Ambulatory Visit: Payer: Self-pay

## 2021-05-09 ENCOUNTER — Encounter: Payer: Self-pay | Admitting: Internal Medicine

## 2021-05-09 ENCOUNTER — Ambulatory Visit (INDEPENDENT_AMBULATORY_CARE_PROVIDER_SITE_OTHER): Payer: Medicare PPO | Admitting: Internal Medicine

## 2021-05-09 VITALS — BP 138/84 | HR 80 | Temp 98.5°F | Ht 66.0 in

## 2021-05-09 DIAGNOSIS — Q743 Arthrogryposis multiplex congenita: Secondary | ICD-10-CM

## 2021-05-09 DIAGNOSIS — R7303 Prediabetes: Secondary | ICD-10-CM

## 2021-05-09 DIAGNOSIS — Z125 Encounter for screening for malignant neoplasm of prostate: Secondary | ICD-10-CM

## 2021-05-09 DIAGNOSIS — E782 Mixed hyperlipidemia: Secondary | ICD-10-CM | POA: Diagnosis not present

## 2021-05-09 DIAGNOSIS — R739 Hyperglycemia, unspecified: Secondary | ICD-10-CM

## 2021-05-09 DIAGNOSIS — I10 Essential (primary) hypertension: Secondary | ICD-10-CM | POA: Diagnosis not present

## 2021-05-09 DIAGNOSIS — R600 Localized edema: Secondary | ICD-10-CM

## 2021-05-09 DIAGNOSIS — Z0000 Encounter for general adult medical examination without abnormal findings: Secondary | ICD-10-CM | POA: Diagnosis not present

## 2021-05-09 LAB — COMPREHENSIVE METABOLIC PANEL
ALT: 16 U/L (ref 0–53)
AST: 19 U/L (ref 0–37)
Albumin: 4.7 g/dL (ref 3.5–5.2)
Alkaline Phosphatase: 76 U/L (ref 39–117)
BUN: 23 mg/dL (ref 6–23)
CO2: 23 mEq/L (ref 19–32)
Calcium: 10.1 mg/dL (ref 8.4–10.5)
Chloride: 103 mEq/L (ref 96–112)
Creatinine, Ser: 0.84 mg/dL (ref 0.40–1.50)
GFR: 98.82 mL/min (ref >60.00)
Glucose, Bld: 86 mg/dL (ref 70–99)
Potassium: 4.6 mEq/L (ref 3.5–5.1)
Sodium: 138 mEq/L (ref 135–145)
Total Bilirubin: 0.7 mg/dL (ref 0.2–1.2)
Total Protein: 8.4 g/dL — ABNORMAL HIGH (ref 6.0–8.3)

## 2021-05-09 LAB — CBC WITH DIFFERENTIAL/PLATELET
Basophils Absolute: 0.1 10*3/uL (ref 0.0–0.1)
Basophils Relative: 0.4 % (ref 0.0–3.0)
Eosinophils Absolute: 0.2 10*3/uL (ref 0.0–0.7)
Eosinophils Relative: 1.5 % (ref 0.0–5.0)
HCT: 46.3 % (ref 39.0–52.0)
Hemoglobin: 15 g/dL (ref 13.0–17.0)
Lymphocytes Relative: 16.5 % (ref 12.0–46.0)
Lymphs Abs: 2.1 10*3/uL (ref 0.7–4.0)
MCHC: 32.5 g/dL (ref 30.0–36.0)
MCV: 94.3 fl (ref 78.0–100.0)
Monocytes Absolute: 0.8 10*3/uL (ref 0.1–1.0)
Monocytes Relative: 6 % (ref 3.0–12.0)
Neutro Abs: 9.8 10*3/uL — ABNORMAL HIGH (ref 1.4–7.7)
Neutrophils Relative %: 75.6 % (ref 43.0–77.0)
Platelets: 282 10*3/uL (ref 150.0–400.0)
RBC: 4.91 Mil/uL (ref 4.22–5.81)
RDW: 13.7 % (ref 11.5–15.5)
WBC: 12.9 10*3/uL — ABNORMAL HIGH (ref 4.0–10.5)

## 2021-05-09 LAB — LIPID PANEL
Cholesterol: 156 mg/dL (ref 0–200)
HDL: 37.4 mg/dL — ABNORMAL LOW (ref >39.00)
LDL Cholesterol: 85 mg/dL (ref 0–99)
NonHDL: 118.39
Total CHOL/HDL Ratio: 4
Triglycerides: 168 mg/dL — ABNORMAL HIGH (ref 0.0–149.0)
VLDL: 33.6 mg/dL (ref 0.0–40.0)

## 2021-05-09 LAB — PSA, MEDICARE: PSA: 1.35 ng/ml (ref 0.10–4.00)

## 2021-05-09 LAB — HEMOGLOBIN A1C: Hgb A1c MFr Bld: 5.7 % (ref 4.6–6.5)

## 2021-05-09 MED ORDER — ATORVASTATIN CALCIUM 20 MG PO TABS: 20.0000 mg | ORAL_TABLET | Freq: Every day | ORAL | 3 refills | Status: DC

## 2021-05-09 MED ORDER — CARVEDILOL 6.25 MG PO TABS: 6.2500 mg | ORAL_TABLET | Freq: Two times a day (BID) | ORAL | 3 refills | Status: DC

## 2021-05-09 MED ORDER — TELMISARTAN 40 MG PO TABS: 40.0000 mg | ORAL_TABLET | Freq: Every day | ORAL | 3 refills | Status: DC

## 2021-05-09 NOTE — Assessment & Plan Note (Addendum)
Chronic Controlled without medication 

## 2021-05-09 NOTE — Assessment & Plan Note (Addendum)
Chronic Blood pressure well controlled here, but states occasionally his blood pressure will elevate during the day CMP, CBC Continue carvedilol 6.25 mg twice daily, telmisartan 40 mg daily Advised to continue to monitor his blood pressure at home and keep a log so that he can bring that to his cardiology appointment for him to review

## 2021-05-09 NOTE — Assessment & Plan Note (Signed)
Chronic On disability Stressed regular exercise and being as active as possible

## 2021-05-09 NOTE — Assessment & Plan Note (Signed)
Chronic Check a1c Low sugar / carb diet Stressed regular exercise  

## 2021-05-09 NOTE — Assessment & Plan Note (Signed)
Chronic °Regular exercise and healthy diet encouraged °Check lipid panel  °Continue atorvastatin 20 mg daily °

## 2021-05-23 ENCOUNTER — Ambulatory Visit: Payer: Medicare PPO | Admitting: Internal Medicine

## 2021-05-23 ENCOUNTER — Encounter: Payer: Self-pay | Admitting: Internal Medicine

## 2021-05-23 ENCOUNTER — Other Ambulatory Visit: Payer: Self-pay

## 2021-05-23 VITALS — BP 110/70 | HR 95 | Ht 66.0 in

## 2021-05-23 DIAGNOSIS — I420 Dilated cardiomyopathy: Secondary | ICD-10-CM | POA: Diagnosis not present

## 2021-05-23 DIAGNOSIS — I442 Atrioventricular block, complete: Secondary | ICD-10-CM

## 2021-05-23 DIAGNOSIS — Z950 Presence of cardiac pacemaker: Secondary | ICD-10-CM

## 2021-05-23 NOTE — Patient Instructions (Signed)
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
## 2021-06-06 ENCOUNTER — Ambulatory Visit (INDEPENDENT_AMBULATORY_CARE_PROVIDER_SITE_OTHER): Payer: Medicare PPO

## 2021-06-06 DIAGNOSIS — I420 Dilated cardiomyopathy: Secondary | ICD-10-CM

## 2021-06-06 LAB — CUP PACEART REMOTE DEVICE CHECK
Battery Remaining Longevity: 25 mo
Battery Voltage: 2.88 V
Brady Statistic AP VP Percent: 27.79 %
Brady Statistic AP VS Percent: 0.01 %
Brady Statistic AS VP Percent: 72.2 %
Brady Statistic AS VS Percent: 0 %
Brady Statistic RA Percent Paced: 27.75 %
Brady Statistic RV Percent Paced: 99.98 %
Date Time Interrogation Session: 20230307011207
Implantable Lead Implant Date: 20130401
Implantable Lead Implant Date: 20130401
Implantable Lead Implant Date: 20171124
Implantable Lead Location: 753858
Implantable Lead Location: 753859
Implantable Lead Location: 753860
Implantable Lead Model: 4598
Implantable Lead Model: 5076
Implantable Lead Model: 5076
Implantable Pulse Generator Implant Date: 20171124
Lead Channel Impedance Value: 1007 Ohm
Lead Channel Impedance Value: 1026 Ohm
Lead Channel Impedance Value: 342 Ohm
Lead Channel Impedance Value: 418 Ohm
Lead Channel Impedance Value: 456 Ohm
Lead Channel Impedance Value: 475 Ohm
Lead Channel Impedance Value: 475 Ohm
Lead Channel Impedance Value: 513 Ohm
Lead Channel Impedance Value: 570 Ohm
Lead Channel Impedance Value: 646 Ohm
Lead Channel Impedance Value: 665 Ohm
Lead Channel Impedance Value: 798 Ohm
Lead Channel Impedance Value: 855 Ohm
Lead Channel Impedance Value: 950 Ohm
Lead Channel Pacing Threshold Amplitude: 0.75 V
Lead Channel Pacing Threshold Amplitude: 1.75 V
Lead Channel Pacing Threshold Pulse Width: 0.4 ms
Lead Channel Pacing Threshold Pulse Width: 1 ms
Lead Channel Sensing Intrinsic Amplitude: 4.625 mV
Lead Channel Sensing Intrinsic Amplitude: 4.625 mV
Lead Channel Sensing Intrinsic Amplitude: 9.25 mV
Lead Channel Sensing Intrinsic Amplitude: 9.25 mV
Lead Channel Setting Pacing Amplitude: 1.75 V
Lead Channel Setting Pacing Amplitude: 2.5 V
Lead Channel Setting Pacing Amplitude: 2.5 V
Lead Channel Setting Pacing Pulse Width: 0.6 ms
Lead Channel Setting Pacing Pulse Width: 1 ms
Lead Channel Setting Sensing Sensitivity: 4 mV

## 2021-06-19 NOTE — Progress Notes (Signed)
Remote pacemaker transmission.   

## 2021-07-19 ENCOUNTER — Ambulatory Visit (INDEPENDENT_AMBULATORY_CARE_PROVIDER_SITE_OTHER): Payer: Medicare PPO

## 2021-07-19 ENCOUNTER — Telehealth: Payer: Self-pay

## 2021-07-19 DIAGNOSIS — Z1211 Encounter for screening for malignant neoplasm of colon: Secondary | ICD-10-CM

## 2021-07-19 DIAGNOSIS — Z0000 Encounter for general adult medical examination without abnormal findings: Secondary | ICD-10-CM

## 2021-07-19 NOTE — Telephone Encounter (Signed)
Patient stated that he feels that his mobility has decreased.  He is currently doing physical therapy exercises 4-5 days a week for 20 mins.  Patient would like to know if there is a way to improve his mobility possibly a certain class or a different type of exercise?  ?
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
## 2021-07-19 NOTE — Patient Instructions (Signed)
William Hartman , ?Thank you for taking time to come for your Medicare Wellness Visit. I appreciate your ongoing commitment to your health goals. Please review the following plan we discussed and let me know if I can assist you in the future.  ? ?Screening recommendations/referrals: ?Cologuard: 12/26/2018; due every 3 years ?Recommended yearly ophthalmology/optometry visit for glaucoma screening and checkup ?Recommended yearly dental visit for hygiene and checkup ? ?Vaccinations: ?Influenza vaccine: declined ?Pneumococcal vaccine: 04/02/2008 ?Tdap vaccine: 02/23/2014; due every 10 years ?Shingles vaccine: declined   ?Covid-19: 07/18/2019, 08/11/2019 ? ?Advanced directives: No ? ?Conditions/risks identified: Yes ? ?Next appointment: Please schedule your next Medicare Wellness Visit with your Nurse Health Advisor in 1 year by calling [phone]. ? ?Preventive Care 40-64 Years, Male ?Preventive care refers to lifestyle choices and visits with your health care provider that can promote health and wellness. ?What does preventive care include? ?A yearly physical exam. This is also called an annual well check. ?Dental exams once or twice a year. ?Routine eye exams. Ask your health care provider how often you should have your eyes checked. ?Personal lifestyle choices, including: ?Daily care of your teeth and gums. ?Regular physical activity. ?Eating a healthy diet. ?Avoiding tobacco and drug use. ?Limiting alcohol use. ?Practicing safe sex. ?Taking low-dose aspirin every day starting at age 49. ?What happens during an annual well check? ?The services and screenings done by your health care provider during your annual well check will depend on your age, overall health, lifestyle risk factors, and family history of disease. ?Counseling  ?Your health care provider may ask you questions about your: ?Alcohol use. ?Tobacco use. ?Drug use. ?Emotional well-being. ?Home and relationship well-being. ?Sexual activity. ?Eating habits. ?Work  and work Astronomer. ?Screening  ?You may have the following tests or measurements: ?Height, weight, and BMI. ?Blood pressure. ?Lipid and cholesterol levels. These may be checked every 5 years, or more frequently if you are over 38 years old. ?Skin check. ?Lung cancer screening. You may have this screening every year starting at age 25 if you have a 30-pack-year history of smoking and currently smoke or have quit within the past 15 years. ?Fecal occult blood test (FOBT) of the stool. You may have this test every year starting at age 84. ?Flexible sigmoidoscopy or colonoscopy. You may have a sigmoidoscopy every 5 years or a colonoscopy every 10 years starting at age 65. ?Prostate cancer screening. Recommendations will vary depending on your family history and other risks. ?Hepatitis C blood test. ?Hepatitis B blood test. ?Sexually transmitted disease (STD) testing. ?Diabetes screening. This is done by checking your blood sugar (glucose) after you have not eaten for a while (fasting). You may have this done every 1-3 years. ?Discuss your test results, treatment options, and if necessary, the need for more tests with your health care provider. ?Vaccines  ?Your health care provider may recommend certain vaccines, such as: ?Influenza vaccine. This is recommended every year. ?Tetanus, diphtheria, and acellular pertussis (Tdap, Td) vaccine. You may need a Td booster every 10 years. ?Zoster vaccine. You may need this after age 83. ?Pneumococcal 13-valent conjugate (PCV13) vaccine. You may need this if you have certain conditions and have not been vaccinated. ?Pneumococcal polysaccharide (PPSV23) vaccine. You may need one or two doses if you smoke cigarettes or if you have certain conditions. ?Talk to your health care provider about which screenings and vaccines you need and how often you need them. ?This information is not intended to replace advice given to you by your health  care provider. Make sure you discuss any  questions you have with your health care provider. ?Document Released: 04/15/2015 Document Revised: 12/07/2015 Document Reviewed: 01/18/2015 ?[employer]vier Interactive Patient Education ? 2017 [employer] ? ?Fall Prevention in the Home ?Falls can cause injuries. They can happen to people of all ages. There are many things you can do to make your home safe and to help prevent falls. ?What can I do on the outside of my home? ?Regularly fix the edges of walkways and driveways and fix any cracks. ?Remove anything that might make you trip as you walk through a door, such as a raised step or threshold. ?Trim any bushes or trees on the path to your home. ?Use bright outdoor lighting. ?Clear any walking paths of anything that might make someone trip, such as rocks or tools. ?Regularly check to see if handrails are loose or broken. Make sure that both sides of any steps have handrails. ?Any raised decks and porches should have guardrails on the edges. ?Have any leaves, snow, or ice cleared regularly. ?Use sand or salt on walking paths during winter. ?Clean up any spills in your garage right away. This includes oil or grease spills. ?What can I do in the bathroom? ?Use night lights. ?Install grab bars by the toilet and in the tub and shower. Do not use towel bars as grab bars. ?Use non-skid mats or decals in the tub or shower. ?If you need to sit down in the shower, use a plastic, non-slip stool. ?Keep the floor dry. Clean up any water that spills on the floor as soon as it happens. ?Remove soap buildup in the tub or shower regularly. ?Attach bath mats securely with double-sided non-slip rug tape. ?Do not have throw rugs and other things on the floor that can make you trip. ?What can I do in the bedroom? ?Use night lights. ?Make sure that you have a light by your bed that is easy to reach. ?Do not use any sheets or blankets that are too big for your bed. They should not hang down onto the floor. ?Have a firm chair that has side  arms. You can use this for support while you get dressed. ?Do not have throw rugs and other things on the floor that can make you trip. ?What can I do in the kitchen? ?Clean up any spills right away. ?Avoid walking on wet floors. ?Keep items that you use a lot in easy-to-reach places. ?If you need to reach something above you, use a strong step stool that has a grab bar. ?Keep electrical cords out of the way. ?Do not use floor polish or wax that makes floors slippery. If you must use wax, use non-skid floor wax. ?Do not have throw rugs and other things on the floor that can make you trip. ?What can I do with my stairs? ?Do not leave any items on the stairs. ?Make sure that there are handrails on both sides of the stairs and use them. Fix handrails that are broken or loose. Make sure that handrails are as long as the stairways. ?Check any carpeting to make sure that it is firmly attached to the stairs. Fix any carpet that is loose or worn. ?Avoid having throw rugs at the top or bottom of the stairs. If you do have throw rugs, attach them to the floor with carpet tape. ?Make sure that you have a light switch at the top of the stairs and the bottom of the stairs. If you do not  have them, ask someone to add them for you. ?What else can I do to help prevent falls? ?Wear shoes that: ?Do not have high heels. ?Have rubber bottoms. ?Are comfortable and fit you well. ?Are closed at the toe. Do not wear sandals. ?If you use a stepladder: ?Make sure that it is fully opened. Do not climb a closed stepladder. ?Make sure that both sides of the stepladder are locked into place. ?Ask someone to hold it for you, if possible. ?Clearly mark and make sure that you can see: ?Any grab bars or handrails. ?First and last steps. ?Where the edge of each step is. ?Use tools that help you move around (mobility aids) if they are needed. These include: ?Canes. ?Walkers. ?Scooters. ?Crutches. ?Turn on the lights when you go into a dark area.  Replace any light bulbs as soon as they burn out. ?Set up your furniture so you have a clear path. Avoid moving your furniture around. ?If any of your floors are uneven, fix them. ?If there are any pets around yo

## 2021-07-20 NOTE — Telephone Encounter (Signed)
I am not really sure.  We could more PT, but he is already doing those exercises so it probably would be the same thing he is already doing.  Not sure which exercises/ classes would be best for him ?

## 2021-07-20 NOTE — Telephone Encounter (Signed)
Spoke with patient today. ? ?He will call back if he decides to pursue PT. ? ?He is currently still doing his current exercises. ?

## 2021-09-05 ENCOUNTER — Ambulatory Visit (INDEPENDENT_AMBULATORY_CARE_PROVIDER_SITE_OTHER): Payer: Medicare PPO

## 2021-09-05 DIAGNOSIS — I420 Dilated cardiomyopathy: Secondary | ICD-10-CM | POA: Diagnosis not present

## 2021-09-07 LAB — CUP PACEART REMOTE DEVICE CHECK
Battery Remaining Longevity: 23 mo
Battery Voltage: 2.86 V
Brady Statistic AP VP Percent: 37.68 %
Brady Statistic AP VS Percent: 0.01 %
Brady Statistic AS VP Percent: 62.13 %
Brady Statistic AS VS Percent: 0.17 %
Brady Statistic RA Percent Paced: 37.75 %
Brady Statistic RV Percent Paced: 99.81 %
Date Time Interrogation Session: 20230607231216
Implantable Lead Implant Date: 20130401
Implantable Lead Implant Date: 20130401
Implantable Lead Implant Date: 20171124
Implantable Lead Location: 753858
Implantable Lead Location: 753859
Implantable Lead Location: 753860
Implantable Lead Model: 4598
Implantable Lead Model: 5076
Implantable Lead Model: 5076
Implantable Pulse Generator Implant Date: 20171124
Lead Channel Impedance Value: 1007 Ohm
Lead Channel Impedance Value: 1064 Ohm
Lead Channel Impedance Value: 323 Ohm
Lead Channel Impedance Value: 418 Ohm
Lead Channel Impedance Value: 456 Ohm
Lead Channel Impedance Value: 475 Ohm
Lead Channel Impedance Value: 475 Ohm
Lead Channel Impedance Value: 570 Ohm
Lead Channel Impedance Value: 589 Ohm
Lead Channel Impedance Value: 684 Ohm
Lead Channel Impedance Value: 684 Ohm
Lead Channel Impedance Value: 855 Ohm
Lead Channel Impedance Value: 874 Ohm
Lead Channel Impedance Value: 969 Ohm
Lead Channel Pacing Threshold Amplitude: 0.875 V
Lead Channel Pacing Threshold Amplitude: 1.875 V
Lead Channel Pacing Threshold Pulse Width: 0.4 ms
Lead Channel Pacing Threshold Pulse Width: 1 ms
Lead Channel Sensing Intrinsic Amplitude: 5.125 mV
Lead Channel Sensing Intrinsic Amplitude: 5.125 mV
Lead Channel Sensing Intrinsic Amplitude: 9.25 mV
Lead Channel Sensing Intrinsic Amplitude: 9.25 mV
Lead Channel Setting Pacing Amplitude: 1.75 V
Lead Channel Setting Pacing Amplitude: 2.5 V
Lead Channel Setting Pacing Amplitude: 2.5 V
Lead Channel Setting Pacing Pulse Width: 0.6 ms
Lead Channel Setting Pacing Pulse Width: 1 ms
Lead Channel Setting Sensing Sensitivity: 4 mV

## 2021-09-19 NOTE — Progress Notes (Signed)
Remote pacemaker transmission.   

## 2021-11-01 ENCOUNTER — Encounter: Payer: Self-pay | Admitting: Internal Medicine

## 2021-11-01 DIAGNOSIS — Q743 Arthrogryposis multiplex congenita: Secondary | ICD-10-CM

## 2021-11-04 ENCOUNTER — Other Ambulatory Visit: Payer: Self-pay | Admitting: Internal Medicine

## 2021-11-04 DIAGNOSIS — R5381 Other malaise: Secondary | ICD-10-CM

## 2021-11-15 ENCOUNTER — Encounter: Payer: Self-pay | Admitting: Internal Medicine

## 2021-12-05 ENCOUNTER — Ambulatory Visit (INDEPENDENT_AMBULATORY_CARE_PROVIDER_SITE_OTHER): Payer: Medicare PPO

## 2021-12-05 DIAGNOSIS — I442 Atrioventricular block, complete: Secondary | ICD-10-CM

## 2021-12-07 LAB — CUP PACEART REMOTE DEVICE CHECK
Battery Remaining Longevity: 17 mo
Battery Voltage: 2.83 V
Brady Statistic AP VP Percent: 33.69 %
Brady Statistic AP VS Percent: 0.01 %
Brady Statistic AS VP Percent: 66.3 %
Brady Statistic AS VS Percent: 0 %
Brady Statistic RA Percent Paced: 33.65 %
Brady Statistic RV Percent Paced: 99.98 %
Date Time Interrogation Session: 20230907023229
Implantable Lead Implant Date: 20130401
Implantable Lead Implant Date: 20130401
Implantable Lead Implant Date: 20171124
Implantable Lead Location: 753858
Implantable Lead Location: 753859
Implantable Lead Location: 753860
Implantable Lead Model: 4598
Implantable Lead Model: 5076
Implantable Lead Model: 5076
Implantable Pulse Generator Implant Date: 20171124
Lead Channel Impedance Value: 1026 Ohm
Lead Channel Impedance Value: 1026 Ohm
Lead Channel Impedance Value: 323 Ohm
Lead Channel Impedance Value: 418 Ohm
Lead Channel Impedance Value: 437 Ohm
Lead Channel Impedance Value: 456 Ohm
Lead Channel Impedance Value: 475 Ohm
Lead Channel Impedance Value: 513 Ohm
Lead Channel Impedance Value: 570 Ohm
Lead Channel Impedance Value: 665 Ohm
Lead Channel Impedance Value: 665 Ohm
Lead Channel Impedance Value: 817 Ohm
Lead Channel Impedance Value: 855 Ohm
Lead Channel Impedance Value: 969 Ohm
Lead Channel Pacing Threshold Amplitude: 0.875 V
Lead Channel Pacing Threshold Amplitude: 2.125 V
Lead Channel Pacing Threshold Pulse Width: 0.4 ms
Lead Channel Pacing Threshold Pulse Width: 1 ms
Lead Channel Sensing Intrinsic Amplitude: 5.375 mV
Lead Channel Sensing Intrinsic Amplitude: 5.375 mV
Lead Channel Sensing Intrinsic Amplitude: 9.25 mV
Lead Channel Sensing Intrinsic Amplitude: 9.25 mV
Lead Channel Setting Pacing Amplitude: 1.75 V
Lead Channel Setting Pacing Amplitude: 2.5 V
Lead Channel Setting Pacing Amplitude: 2.5 V
Lead Channel Setting Pacing Pulse Width: 0.6 ms
Lead Channel Setting Pacing Pulse Width: 1 ms
Lead Channel Setting Sensing Sensitivity: 4 mV

## 2021-12-28 ENCOUNTER — Telehealth: Payer: Self-pay

## 2021-12-28 NOTE — Progress Notes (Signed)
Remote pacemaker transmission.   

## 2021-12-28 NOTE — Telephone Encounter (Signed)
Opening error 

## 2022-01-02 DIAGNOSIS — Z1211 Encounter for screening for malignant neoplasm of colon: Secondary | ICD-10-CM | POA: Diagnosis not present

## 2022-01-08 LAB — COLOGUARD
COLOGUARD: NEGATIVE
Cologuard: NEGATIVE

## 2022-01-17 ENCOUNTER — Ambulatory Visit: Payer: Medicare PPO

## 2022-01-24 ENCOUNTER — Ambulatory Visit: Payer: Medicare PPO | Admitting: Physical Therapy

## 2022-01-31 ENCOUNTER — Ambulatory Visit: Payer: Medicare PPO | Attending: Internal Medicine

## 2022-01-31 DIAGNOSIS — R262 Difficulty in walking, not elsewhere classified: Secondary | ICD-10-CM | POA: Diagnosis not present

## 2022-01-31 DIAGNOSIS — M6281 Muscle weakness (generalized): Secondary | ICD-10-CM | POA: Diagnosis not present

## 2022-01-31 DIAGNOSIS — R278 Other lack of coordination: Secondary | ICD-10-CM | POA: Diagnosis not present

## 2022-01-31 DIAGNOSIS — R5381 Other malaise: Secondary | ICD-10-CM | POA: Insufficient documentation

## 2022-01-31 DIAGNOSIS — R2689 Other abnormalities of gait and mobility: Secondary | ICD-10-CM | POA: Insufficient documentation

## 2022-01-31 DIAGNOSIS — R293 Abnormal posture: Secondary | ICD-10-CM | POA: Diagnosis not present

## 2022-01-31 NOTE — Therapy (Signed)
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 space for       improvement of postural stability  []  remove for transport    []  allow for seating system to fit on      base  []  amputee placement  []  1-arm drive access   r R  r L  []  enable propulsion of manual       wheelchair with one arm    []  amputee placement   Wheel rims/ Hand  rims  []  Standard    []  Specialized-____ []  provide ability to propel manual   []  increase self-propulsion with hand wheelchair weakness/decreased grasp     []  Spoke protector/guard   []  prevent hands from getting caught in spokes   Tires:  []  pneumatic  []  flat free inserts  []  solid  Style:  []  decrease roll resistance              []  prevent frequent flats  []  increase shock absorbency  []  decrease maintenance   []  decrease pain from road shock    []  decrease spasms from road shock    Wheel Locks:    []  push []  pull []  scissor  []  lock wheels for transfers  []  lock wheels from rolling   Brake/wheel lock extension:  []  R  []  L  []  allow user to operate wheel locks due to decreased reach or strength   Caster housing:  Caster size:                      Style:                                          []  suspension fork  []  maneuverability   []  stability of wheelchair   []  durability  []  maintenance  []  angle adjustment for posture  []  allow for feet to come under        wheelchair base  []  allows change in seat to floor      height   []  increase shock absorbency  []  decrease pain from road shock  []  decrease spasms from road    shock   []  Side guards  []  prevent clothing getting caught in wheel or becoming soiled  rprovide hip and pelvic stability  []  eliminates contact between body and wheels  []  limit hand contact with wheels   []  Anti-tippers      []  prevent wheelchair from tipping    backward  []  assist caregiver with curbs     POWER MOBILITY      []  Scooter/POV    []  can safely operate   []  can safely transfer   []  has adequate trunk stability   []  cannot functionally propel  manual wheelchair     []  Power mobility base    []  non-ambulatory   []  cannot functionally propel manual wheelchair   []  cannot functionally and safely      operate scooter/POV  []  can safely operate power       wheelchair  []  home is accessible  []  willing to use power wheelchair     Tilt  []  Powered tilt on powered chair  []  Powered tilt on manual chair  []  Manual tilt on manual chair Comments:  []  change position for pressure      []  elief/cannot weight shift   []  change position against      gravitational force on head and      shoulders   []  decrease pain  []  blood pressure management   []  control autonomic dysreflexia  []  decrease respiratory distress  []  management of spasticity  []  management of low tone  []  facilitate postural control   []  rest periods   []  control edema  []  increase sitting tolerance   []  aid with transfers     Recline   []  Power  recline on power chair   Manual recline on manual chair  Comments:     intermittent catheterization   manage spasticity   accommodate femur to back angle   change position for pressure relief/cannot weight shift rhigh risk of pressure sore development   tilt alone does not accomplish     effective pressure relief, maximum pressure relief achieved at -      _______ degrees tilt   _______ degrees recline    difficult to transfer to and from bed  rest periods and sleeping in chair   repositioning for transfers   bring to full recline for ADL care   clothing/diaper changes in chair   gravity PEG tube feeding   head positioning   decrease pain   blood pressure management    control autonomic dysreflexia   decrease respiratory distress   user on ventilator     Elevator on mobility base   Power wheelchair   Scooter   increase Indep in transfers    increase Indep in ADLs     bathroom function and safety   kitchen/cooking function and safety   shopping   raise height for communication  at standing level   raise height for eye contact which reduces cervical neck strain and pain   drive at raised height for safety and navigating crowds   Other:    Vertical position system  (anterior tilt)     (Drive locks-out)     Stand       (Drive enabled)   independent weight bearing   decrease joint contractures   decrease/manage spasticity   decrease/manage spasms   pressure distribution away from   scapula, sacrum, coccyx, and ischial tuberosity   increase digestion and elimination    access to counters and cabinets   increase reach   increase interaction with others at eye level, reduces neck strain   increase performance of       MRADL(s)      Power elevating legrest     Center mount (Single) 85-170 degrees        Standard (Pair) 100-170 degrees   position legs at 90 degrees, not available with std power ELR   center mount tucks into chair to decrease turning radius in home, not available with std power ELR   provide change in position for LE   elevate legs during recline     maintain placement of feet on      footplate   decrease edema   improve circulation   actuator needed to elevate legrest   actuator needed to articulate legrest preventing knees from flexing   Increase ground clearance over      curbs    STD (pair) independently                     elevate legrest   POWER WHEELCHAIR CONTROLS      Controls/input device   Expandable   Non-expandable   Proportional   Right Hand  Left Hand   Non-proportional/switches/head-array   Electrical/proximity           Mechanical      Manufacturer:___________________   Type:________________________  provides access for controlling wheelchair   programming for accurate control   progressive disease/changing condition   required for alternative drive      controls        lacks motor control to operate  proportional drive control    unable to understand proportional controls   limited  movement/strength  []  extraneous movement / tremors / ataxic / spastic       []  Upgraded electronics controller/harness    []  Single power (tilt or recline)   []  Expandable    []  Non-expandable plus   []  Multi-power (tilt, recline, power legrest, power seat lift, vertical positioning system, stand)  []  allows input device to communicate with drive motors  []  harness provides necessary connections between the controller, input device, and seat functions     []  needed in order to operate power seat functions through joystick/ input device  []  required for alternative drive controls     []  Enhanced display  []  required to connect all alternative drive controls   []  required for upgraded joystick      (lite-throw, heavy duty, micro)  []  Allows user to see in which mode and drive the wheelchair is set; necessary for alternate controls       []  Upgraded tracking electronics  []  correct tracking when on uneven surfaces makes switch driving more efficient and less fatiguing  []  increase safety when driving  []  increase ability to traverse thresholds    []  Safety / reset / mode switches     Type:    []  Used to change modes and stop the wheelchair when driving     []  Mount for joystick / input device/switches  []  swing away for access or transfers   []  attaches joystick / input device / switches to wheelchair   []  provides for consistent access  []  midline for optimal placement    []  Attendant controlled joystick plus     mount  []  safety  []  long distance driving  []  operation of seat functions  []  compliance with transportation regulations    []  Battery  []  required to power (power assist / scooter/ power wc / other):   []  Power inverter (24V to 12V)  []  required for ventilator / respiratory equipment / other:     CHAIR OPTIONS MANUAL & POWER      Armrests   []  adjustable height []  removable  []  swing away []  fixed  []  flip back   []  reclining  []  full length pads []  desk []  tube arms []  gel pads  []  provide support with elbow at 90    []  remove/flip back/swing away for  transfers  []  provide support and positioning of upper body    []  allow to come closer to table top  []  remove for access to tables  []  provide support for w/c tray  []  change of height/angles for       variable activities   []  Elbow support / Elbow stop  []  keep elbow positioned on arm pad  []  keep arms from falling off arm pad  during tilt and/or recline   Upper Extremity Support  []  Arm trough  []   R  []   L  Style:  []  swivel mount []  fixed mount   []  posterior hand support  []   tray  []  full tray  []  joystick cut out  []   R  []   L  Style:  []  decrease gravitational pull on      shoulders  []  provide support to increase UE  function  []  provide hand support in natural    position  []  position flaccid UE  []  decrease subluxation    []  decrease edema       []  manage spasticity   []  provide midline positioning  []  provide  work surface  []  placement for AAC/ Computer/ EADL       Hangers/ Legrests   []  ______ degree  []  Elevating []  articulating  []  swing away []  fixed []  lift off  []  heavy duty []  adjustable knee angle  []  adjustable calf panel   []  longer extension tube              []  provide LE support  []  maintain placement of feet on      footplate   []  accommodate lower leg length  []  accommodate to hamstring       tightness  []  enable transfers  []  provide change in position for LE's  []  elevate legs during recline    []  decrease edema  []  durability      Foot support   []  footplate []  R []  L []  flip up           []  Depth adjustable   []  angle adjustable  []  foot board/one piece    []  provide foot support  []  accommodate to ankle ROM  []  allow foot to go under wheelchair base  []  enable transfers     []  Shoe holders  []  position foot    []  decrease / manage spasticity  []  control position of LE  []  stability    []   safety     []  Ankle strap/heel      loops  []  support foot on foot support  []  decrease extraneous movement  []  provide input to heel   []  protect foot     []  Amputee adapter []  R  []  L     Style:                  Size:  []  Provide support for stump/residual extremity    []  Transportation tie-down  []  to provide crash tested tie-down brackets    []  Crutch/cane holder    []  O2 holder    []  IV hanger   []  Ventilator tray/mount    []  stabilize accessory on wheelchair       Component  Justification     []  Seat cushion      []  accommodate impaired sensation  []  decubitus ulcers present or history  []  unable to shift weight  []  increase pressure distribution  []  prevent pelvic extension  []  custom required "off-the-shelf"    seat cushion will not accommodate deformity  []  stabilize/promote pelvis alignment  []  stabilize/promote femur alignment  []  accommodate obliquity  []  accommodate multiple deformity  []  incontinent/accidents  []  low maintenance     []  seat mounts                 []  fixed []  removable  []  attach seat platform/cushion to wheelchair frame    []  Seat wedge    []  provide increased aggressiveness of seat shape to decrease sliding  down in the seat  []  accommodate ROM        []  Cover replacement   []  protect back or seat cushion  []  incontinent/accidents    []  Solid seat / insert    []  support cushion to prevent      hammocking  []  allows attachment of cushion to mobility base    []  Lateral pelvic/thigh/hip     support (Guides)     []  decrease abduction  []  accommodate pelvis  []  position upper legs  []  accommodate spasticity  []  removable for transfers     []   Lateral pelvic/thigh      supports mounts  []  fixed   []  swing-away   []  removable  []  mounts lateral pelvic/thigh supports     []  mounts lateral pelvic/thigh supports swing-away or removable for transfers    []  Medial thigh support (Pommel)  [] decrease adduction  [] accommodate ROM  []  remove for transfers    []  alignment      []  Medial thigh   []  fixed      support mounts      []  swing-away   []  removable  []  mounts medial thigh supports   []  Mounts medial supports swing- away or removable for transfers       Component  Justification   []  Back       []  provide posterior trunk support []  facilitate tone  []  provide lumbar/sacral support []  accommodate deformity  []  support trunk in midline   []  custom required "off-the-shelf" back support will not accommodate deformity   []  provide lateral trunk support []  accommodate or decrease tone            []  Back mounts  []  fixed  []  removable  []  attach back rest/cushion to wheelchair frame   []  Lateral trunk      supports  []  R []  L  []  decrease lateral trunk leaning  []  accommodate asymmetry    []  contour for increased contact  []  safety    []  control of tone    []  Lateral trunk      supports mounts  []  fixed  []  swing-away   []  removable  []  mounts lateral trunk supports     []  Mounts lateral trunk supports swing-away or removable for transfers   []  Anterior chest      strap, vest     []  decrease forward movement of shoulder  []  decrease forward movement of trunk  []  safety/stability  []  added abdominal support  []  trunk alignment  []  assistance with shoulder control   []  decrease shoulder elevation    []  Headrest      []  provide posterior head support  []  provide posterior neck support  []  provide lateral head support  []  provide anterior head support  []  support during tilt and recline  []  improve feeding     []  improve respiration  []  placement of switches  []  safety    []  accommodate ROM   []  accommodate tone  []  improve visual orientation   []  Headrest           []  fixed []  removable []  flip down      Mounting hardware   []  swing-away laterals/switches  []  mount headrest   []  mounts headrest flip down or  removable for transfers  []  mount headrest swing-away laterals   []  mount switches     []  Neck Support    []   decrease neck rotation  []  decrease forward neck flexion   Pelvic Positioner    []  std hip belt          []  padded hip belt  []  dual pull hip belt  []  four point hip belt  []  stabilize tone  []  decrease falling out of chair  []  prevent excessive extension  []  special pull angle to control      rotation  []  pad for protection over boney   prominence  []  promote comfort    []  Essential needs        bag/pouch   []  medicines []  special  food rorthotics []  clothing changes  []  diapers  []  catheter/hygiene []  ostomy supplies   The above equipment has a life- long use expectancy.  Growth and changes in medical and/or functional conditions would be the exceptions.   SUMMARY:  Why mobility device was selected; include why a lower level device is not appropriate:   ASSESSMENT:  CLINICAL IMPRESSION: Patient is a 56 y.o. male who was seen today for physical therapy evaluation for a custom wheelchair. Due to his diagnosis of Arthrogryposis multiplex congenita he requires a custom wheelchair to allow him to complete all MRADLs safely and independently. He is currently able to actively move his R knee within 8* and L knee within 10*, which is not functional for ambulation. Both ankles are maintained in a plantarflexed and inverted posture, also not conducive to safe ambulation. He is at a significantly higher risk for falling due to his congenital joint deformities. The patient has severely limited shoulder AROM as well (R shoulder abduction 24*, R shoulder flexion 16*; L shoulder abduction 20*, L shoulder flexion 18*). He also has non-functional hand strength, limited to no grip strength and wrist flexion contractures impairing his ability to use a walker, cane or crutches for ambulation or even standard push rims on a wheelchair. Patient has decided to trial a power assist custom manual wheelchair with projection push rims to assess for the chairs ability to safely meet the patients needs. Upon completing this  trial, PT and ATP will complete wheelchair components of evaluation.    OBJECTIVE IMPAIRMENTS Abnormal gait, decreased balance, decreased knowledge of use of DME, decreased mobility, difficulty walking, decreased ROM, decreased strength, hypomobility, impaired flexibility, impaired UE functional use, improper body mechanics, postural dysfunction, and pain.   ACTIVITY LIMITATIONS carrying, lifting, standing, squatting, stairs, transfers, reach over head, locomotion level, and caring for others  PARTICIPATION LIMITATIONS: interpersonal relationship, driving, shopping, community activity, occupation, and yard work  PERSONAL FACTORS Past/current experiences, Time since onset of injury/illness/exacerbation, Transportation, and 1 comorbidity: pacemaker  are also affecting patient's functional outcome.   REHAB POTENTIAL: Fair time since onset  CLINICAL DECISION MAKING: Unstable/unpredictable  EVALUATION COMPLEXITY: High                                   GOALS: One time visit. No goals established.    PLAN: PT FREQUENCY: one time visit    , PT , PT, DPT, CBIS  01/31/2022, 2:57 PM    I concur with the above findings and recommendations of the therapist:  Physician name printed:         Physician's signature:      Date:

## 2022-02-02 ENCOUNTER — Encounter: Payer: Self-pay | Admitting: Internal Medicine

## 2022-02-26 ENCOUNTER — Encounter: Payer: Self-pay | Admitting: Internal Medicine

## 2022-03-06 ENCOUNTER — Ambulatory Visit (INDEPENDENT_AMBULATORY_CARE_PROVIDER_SITE_OTHER): Payer: Medicare PPO

## 2022-03-06 DIAGNOSIS — I442 Atrioventricular block, complete: Secondary | ICD-10-CM

## 2022-03-06 LAB — CUP PACEART REMOTE DEVICE CHECK
Battery Remaining Longevity: 13 mo
Battery Voltage: 2.79 V
Brady Statistic AP VP Percent: 37.93 %
Brady Statistic AP VS Percent: 0.01 %
Brady Statistic AS VP Percent: 62.06 %
Brady Statistic AS VS Percent: 0 %
Brady Statistic RA Percent Paced: 37.88 %
Brady Statistic RV Percent Paced: 99.98 %
Date Time Interrogation Session: 20231205013234
Implantable Lead Connection Status: 753985
Implantable Lead Connection Status: 753985
Implantable Lead Connection Status: 753985
Implantable Lead Implant Date: 20130401
Implantable Lead Implant Date: 20130401
Implantable Lead Implant Date: 20171124
Implantable Lead Location: 753858
Implantable Lead Location: 753859
Implantable Lead Location: 753860
Implantable Lead Model: 4598
Implantable Lead Model: 5076
Implantable Lead Model: 5076
Implantable Pulse Generator Implant Date: 20171124
Lead Channel Impedance Value: 1026 Ohm
Lead Channel Impedance Value: 1064 Ohm
Lead Channel Impedance Value: 323 Ohm
Lead Channel Impedance Value: 418 Ohm
Lead Channel Impedance Value: 437 Ohm
Lead Channel Impedance Value: 456 Ohm
Lead Channel Impedance Value: 475 Ohm
Lead Channel Impedance Value: 551 Ohm
Lead Channel Impedance Value: 589 Ohm
Lead Channel Impedance Value: 665 Ohm
Lead Channel Impedance Value: 684 Ohm
Lead Channel Impedance Value: 836 Ohm
Lead Channel Impedance Value: 874 Ohm
Lead Channel Impedance Value: 988 Ohm
Lead Channel Pacing Threshold Amplitude: 0.875 V
Lead Channel Pacing Threshold Amplitude: 2 V
Lead Channel Pacing Threshold Pulse Width: 0.4 ms
Lead Channel Pacing Threshold Pulse Width: 1 ms
Lead Channel Sensing Intrinsic Amplitude: 5.25 mV
Lead Channel Sensing Intrinsic Amplitude: 5.25 mV
Lead Channel Sensing Intrinsic Amplitude: 9.25 mV
Lead Channel Sensing Intrinsic Amplitude: 9.25 mV
Lead Channel Setting Pacing Amplitude: 1.75 V
Lead Channel Setting Pacing Amplitude: 2.5 V
Lead Channel Setting Pacing Amplitude: 2.5 V
Lead Channel Setting Pacing Pulse Width: 0.6 ms
Lead Channel Setting Pacing Pulse Width: 1 ms
Lead Channel Setting Sensing Sensitivity: 4 mV
Zone Setting Status: 755011
Zone Setting Status: 755011

## 2022-03-08 ENCOUNTER — Encounter: Payer: Self-pay | Admitting: Internal Medicine

## 2022-03-08 NOTE — Progress Notes (Signed)
Outside notes received. Information abstracted. Notes sent to scan.  

## 2022-04-04 NOTE — Progress Notes (Signed)
Remote pacemaker transmission.   

## 2022-04-23 ENCOUNTER — Encounter: Payer: Self-pay | Admitting: Internal Medicine

## 2022-05-14 ENCOUNTER — Encounter: Payer: Self-pay | Admitting: Internal Medicine

## 2022-05-14 NOTE — Progress Notes (Unsigned)
Subjective:    Patient ID: William Hartman, male    DOB: 06/05/1965, 57 y.o.   MRN: [id number]     HPI William Hartman is here for a physical exam.   Redness in legs - a little worse.  Leg swelling during day, better over night.  He typically has his feet most of the day.  Does a little bit of walking during the day but not much.  Still doing some exercises at home, but not much.  He does have some healed ulcers on his lower legs-occasionally he will get a little blister and it may leak a little bit, but it does not open up completely.   Medications and allergies reviewed with patient and updated if appropriate.  Current Outpatient Medications on File Prior to Visit  Medication Sig Dispense Refill   acetaminophen (TYLENOL) 325 MG tablet Take 650 mg by mouth every 6 (six) hours as needed for mild pain or headache.     atorvastatin (LIPITOR) 20 MG tablet Take 1 tablet (20 mg total) by mouth daily. 90 tablet 3   carvedilol (COREG) 6.25 MG tablet Take 1 tablet (6.25 mg total) by mouth 2 (two) times daily with a meal. 180 tablet 3   Melatonin 3 MG CAPS Take 1 capsule by mouth daily as needed (for sleep).     telmisartan (MICARDIS) 40 MG tablet Take 1 tablet (40 mg total) by mouth daily. 90 tablet 3   No current facility-administered medications on file prior to visit.    Review of Systems  Constitutional:  Negative for fever.  Eyes:  Negative for visual disturbance.  Respiratory:  Positive for cough. Negative for shortness of breath and wheezing.   Cardiovascular:  Positive for leg swelling. Negative for chest pain and palpitations.  Gastrointestinal:  Negative for abdominal pain, blood in stool, constipation and diarrhea.       No gerd  Genitourinary:  Negative for difficulty urinating and dysuria.  Musculoskeletal:  Positive for arthralgias (right hip). Negative for back pain.  Skin:  Positive for color change (redness in legs). Negative for rash.  Neurological:  Negative for  light-headedness and headaches.  Psychiatric/Behavioral:  Negative for dysphoric mood. The patient is not nervous/anxious.        Objective:   Vitals:   05/15/22 1305  BP: 122/78  Pulse: 87  Temp: 99.7 F (37.6 C)  SpO2: 97%   There were no vitals filed for this visit. Body mass index is 28.25 kg/m.  BP Readings from Last 3 Encounters:  05/15/22 122/78  05/23/21 110/70  05/09/21 138/84    Wt Readings from Last 3 Encounters:  01/14/18 175 lb (79.4 kg)  01/08/17 185 lb (83.9 kg)  06/07/16 189 lb (85.7 kg)      Physical Exam Constitutional: He appears well-developed and well-nourished. No distress.  HENT:  Head: Normocephalic and atraumatic.  Right Ear: External ear normal.  Left Ear: External ear normal.  Mouth/Throat: Oropharynx is clear and moist.  Normal ear canals and TM b/l  Eyes: Conjunctivae and EOM are normal.  Neck: Neck supple. No tracheal deviation present. No thyromegaly present.  No carotid bruit  Cardiovascular: Normal rate, regular rhythm, normal heart sounds and intact distal pulses.  No murmur heard.   1+ bilateral pitting lower extremity edema Pulmonary/Chest: Effort normal and breath sounds normal. No respiratory distress. He has no wheezes. He has no rales.  Abdominal: Soft. He exhibits no distension. There is no tenderness.  Genitourinary: deferred  Lymphadenopathy:  He has no cervical adenopathy.  Skin: Skin is warm and dry. He is not diaphoretic.  Bilateral lower extremities near ankles there is some chronic erythema and areas of thickened, dry skin looks like healed ulcers.  At 1 scabbed area on distal medial right ankle.  Area is nontender.  Areas where you could see some blisters forming that likely come and go.  No open wounds, no active drainage. Psychiatric: He has a normal mood and affect. His behavior is normal.         Assessment & Plan:   Physical exam: Screening blood work  ordered Exercise   exercises at home, try to walk  more Weight  ok for age/condition Substance abuse   none   Reviewed recommended immunizations.   Health Maintenance  Topic Date Due   COVID-19 Vaccine (3 - 2023-24 season) 05/31/2022 (Originally 12/01/2021)   Zoster Vaccines- Shingrix (1 of 2) 08/13/2022 (Originally 10/09/2016)   Medicare Annual Wellness (AWV)  07/20/2022   DTaP/Tdap/Td (3 - Td or Tdap) 02/24/2024   Fecal DNA (Cologuard)  01/08/2025   HIV Screening  Completed   HPV VACCINES  Aged Out   INFLUENZA VACCINE  Discontinued   Hepatitis C Screening  Discontinued     See Problem List for Assessment and Plan of chronic medical problems.

## 2022-05-14 NOTE — Patient Instructions (Addendum)
Blood work was ordered.   The lab is on the first floor.    Medications changes include :   bactroban cream     Return in about 6 months (around 11/13/2022) for follow up - can be virtual.    Health Maintenance, Male Adopting a healthy lifestyle and getting preventive care are important in promoting health and wellness. Ask your health care provider about: The right schedule for you to have regular tests and exams. Things you can do on your own to prevent diseases and keep yourself healthy. What should I know about diet, weight, and exercise? Eat a healthy diet  Eat a diet that includes plenty of vegetables, fruits, low-fat dairy products, and lean protein. Do not eat a lot of foods that are high in solid fats, added sugars, or sodium. Maintain a healthy weight Body mass index (BMI) is a measurement that can be used to identify possible weight problems. It estimates body fat based on height and weight. Your health care provider can help determine your BMI and help you achieve or maintain a healthy weight. Get regular exercise Get regular exercise. This is one of the most important things you can do for your health. Most adults should: Exercise for at least 150 minutes each week. The exercise should increase your heart rate and make you sweat (moderate-intensity exercise). Do strengthening exercises at least twice a week. This is in addition to the moderate-intensity exercise. Spend less time sitting. Even light physical activity can be beneficial. Watch cholesterol and blood lipids Have your blood tested for lipids and cholesterol at 56 years of age, then have this test every 5 years. You may need to have your cholesterol levels checked more often if: Your lipid or cholesterol levels are high. You are older than 56 years of age. You are at high risk for heart disease. What should I know about cancer screening? Many types of cancers can be detected early and may often be  prevented. Depending on your health history and family history, you may need to have cancer screening at various ages. This may include screening for: Colorectal cancer. Prostate cancer. Skin cancer. Lung cancer. What should I know about heart disease, diabetes, and high blood pressure? Blood pressure and heart disease High blood pressure causes heart disease and increases the risk of stroke. This is more likely to develop in people who have high blood pressure readings or are overweight. Talk with your health care provider about your target blood pressure readings. Have your blood pressure checked: Every 3-5 years if you are 95-79 years of age. Every year if you are 64 years old or older. If you are between the ages of 87 and 9 and are a current or former smoker, ask your health care provider if you should have a one-time screening for abdominal aortic aneurysm (AAA). Diabetes Have regular diabetes screenings. This checks your fasting blood sugar level. Have the screening done: Once every three years after age 56 if you are at a normal weight and have a low risk for diabetes. More often and at a younger age if you are overweight or have a high risk for diabetes. What should I know about preventing infection? Hepatitis B If you have a higher risk for hepatitis B, you should be screened for this virus. Talk with your health care provider to find out if you are at risk for hepatitis B infection. Hepatitis C Blood testing is recommended for: Everyone born from [birth year]  through 1965. Anyone with known risk factors for hepatitis C. Sexually transmitted infections (STIs) You should be screened each year for STIs, including gonorrhea and chlamydia, if: You are sexually active and are younger than 56 years of age. You are older than 56 years of age and your health care provider tells you that you are at risk for this type of infection. Your sexual activity has changed since you were last screened,  and you are at increased risk for chlamydia or gonorrhea. Ask your health care provider if you are at risk. Ask your health care provider about whether you are at high risk for HIV. Your health care provider may recommend a prescription medicine to help prevent HIV infection. If you choose to take medicine to prevent HIV, you should first get tested for HIV. You should then be tested every 3 months for as long as you are taking the medicine. Follow these instructions at home: Alcohol use Do not drink alcohol if your health care provider tells you not to drink. If you drink alcohol: Limit how much you have to 0-2 drinks a day. Know how much alcohol is in your drink. In the [place] one drink equals one 12 oz bottle of beer (355 mL), one 5 oz glass of wine (148 mL), or one 1 oz glass of hard liquor (44 mL). Lifestyle Do not use any products that contain nicotine or tobacco. These products include cigarettes, chewing tobacco, and vaping devices, such as e-cigarettes. If you need help quitting, ask your health care provider. Do not use street drugs. Do not share needles. Ask your health care provider for help if you need support or information about quitting drugs. General instructions Schedule regular health, dental, and eye exams. Stay current with your vaccines. Tell your health care provider if: You often feel depressed. You have ever been abused or do not feel safe at home. Summary Adopting a healthy lifestyle and getting preventive care are important in promoting health and wellness. Follow your health care provider's instructions about healthy diet, exercising, and getting tested or screened for diseases. Follow your health care provider's instructions on monitoring your cholesterol and blood pressure. This information is not intended to replace advice given to you by your health care provider. Make sure you discuss any questions you have with your health care provider. Document Revised:  08/08/2020 Document Reviewed: 08/08/2020 Elsevier Patient Education  2023 [employer].

## 2022-05-15 ENCOUNTER — Other Ambulatory Visit: Payer: Self-pay | Admitting: Internal Medicine

## 2022-05-15 ENCOUNTER — Encounter: Payer: Self-pay | Admitting: Internal Medicine

## 2022-05-15 ENCOUNTER — Ambulatory Visit (INDEPENDENT_AMBULATORY_CARE_PROVIDER_SITE_OTHER): Payer: Medicare PPO | Admitting: Internal Medicine

## 2022-05-15 VITALS — BP 122/78 | HR 87 | Temp 99.7°F | Ht 66.0 in

## 2022-05-15 DIAGNOSIS — E782 Mixed hyperlipidemia: Secondary | ICD-10-CM | POA: Diagnosis not present

## 2022-05-15 DIAGNOSIS — Z125 Encounter for screening for malignant neoplasm of prostate: Secondary | ICD-10-CM

## 2022-05-15 DIAGNOSIS — Q743 Arthrogryposis multiplex congenita: Secondary | ICD-10-CM | POA: Diagnosis not present

## 2022-05-15 DIAGNOSIS — M25551 Pain in right hip: Secondary | ICD-10-CM

## 2022-05-15 DIAGNOSIS — I10 Essential (primary) hypertension: Secondary | ICD-10-CM

## 2022-05-15 DIAGNOSIS — Z8679 Personal history of other diseases of the circulatory system: Secondary | ICD-10-CM

## 2022-05-15 DIAGNOSIS — Z0000 Encounter for general adult medical examination without abnormal findings: Secondary | ICD-10-CM

## 2022-05-15 DIAGNOSIS — R7303 Prediabetes: Secondary | ICD-10-CM | POA: Diagnosis not present

## 2022-05-15 DIAGNOSIS — R600 Localized edema: Secondary | ICD-10-CM

## 2022-05-15 LAB — COMPREHENSIVE METABOLIC PANEL
ALT: 25 U/L (ref 0–53)
AST: 16 U/L (ref 0–37)
Albumin: 4.2 g/dL (ref 3.5–5.2)
Alkaline Phosphatase: 67 U/L (ref 39–117)
BUN: 23 mg/dL (ref 6–23)
CO2: 23 mEq/L (ref 19–32)
Calcium: 9.7 mg/dL (ref 8.4–10.5)
Chloride: 104 mEq/L (ref 96–112)
Creatinine, Ser: 0.88 mg/dL (ref 0.40–1.50)
GFR: 96.74 mL/min (ref >60.00)
Glucose, Bld: 117 mg/dL — ABNORMAL HIGH (ref 70–99)
Potassium: 4.4 mEq/L (ref 3.5–5.1)
Sodium: 138 mEq/L (ref 135–145)
Total Bilirubin: 0.5 mg/dL (ref 0.2–1.2)
Total Protein: 7.8 g/dL (ref 6.0–8.3)

## 2022-05-15 LAB — CBC WITH DIFFERENTIAL/PLATELET
Basophils Absolute: 0.1 10*3/uL (ref 0.0–0.1)
Basophils Relative: 0.4 % (ref 0.0–3.0)
Eosinophils Absolute: 0.2 10*3/uL (ref 0.0–0.7)
Eosinophils Relative: 1.9 % (ref 0.0–5.0)
HCT: 43 % (ref 39.0–52.0)
Hemoglobin: 14.4 g/dL (ref 13.0–17.0)
Lymphocytes Relative: 10.2 % — ABNORMAL LOW (ref 12.0–46.0)
Lymphs Abs: 1.3 10*3/uL (ref 0.7–4.0)
MCHC: 33.5 g/dL (ref 30.0–36.0)
MCV: 94.4 fl (ref 78.0–100.0)
Monocytes Absolute: 0.8 10*3/uL (ref 0.1–1.0)
Monocytes Relative: 6 % (ref 3.0–12.0)
Neutro Abs: 10.5 10*3/uL — ABNORMAL HIGH (ref 1.4–7.7)
Neutrophils Relative %: 81.5 % — ABNORMAL HIGH (ref 43.0–77.0)
Platelets: 346 10*3/uL (ref 150.0–400.0)
RBC: 4.55 Mil/uL (ref 4.22–5.81)
RDW: 14.1 % (ref 11.5–15.5)
WBC: 12.9 10*3/uL — ABNORMAL HIGH (ref 4.0–10.5)

## 2022-05-15 LAB — LIPID PANEL
Cholesterol: 147 mg/dL (ref 0–200)
HDL: 34.1 mg/dL — ABNORMAL LOW (ref >39.00)
NonHDL: 112.65
Total CHOL/HDL Ratio: 4
Triglycerides: 279 mg/dL — ABNORMAL HIGH (ref 0.0–149.0)
VLDL: 55.8 mg/dL — ABNORMAL HIGH (ref 0.0–40.0)

## 2022-05-15 LAB — TSH: TSH: 1.12 u[IU]/mL (ref 0.35–5.50)

## 2022-05-15 LAB — LDL CHOLESTEROL, DIRECT: Direct LDL: 82 mg/dL

## 2022-05-15 LAB — PSA, MEDICARE: PSA: 1.22 ng/ml (ref 0.10–4.00)

## 2022-05-15 LAB — HEMOGLOBIN A1C: Hgb A1c MFr Bld: 5.7 % (ref 4.6–6.5)

## 2022-05-15 MED ORDER — MUPIROCIN CALCIUM 2 % EX CREA: 1.0000 | TOPICAL_CREAM | Freq: Two times a day (BID) | CUTANEOUS | 5 refills | Status: DC

## 2022-05-15 NOTE — Assessment & Plan Note (Signed)
Chronic On disability Stressed regular exercise and staying as active as possible

## 2022-05-15 NOTE — Assessment & Plan Note (Addendum)
Chronic Regular exercise and healthy diet encouraged Check lipid panel, CMP, TSH Continue atorvastatin 20 mg daily

## 2022-05-15 NOTE — Assessment & Plan Note (Addendum)
Chronic Has several healed areas of likely venous stasis ulcers bilateral lower extremities No open wounds at this time Stressed improving lower extremity edema-elevating legs more and walking more during the day which may improvement the ulcers Can apply Bactroban cream as needed  Can apply Bactroban cream as needed

## 2022-05-15 NOTE — Assessment & Plan Note (Addendum)
Chronic Blood pressure well controlled CMP, CBC Continue carvedilol 6.25 mg twice daily, telmisartan 40 mg daily

## 2022-05-15 NOTE — Assessment & Plan Note (Signed)
Chronic This is inhibiting some of his activity May need to follow-up with orthopedics Can consider PT again

## 2022-05-15 NOTE — Assessment & Plan Note (Addendum)
Chronic Not currently on any medication Not ideally controlled edema and redness have worsened Likely venous insufficiency with some dermatitis and healed ulcers Stressed that he needs to elevate his legs more during the day-currently sitting with the legs down most of the day Also needs to be doing more walking throughout the day Monitor healed ulcers and blisters on lower extremities closely for infection If legs or not improving or worsen he will let me know-could consider diuretic

## 2022-05-15 NOTE — Telephone Encounter (Signed)
Pharmacy sent msg stating we only stock the mupirocin ointment not the cream okay to substitute? ..William Hartman

## 2022-05-15 NOTE — Assessment & Plan Note (Signed)
Chronic Check a1c Low sugar / carb diet Stressed regular exercise  

## 2022-05-21 ENCOUNTER — Encounter: Payer: Medicare PPO | Admitting: Internal Medicine

## 2022-06-05 ENCOUNTER — Ambulatory Visit (INDEPENDENT_AMBULATORY_CARE_PROVIDER_SITE_OTHER): Payer: Medicare PPO

## 2022-06-05 DIAGNOSIS — I442 Atrioventricular block, complete: Secondary | ICD-10-CM

## 2022-06-05 LAB — CUP PACEART REMOTE DEVICE CHECK
Battery Remaining Longevity: 8 mo
Battery Voltage: 2.71 V
Brady Statistic AP VP Percent: 31.34 %
Brady Statistic AP VS Percent: 0.01 %
Brady Statistic AS VP Percent: 68.64 %
Brady Statistic AS VS Percent: 0.01 %
Brady Statistic RA Percent Paced: 31.29 %
Brady Statistic RV Percent Paced: 99.97 %
Date Time Interrogation Session: 20240305013137
Implantable Lead Connection Status: 753985
Implantable Lead Connection Status: 753985
Implantable Lead Connection Status: 753985
Implantable Lead Implant Date: 20130401
Implantable Lead Implant Date: 20130401
Implantable Lead Implant Date: 20171124
Implantable Lead Location: 753858
Implantable Lead Location: 753859
Implantable Lead Location: 753860
Implantable Lead Model: 4598
Implantable Lead Model: 5076
Implantable Lead Model: 5076
Implantable Pulse Generator Implant Date: 20171124
Lead Channel Impedance Value: 1026 Ohm
Lead Channel Impedance Value: 1045 Ohm
Lead Channel Impedance Value: 304 Ohm
Lead Channel Impedance Value: 418 Ohm
Lead Channel Impedance Value: 437 Ohm
Lead Channel Impedance Value: 456 Ohm
Lead Channel Impedance Value: 456 Ohm
Lead Channel Impedance Value: 589 Ohm
Lead Channel Impedance Value: 608 Ohm
Lead Channel Impedance Value: 627 Ohm
Lead Channel Impedance Value: 665 Ohm
Lead Channel Impedance Value: 874 Ohm
Lead Channel Impedance Value: 893 Ohm
Lead Channel Impedance Value: 912 Ohm
Lead Channel Pacing Threshold Amplitude: 0.875 V
Lead Channel Pacing Threshold Amplitude: 1.875 V
Lead Channel Pacing Threshold Pulse Width: 0.4 ms
Lead Channel Pacing Threshold Pulse Width: 1 ms
Lead Channel Sensing Intrinsic Amplitude: 5.5 mV
Lead Channel Sensing Intrinsic Amplitude: 5.5 mV
Lead Channel Sensing Intrinsic Amplitude: 9.25 mV
Lead Channel Sensing Intrinsic Amplitude: 9.25 mV
Lead Channel Setting Pacing Amplitude: 1.75 V
Lead Channel Setting Pacing Amplitude: 2.5 V
Lead Channel Setting Pacing Amplitude: 2.5 V
Lead Channel Setting Pacing Pulse Width: 0.6 ms
Lead Channel Setting Pacing Pulse Width: 1 ms
Lead Channel Setting Sensing Sensitivity: 4 mV
Zone Setting Status: 755011
Zone Setting Status: 755011

## 2022-06-19 ENCOUNTER — Encounter: Payer: Self-pay | Admitting: Internal Medicine

## 2022-06-19 ENCOUNTER — Ambulatory Visit: Payer: Medicare PPO | Attending: Internal Medicine | Admitting: Internal Medicine

## 2022-06-19 VITALS — BP 112/74 | HR 162 | Ht 66.0 in | Wt 175.0 lb

## 2022-06-19 DIAGNOSIS — I420 Dilated cardiomyopathy: Secondary | ICD-10-CM

## 2022-06-19 DIAGNOSIS — Z950 Presence of cardiac pacemaker: Secondary | ICD-10-CM

## 2022-06-19 DIAGNOSIS — I442 Atrioventricular block, complete: Secondary | ICD-10-CM

## 2022-06-19 LAB — CUP PACEART INCLINIC DEVICE CHECK
Battery Remaining Longevity: 7 mo
Battery Voltage: 2.69 V
Brady Statistic AP VP Percent: 36.05 %
Brady Statistic AP VS Percent: 0.01 %
Brady Statistic AS VP Percent: 63.9 %
Brady Statistic AS VS Percent: 0.05 %
Brady Statistic RA Percent Paced: 36.02 %
Brady Statistic RV Percent Paced: 99.94 %
Date Time Interrogation Session: 20240319165029
Implantable Lead Connection Status: 753985
Implantable Lead Connection Status: 753985
Implantable Lead Connection Status: 753985
Implantable Lead Implant Date: 20130401
Implantable Lead Implant Date: 20130401
Implantable Lead Implant Date: 20171124
Implantable Lead Location: 753858
Implantable Lead Location: 753859
Implantable Lead Location: 753860
Implantable Lead Model: 4598
Implantable Lead Model: 5076
Implantable Lead Model: 5076
Implantable Pulse Generator Implant Date: 20171124
Lead Channel Impedance Value: 1007 Ohm
Lead Channel Impedance Value: 1064 Ohm
Lead Channel Impedance Value: 323 Ohm
Lead Channel Impedance Value: 418 Ohm
Lead Channel Impedance Value: 437 Ohm
Lead Channel Impedance Value: 456 Ohm
Lead Channel Impedance Value: 513 Ohm
Lead Channel Impedance Value: 627 Ohm
Lead Channel Impedance Value: 646 Ohm
Lead Channel Impedance Value: 646 Ohm
Lead Channel Impedance Value: 703 Ohm
Lead Channel Impedance Value: 950 Ohm
Lead Channel Impedance Value: 969 Ohm
Lead Channel Impedance Value: 969 Ohm
Lead Channel Pacing Threshold Amplitude: 0.875 V
Lead Channel Pacing Threshold Amplitude: 1.75 V
Lead Channel Pacing Threshold Pulse Width: 0.4 ms
Lead Channel Pacing Threshold Pulse Width: 1 ms
Lead Channel Sensing Intrinsic Amplitude: 5 mV
Lead Channel Sensing Intrinsic Amplitude: 5.75 mV
Lead Channel Sensing Intrinsic Amplitude: 9.25 mV
Lead Channel Sensing Intrinsic Amplitude: 9.25 mV
Lead Channel Setting Pacing Amplitude: 1.75 V
Lead Channel Setting Pacing Amplitude: 2.5 V
Lead Channel Setting Pacing Amplitude: 2.5 V
Lead Channel Setting Pacing Pulse Width: 0.6 ms
Lead Channel Setting Pacing Pulse Width: 1 ms
Lead Channel Setting Sensing Sensitivity: 4 mV
Zone Setting Status: 755011
Zone Setting Status: 755011
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
## 2022-06-19 NOTE — Addendum Note (Signed)
Addended by: Deboraha Sprang on: 06/19/2022 06:55 PM   Modules accepted: Level of Service

## 2022-06-19 NOTE — Patient Instructions (Signed)
Medication Instructions:  Your physician recommends that you continue on your current medications as directed. Please refer to the Current Medication list given to you today.  *If you need a refill on your cardiac medications before your next appointment, please call your pharmacy*   Lab Work: None ordered.  If you have labs (blood work) drawn today and your tests are completely normal, you will receive your results only by: [place] (if you have MyChart) OR A paper copy in the mail If you have any lab test that is abnormal or we need to change your treatment, we will call you to review the results.   Testing/Procedures: None ordered.    Follow-Up: At [facility], you and your health needs are our priority.  As part of our continuing mission to provide you with exceptional heart care, we have created designated Provider Care Teams.  These Care Teams include your primary Cardiologist (physician) and Advanced Practice Providers (APPs -  Physician Assistants and Nurse Practitioners) who all work together to provide you with the care you need, when you need it.  We recommend signing up for the patient portal called "MyChart".  Sign up information is provided on this After Visit Summary.  MyChart is used to connect with patients for Virtual Visits (Telemedicine).  Patients are able to view lab/test results, encounter notes, upcoming appointments, etc.  Non-urgent messages can be sent to your provider as well.   To learn more about what you can do with MyChart, go to [email or web address].    Your next appointment:   6 months with Dr Olin Pia PA

## 2022-07-07 ENCOUNTER — Other Ambulatory Visit: Payer: Self-pay | Admitting: Internal Medicine

## 2022-07-11 NOTE — Progress Notes (Signed)
Remote pacemaker transmission.   

## 2022-07-18 ENCOUNTER — Telehealth: Payer: Self-pay

## 2022-07-18 NOTE — Telephone Encounter (Signed)
Contacted William Hartman to schedule their annual wellness visit. Appointment made for 07/23/22.  William Hartman, CMA (AAMA)  [facility] [phone]

## 2022-07-23 ENCOUNTER — Ambulatory Visit (INDEPENDENT_AMBULATORY_CARE_PROVIDER_SITE_OTHER): Payer: Medicare PPO

## 2022-07-23 VITALS — Ht 66.0 in | Wt 175.0 lb

## 2022-07-23 DIAGNOSIS — Z0000 Encounter for general adult medical examination without abnormal findings: Secondary | ICD-10-CM | POA: Diagnosis not present

## 2022-07-23 NOTE — Patient Instructions (Addendum)
William Hartman , Thank you for taking time to come for your Medicare Wellness Visit. I appreciate your ongoing commitment to your health goals. Please review the following plan we discussed and let me know if I can assist you in the future.   These are the goals we discussed:  Goals      My goal is to get more mobility.        This is a list of the screening recommended for you and due dates:  Health Maintenance  Topic Date Due   COVID-19 Vaccine (3 - 2023-24 season) 12/01/2021   Zoster (Shingles) Vaccine (1 of 2) 08/13/2022*   Medicare Annual Wellness Visit  07/23/2023   DTaP/Tdap/Td vaccine (3 - Td or Tdap) 02/24/2024   Cologuard (Stool DNA test)  01/08/2025   HIV Screening  Completed   HPV Vaccine  Aged Out   Flu Shot  Discontinued   Hepatitis C Screening: USPSTF Recommendation to screen - Ages 18-79 yo.  Discontinued  *Topic was postponed. The date shown is not the original due date.    Advanced directives: No  Conditions/risks identified: Yes  Next appointment: Follow up in one year for your annual wellness visit.  Preventive Care 40-64 Years, Male Preventive care refers to lifestyle choices and visits with your health care provider that can promote health and wellness. What does preventive care include? A yearly physical exam. This is also called an annual well check. Dental exams once or twice a year. Routine eye exams. Ask your health care provider how often you should have your eyes checked. Personal lifestyle choices, including: Daily care of your teeth and gums. Regular physical activity. Eating a healthy diet. Avoiding tobacco and drug use. Limiting alcohol use. Practicing safe sex. Taking low-dose aspirin every day starting at age 44. What happens during an annual well check? The services and screenings done by your health care provider during your annual well check will depend on your age, overall health, lifestyle risk factors, and family history of  disease. Counseling  Your health care provider may ask you questions about your: Alcohol use. Tobacco use. Drug use. Emotional well-being. Home and relationship well-being. Sexual activity. Eating habits. Work and work Astronomer. Screening  You may have the following tests or measurements: Height, weight, and BMI. Blood pressure. Lipid and cholesterol levels. These may be checked every 5 years, or more frequently if you are over 1 years old. Skin check. Lung cancer screening. You may have this screening every year starting at age 35 if you have a 30-pack-year history of smoking and currently smoke or have quit within the past 15 years. Fecal occult blood test (FOBT) of the stool. You may have this test every year starting at age 35. Flexible sigmoidoscopy or colonoscopy. You may have a sigmoidoscopy every 5 years or a colonoscopy every 10 years starting at age 36. Prostate cancer screening. Recommendations will vary depending on your family history and other risks. Hepatitis C blood test. Hepatitis B blood test. Sexually transmitted disease (STD) testing. Diabetes screening. This is done by checking your blood sugar (glucose) after you have not eaten for a while (fasting). You may have this done every 1-3 years. Discuss your test results, treatment options, and if necessary, the need for more tests with your health care provider. Vaccines  Your health care provider may recommend certain vaccines, such as: Influenza vaccine. This is recommended every year. Tetanus, diphtheria, and acellular pertussis (Tdap, Td) vaccine. You may need a Td booster every  10 years. Zoster vaccine. You may need this after age 59. Pneumococcal 13-valent conjugate (PCV13) vaccine. You may need this if you have certain conditions and have not been vaccinated. Pneumococcal polysaccharide (PPSV23) vaccine. You may need one or two doses if you smoke cigarettes or if you have certain conditions. Talk to your  health care provider about which screenings and vaccines you need and how often you need them. This information is not intended to replace advice given to you by your health care provider. Make sure you discuss any questions you have with your health care provider. Document Released: 04/15/2015 Document Revised: 12/07/2015 Document Reviewed: 01/18/2015 Elsevier Interactive Patient Education  2017 [place] Prevention in the Home Falls can cause injuries. They can happen to people of all ages. There are many things you can do to make your home safe and to help prevent falls. What can I do on the outside of my home? Regularly fix the edges of walkways and driveways and fix any cracks. Remove anything that might make you trip as you walk through a door, such as a raised step or threshold. Trim any bushes or trees on the path to your home. Use bright outdoor lighting. Clear any walking paths of anything that might make someone trip, such as rocks or tools. Regularly check to see if handrails are loose or broken. Make sure that both sides of any steps have handrails. Any raised decks and porches should have guardrails on the edges. Have any leaves, snow, or ice cleared regularly. Use sand or salt on walking paths during winter. Clean up any spills in your garage right away. This includes oil or grease spills. What can I do in the bathroom? Use night lights. Install grab bars by the toilet and in the tub and shower. Do not use towel bars as grab bars. Use non-skid mats or decals in the tub or shower. If you need to sit down in the shower, use a plastic, non-slip stool. Keep the floor dry. Clean up any water that spills on the floor as soon as it happens. Remove soap buildup in the tub or shower regularly. Attach bath mats securely with double-sided non-slip rug tape. Do not have throw rugs and other things on the floor that can make you trip. What can I do in the bedroom? Use night  lights. Make sure that you have a light by your bed that is easy to reach. Do not use any sheets or blankets that are too big for your bed. They should not hang down onto the floor. Have a firm chair that has side arms. You can use this for support while you get dressed. Do not have throw rugs and other things on the floor that can make you trip. What can I do in the kitchen? Clean up any spills right away. Avoid walking on wet floors. Keep items that you use a lot in easy-to-reach places. If you need to reach something above you, use a strong step stool that has a grab bar. Keep electrical cords out of the way. Do not use floor polish or wax that makes floors slippery. If you must use wax, use non-skid floor wax. Do not have throw rugs and other things on the floor that can make you trip. What can I do with my stairs? Do not leave any items on the stairs. Make sure that there are handrails on both sides of the stairs and use them. Fix handrails that are broken  or loose. Make sure that handrails are as long as the stairways. Check any carpeting to make sure that it is firmly attached to the stairs. Fix any carpet that is loose or worn. Avoid having throw rugs at the top or bottom of the stairs. If you do have throw rugs, attach them to the floor with carpet tape. Make sure that you have a light switch at the top of the stairs and the bottom of the stairs. If you do not have them, ask someone to add them for you. What else can I do to help prevent falls? Wear shoes that: Do not have high heels. Have rubber bottoms. Are comfortable and fit you well. Are closed at the toe. Do not wear sandals. If you use a stepladder: Make sure that it is fully opened. Do not climb a closed stepladder. Make sure that both sides of the stepladder are locked into place. Ask someone to hold it for you, if possible. Clearly mark and make sure that you can see: Any grab bars or handrails. First and last  steps. Where the edge of each step is. Use tools that help you move around (mobility aids) if they are needed. These include: Canes. Walkers. Scooters. Crutches. Turn on the lights when you go into a dark area. Replace any light bulbs as soon as they burn out. Set up your furniture so you have a clear path. Avoid moving your furniture around. If any of your floors are uneven, fix them. If there are any pets around you, be aware of where they are. Review your medicines with your doctor. Some medicines can make you feel dizzy. This can increase your chance of falling. Ask your doctor what other things that you can do to help prevent falls. This information is not intended to replace advice given to you by your health care provider. Make sure you discuss any questions you have with your health care provider. Document Released: 01/13/2009 Document Revised: 08/25/2015 Document Reviewed: 04/23/2014 Elsevier Interactive Patient Education  2017 [employer].
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
## 2022-08-02 ENCOUNTER — Ambulatory Visit (INDEPENDENT_AMBULATORY_CARE_PROVIDER_SITE_OTHER): Payer: Medicare PPO

## 2022-08-02 DIAGNOSIS — I442 Atrioventricular block, complete: Secondary | ICD-10-CM

## 2022-08-02 LAB — CUP PACEART REMOTE DEVICE CHECK
Battery Remaining Longevity: 5 mo
Battery Voltage: 2.65 V
Brady Statistic AP VP Percent: 28.95 %
Brady Statistic AP VS Percent: 0.01 %
Brady Statistic AS VP Percent: 71.02 %
Brady Statistic AS VS Percent: 0.02 %
Brady Statistic RA Percent Paced: 28.91 %
Brady Statistic RV Percent Paced: 99.97 %
Date Time Interrogation Session: 20240502023252
Implantable Lead Connection Status: 753985
Implantable Lead Connection Status: 753985
Implantable Lead Connection Status: 753985
Implantable Lead Implant Date: 20130401
Implantable Lead Implant Date: 20130401
Implantable Lead Implant Date: 20171124
Implantable Lead Location: 753858
Implantable Lead Location: 753859
Implantable Lead Location: 753860
Implantable Lead Model: 4598
Implantable Lead Model: 5076
Implantable Lead Model: 5076
Implantable Pulse Generator Implant Date: 20171124
Lead Channel Impedance Value: 1045 Ohm
Lead Channel Impedance Value: 1064 Ohm
Lead Channel Impedance Value: 323 Ohm
Lead Channel Impedance Value: 418 Ohm
Lead Channel Impedance Value: 437 Ohm
Lead Channel Impedance Value: 456 Ohm
Lead Channel Impedance Value: 475 Ohm
Lead Channel Impedance Value: 589 Ohm
Lead Channel Impedance Value: 627 Ohm
Lead Channel Impedance Value: 665 Ohm
Lead Channel Impedance Value: 684 Ohm
Lead Channel Impedance Value: 874 Ohm
Lead Channel Impedance Value: 912 Ohm
Lead Channel Impedance Value: 950 Ohm
Lead Channel Pacing Threshold Amplitude: 0.875 V
Lead Channel Pacing Threshold Amplitude: 2 V
Lead Channel Pacing Threshold Pulse Width: 0.4 ms
Lead Channel Pacing Threshold Pulse Width: 1 ms
Lead Channel Sensing Intrinsic Amplitude: 5.875 mV
Lead Channel Sensing Intrinsic Amplitude: 5.875 mV
Lead Channel Sensing Intrinsic Amplitude: 9.25 mV
Lead Channel Sensing Intrinsic Amplitude: 9.25 mV
Lead Channel Setting Pacing Amplitude: 1.75 V
Lead Channel Setting Pacing Amplitude: 2.5 V
Lead Channel Setting Pacing Amplitude: 2.5 V
Lead Channel Setting Pacing Pulse Width: 0.6 ms
Lead Channel Setting Pacing Pulse Width: 1 ms
Lead Channel Setting Sensing Sensitivity: 4 mV
Zone Setting Status: 755011
Zone Setting Status: 755011

## 2022-08-22 NOTE — Progress Notes (Signed)
Remote pacemaker transmission.   

## 2022-09-04 ENCOUNTER — Ambulatory Visit (INDEPENDENT_AMBULATORY_CARE_PROVIDER_SITE_OTHER): Payer: Medicare PPO

## 2022-09-04 DIAGNOSIS — I442 Atrioventricular block, complete: Secondary | ICD-10-CM | POA: Diagnosis not present

## 2022-09-04 LAB — CUP PACEART REMOTE DEVICE CHECK
Battery Remaining Longevity: 5 mo
Battery Voltage: 2.63 V
Brady Statistic AP VP Percent: 27.3 %
Brady Statistic AP VS Percent: 0.01 %
Brady Statistic AS VP Percent: 72.65 %
Brady Statistic AS VS Percent: 0.04 %
Brady Statistic RA Percent Paced: 27.25 %
Brady Statistic RV Percent Paced: 99.95 %
Date Time Interrogation Session: 20240604023036
Implantable Lead Connection Status: 753985
Implantable Lead Connection Status: 753985
Implantable Lead Connection Status: 753985
Implantable Lead Implant Date: 20130401
Implantable Lead Implant Date: 20130401
Implantable Lead Implant Date: 20171124
Implantable Lead Location: 753858
Implantable Lead Location: 753859
Implantable Lead Location: 753860
Implantable Lead Model: 4598
Implantable Lead Model: 5076
Implantable Lead Model: 5076
Implantable Pulse Generator Implant Date: 20171124
Lead Channel Impedance Value: 1026 Ohm
Lead Channel Impedance Value: 1083 Ohm
Lead Channel Impedance Value: 323 Ohm
Lead Channel Impedance Value: 437 Ohm
Lead Channel Impedance Value: 456 Ohm
Lead Channel Impedance Value: 456 Ohm
Lead Channel Impedance Value: 475 Ohm
Lead Channel Impedance Value: 570 Ohm
Lead Channel Impedance Value: 627 Ohm
Lead Channel Impedance Value: 646 Ohm
Lead Channel Impedance Value: 684 Ohm
Lead Channel Impedance Value: 855 Ohm
Lead Channel Impedance Value: 912 Ohm
Lead Channel Impedance Value: 950 Ohm
Lead Channel Pacing Threshold Amplitude: 0.875 V
Lead Channel Pacing Threshold Amplitude: 2 V
Lead Channel Pacing Threshold Pulse Width: 0.4 ms
Lead Channel Pacing Threshold Pulse Width: 1 ms
Lead Channel Sensing Intrinsic Amplitude: 5.625 mV
Lead Channel Sensing Intrinsic Amplitude: 5.625 mV
Lead Channel Sensing Intrinsic Amplitude: 9.25 mV
Lead Channel Sensing Intrinsic Amplitude: 9.25 mV
Lead Channel Setting Pacing Amplitude: 1.75 V
Lead Channel Setting Pacing Amplitude: 2.5 V
Lead Channel Setting Pacing Amplitude: 2.5 V
Lead Channel Setting Pacing Pulse Width: 0.6 ms
Lead Channel Setting Pacing Pulse Width: 1 ms
Lead Channel Setting Sensing Sensitivity: 4 mV
Zone Setting Status: 755011
Zone Setting Status: 755011

## 2022-09-27 NOTE — Progress Notes (Signed)
Remote pacemaker transmission.   

## 2022-10-05 ENCOUNTER — Ambulatory Visit (INDEPENDENT_AMBULATORY_CARE_PROVIDER_SITE_OTHER): Payer: Medicare PPO

## 2022-10-05 DIAGNOSIS — I442 Atrioventricular block, complete: Secondary | ICD-10-CM

## 2022-10-05 LAB — CUP PACEART REMOTE DEVICE CHECK
Battery Remaining Longevity: 4 mo
Battery Voltage: 2.62 V
Brady Statistic AP VP Percent: 20.03 %
Brady Statistic AP VS Percent: 0.01 %
Brady Statistic AS VP Percent: 79.82 %
Brady Statistic AS VS Percent: 0.14 %
Brady Statistic RA Percent Paced: 20 %
Brady Statistic RV Percent Paced: 99.85 %
Date Time Interrogation Session: 20240705023253
Implantable Lead Connection Status: 753985
Implantable Lead Connection Status: 753985
Implantable Lead Connection Status: 753985
Implantable Lead Implant Date: 20130401
Implantable Lead Implant Date: 20130401
Implantable Lead Implant Date: 20171124
Implantable Lead Location: 753858
Implantable Lead Location: 753859
Implantable Lead Location: 753860
Implantable Lead Model: 4598
Implantable Lead Model: 5076
Implantable Lead Model: 5076
Implantable Pulse Generator Implant Date: 20171124
Lead Channel Impedance Value: 1045 Ohm
Lead Channel Impedance Value: 1045 Ohm
Lead Channel Impedance Value: 323 Ohm
Lead Channel Impedance Value: 399 Ohm
Lead Channel Impedance Value: 418 Ohm
Lead Channel Impedance Value: 456 Ohm
Lead Channel Impedance Value: 456 Ohm
Lead Channel Impedance Value: 551 Ohm
Lead Channel Impedance Value: 608 Ohm
Lead Channel Impedance Value: 627 Ohm
Lead Channel Impedance Value: 684 Ohm
Lead Channel Impedance Value: 855 Ohm
Lead Channel Impedance Value: 893 Ohm
Lead Channel Impedance Value: 931 Ohm
Lead Channel Pacing Threshold Amplitude: 1 V
Lead Channel Pacing Threshold Amplitude: 1.875 V
Lead Channel Pacing Threshold Pulse Width: 0.4 ms
Lead Channel Pacing Threshold Pulse Width: 1 ms
Lead Channel Sensing Intrinsic Amplitude: 5.625 mV
Lead Channel Sensing Intrinsic Amplitude: 5.625 mV
Lead Channel Sensing Intrinsic Amplitude: 9.25 mV
Lead Channel Sensing Intrinsic Amplitude: 9.25 mV
Lead Channel Setting Pacing Amplitude: 2 V
Lead Channel Setting Pacing Amplitude: 2.5 V
Lead Channel Setting Pacing Amplitude: 2.5 V
Lead Channel Setting Pacing Pulse Width: 0.6 ms
Lead Channel Setting Pacing Pulse Width: 1 ms
Lead Channel Setting Sensing Sensitivity: 4 mV
Zone Setting Status: 755011
Zone Setting Status: 755011

## 2022-10-24 NOTE — Progress Notes (Signed)
Remote pacemaker transmission.   

## 2022-11-05 ENCOUNTER — Ambulatory Visit: Payer: Medicare PPO

## 2022-11-05 ENCOUNTER — Encounter: Payer: Self-pay | Admitting: Internal Medicine

## 2022-11-05 DIAGNOSIS — I442 Atrioventricular block, complete: Secondary | ICD-10-CM

## 2022-11-05 NOTE — Progress Notes (Signed)
76   Subjective:    Patient ID: William Hartman, male    DOB: 1965/05/05, 57 y.o.   MRN: [id number]      HPI William Hartman is here for No chief complaint on file.   DMV paperwork - no accidents but went for renewal and was advised he needed medical clearance   Not progressive  ROm 25%, strength 40%  No devices used at home to ambulate Out side for long distance - wheelchair/power chair   Uses steering knob when driving   Medications and allergies reviewed with patient and updated if appropriate.  Current Outpatient Medications on File Prior to Visit  Medication Sig Dispense Refill   acetaminophen (TYLENOL) 325 MG tablet Take 650 mg by mouth every 6 (six) hours as needed for mild pain or headache.     atorvastatin (LIPITOR) 20 MG tablet Take 1 tablet (20 mg total) by mouth daily. 90 tablet 3   carvedilol (COREG) 6.25 MG tablet Take 1 tablet (6.25 mg total) by mouth 2 (two) times daily with a meal. 180 tablet 3   Melatonin 3 MG CAPS Take 1 capsule by mouth daily as needed (for sleep).     mupirocin ointment (BACTROBAN) 2 % apply ONE application topically TWO times a DAY 22 g 5   telmisartan (MICARDIS) 40 MG tablet Take 1 tablet (40 mg total) by mouth daily. 90 tablet 3   No current facility-administered medications on file prior to visit.    Review of Systems     Objective:   Vitals:   11/06/22 1528  BP: 116/80  Pulse: 76  Temp: 98.2 F (36.8 C)  SpO2: 98%   BP Readings from Last 3 Encounters:  11/06/22 116/80  06/19/22 112/74  05/15/22 122/78   Wt Readings from Last 3 Encounters:  07/23/22 175 lb (79.4 kg)  06/19/22 175 lb (79.4 kg)  01/14/18 175 lb (79.4 kg)   Body mass index is 28.25 kg/m.    Physical Exam Constitutional:      General: He is not in acute distress.    Appearance: Normal appearance. He is not ill-appearing.  Musculoskeletal:     Comments: Contractures of wrists, fingers, shoulders, knees, feet  ROM 25% of extremities - LUE and LLE slightly  better than right side  Strength - 40%  Skin:    General: Skin is warm and dry.  Neurological:     Mental Status: He is alert.  Psychiatric:        Mood and Affect: Mood normal.            Assessment & Plan:    See Problem List for Assessment and Plan of chronic medical problems.

## 2022-11-06 ENCOUNTER — Ambulatory Visit: Payer: Medicare PPO | Admitting: Internal Medicine

## 2022-11-06 VITALS — BP 116/80 | HR 76 | Temp 98.2°F | Ht 66.0 in

## 2022-11-06 DIAGNOSIS — Q743 Arthrogryposis multiplex congenita: Secondary | ICD-10-CM

## 2022-11-06 DIAGNOSIS — I10 Essential (primary) hypertension: Secondary | ICD-10-CM | POA: Diagnosis not present

## 2022-11-06 NOTE — Patient Instructions (Signed)
Form to be filled out.

## 2022-11-06 NOTE — Assessment & Plan Note (Signed)
Chronic Blood pressure well controlled Continue carvedilol 6.25 mg twice daily, telmisartan 40 mg daily

## 2022-11-08 ENCOUNTER — Encounter: Payer: Self-pay | Admitting: Internal Medicine

## 2022-11-12 ENCOUNTER — Encounter: Payer: Self-pay | Admitting: Internal Medicine

## 2022-11-12 NOTE — Patient Instructions (Addendum)
       Return in about 6 months (around 05/16/2023) for follow up.

## 2022-11-12 NOTE — Progress Notes (Unsigned)
Virtual Visit via Video Note  I connected with William Hartman on 11/12/22 at  1:00 PM EDT by a video enabled telemedicine application and verified that I am speaking with the correct person using two identifiers.   I discussed the limitations of evaluation and management by telemedicine and the availability of in person appointments. The patient expressed understanding and agreed to proceed.  Present for the visit:  Myself, Dr Cheryll Cockayne, Caro Laroche.  The patient is currently at home and I am in the office.    No referring provider.    History of Present Illness: He is here for follow up of his chronic medical conditions.    He is taking all of his medications as prescribed.  He is exercising 4 days a week - 20-25 minutes.    No concerns.    Review of Systems  Constitutional:  Negative for fever.  Respiratory:  Positive for cough (occ, dry). Negative for shortness of breath and wheezing.   Cardiovascular:  Positive for leg swelling (controlled - improves over night). Negative for chest pain and palpitations.  Neurological:  Negative for dizziness and headaches.      Social History   Socioeconomic History   Marital status: Married    Spouse name: Not on file   Number of children: Not on file   Years of education: Not on file   Highest education level: Master's degree (e.g., MA, MS, MEng, MEd, MSW, MBA)  Occupational History   Not on file  Tobacco Use   Smoking status: Never   Smokeless tobacco: Never  Vaping Use   Vaping status: Never Used  Substance and Sexual Activity   Alcohol use: Yes    Alcohol/week: 1.0 standard drink of alcohol    Types: 1 Glasses of wine per week    Comment: "not much"   Drug use: No   Sexual activity: Yes    Comment: He lives in [place] with his wife. 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 He has limited mobility, but is able to walk for routine things if needs to be up for long while, uses a wheelchair  or scooter  Other Topics Concern   Not on file  Social History Narrative   Not on file   Social Determinants of Health   Financial Resource Strain: Low Risk  (11/03/2022)   Overall Financial Resource Strain (CARDIA)    Difficulty of Paying Living Expenses: Not hard at all  Food Insecurity: No Food Insecurity (11/03/2022)   Hunger Vital Sign    Worried About Running Out of Food in the Last Year: Never true    Ran Out of Food in the Last Year: Never true  Transportation Needs: No Transportation Needs (11/03/2022)   PRAPARE - Administrator, Civil Service (Medical): No    Lack of Transportation (Non-Medical): No  Physical Activity: Insufficiently Active (11/03/2022)   Exercise Vital Sign    Days of Exercise per Week: 4 days    Minutes of Exercise per Session: 20 min  Stress: No Stress Concern Present (11/03/2022)   [employer] of Occupational Health - Occupational Stress Questionnaire    Feeling of Stress : Only a little  Social Connections: Moderately Isolated (11/03/2022)   Social Connection and Isolation Panel [NHANES]    Frequency of Communication with Friends and Family: Three times a week    Frequency of Social Gatherings with Friends and Family: Once a week    Attends Religious Services:  Never    Active Member of Clubs or Organizations: No    Attends Banker Meetings: Never    Marital Status: Married     Observations/Objective: Appears well in NAD Breathing normally     BP Readings from Last 3 Encounters:  11/06/22 116/80  06/19/22 112/74  05/15/22 122/78      Assessment and Plan:  See Problem List for Assessment and Plan of chronic medical problems.   Follow Up Instructions:    I discussed the assessment and treatment plan with the patient. The patient was provided an opportunity to ask questions and all were answered. The patient agreed with the plan and demonstrated an understanding of the instructions.   The patient was advised to  call back or seek an in-person evaluation if the symptoms worsen or if the condition fails to improve as anticipated.    Pincus Sanes, MD

## 2022-11-13 ENCOUNTER — Telehealth (INDEPENDENT_AMBULATORY_CARE_PROVIDER_SITE_OTHER): Payer: Medicare PPO | Admitting: Internal Medicine

## 2022-11-13 DIAGNOSIS — Q743 Arthrogryposis multiplex congenita: Secondary | ICD-10-CM

## 2022-11-13 DIAGNOSIS — I10 Essential (primary) hypertension: Secondary | ICD-10-CM | POA: Diagnosis not present

## 2022-11-13 DIAGNOSIS — E782 Mixed hyperlipidemia: Secondary | ICD-10-CM

## 2022-11-13 DIAGNOSIS — R7303 Prediabetes: Secondary | ICD-10-CM | POA: Diagnosis not present

## 2022-11-13 DIAGNOSIS — G459 Transient cerebral ischemic attack, unspecified: Secondary | ICD-10-CM

## 2022-11-13 NOTE — Assessment & Plan Note (Signed)
Chronic Blood pressure well controlled Continue to monitor BP at home Continue carvedilol 6.25 mg twice daily, telmisartan 40 mg daily

## 2022-11-13 NOTE — Assessment & Plan Note (Signed)
Chronic Regular exercise and healthy diet encouraged Lipids have been controlled Continue atorvastatin 20 mg daily

## 2022-11-13 NOTE — Assessment & Plan Note (Signed)
Chronic On disability Stressed regular exercise and staying as active as possible

## 2022-11-19 NOTE — Addendum Note (Signed)
Addended by: Geralyn Flash D on: 11/19/2022 01:49 PM   Modules accepted: Level of Service

## 2022-11-19 NOTE — Progress Notes (Signed)
Remote pacemaker transmission.   

## 2022-12-04 ENCOUNTER — Ambulatory Visit (INDEPENDENT_AMBULATORY_CARE_PROVIDER_SITE_OTHER): Payer: Medicare PPO

## 2022-12-04 DIAGNOSIS — I442 Atrioventricular block, complete: Secondary | ICD-10-CM | POA: Diagnosis not present

## 2022-12-04 LAB — CUP PACEART REMOTE DEVICE CHECK
Battery Remaining Longevity: 2 mo
Battery Voltage: 2.61 V
Brady Statistic AP VP Percent: 31.48 %
Brady Statistic AP VS Percent: 0.01 %
Brady Statistic AS VP Percent: 67.48 %
Brady Statistic AS VS Percent: 1.03 %
Brady Statistic RA Percent Paced: 31.76 %
Brady Statistic RV Percent Paced: 98.96 %
Date Time Interrogation Session: 20240903023053
Implantable Lead Connection Status: 753985
Implantable Lead Connection Status: 753985
Implantable Lead Connection Status: 753985
Implantable Lead Implant Date: 20130401
Implantable Lead Implant Date: 20130401
Implantable Lead Implant Date: 20171124
Implantable Lead Location: 753858
Implantable Lead Location: 753859
Implantable Lead Location: 753860
Implantable Lead Model: 4598
Implantable Lead Model: 5076
Implantable Lead Model: 5076
Implantable Pulse Generator Implant Date: 20171124
Lead Channel Impedance Value: 1045 Ohm
Lead Channel Impedance Value: 1064 Ohm
Lead Channel Impedance Value: 323 Ohm
Lead Channel Impedance Value: 418 Ohm
Lead Channel Impedance Value: 437 Ohm
Lead Channel Impedance Value: 475 Ohm
Lead Channel Impedance Value: 494 Ohm
Lead Channel Impedance Value: 589 Ohm
Lead Channel Impedance Value: 608 Ohm
Lead Channel Impedance Value: 627 Ohm
Lead Channel Impedance Value: 684 Ohm
Lead Channel Impedance Value: 893 Ohm
Lead Channel Impedance Value: 931 Ohm
Lead Channel Impedance Value: 950 Ohm
Lead Channel Pacing Threshold Amplitude: 0.875 V
Lead Channel Pacing Threshold Amplitude: 2 V
Lead Channel Pacing Threshold Pulse Width: 0.4 ms
Lead Channel Pacing Threshold Pulse Width: 1 ms
Lead Channel Sensing Intrinsic Amplitude: 5.125 mV
Lead Channel Sensing Intrinsic Amplitude: 5.125 mV
Lead Channel Sensing Intrinsic Amplitude: 8.875 mV
Lead Channel Sensing Intrinsic Amplitude: 8.875 mV
Lead Channel Setting Pacing Amplitude: 1.75 V
Lead Channel Setting Pacing Amplitude: 2.5 V
Lead Channel Setting Pacing Amplitude: 2.5 V
Lead Channel Setting Pacing Pulse Width: 0.6 ms
Lead Channel Setting Pacing Pulse Width: 1 ms
Lead Channel Setting Sensing Sensitivity: 4 mV
Zone Setting Status: 755011
Zone Setting Status: 755011

## 2022-12-12 NOTE — Progress Notes (Signed)
Remote pacemaker transmission.   

## 2022-12-18 ENCOUNTER — Ambulatory Visit: Payer: Medicare PPO | Admitting: Student

## 2023-01-07 ENCOUNTER — Ambulatory Visit (INDEPENDENT_AMBULATORY_CARE_PROVIDER_SITE_OTHER): Payer: Medicare PPO

## 2023-01-07 DIAGNOSIS — I442 Atrioventricular block, complete: Secondary | ICD-10-CM

## 2023-01-07 LAB — CUP PACEART REMOTE DEVICE CHECK
Battery Remaining Longevity: 1 mo
Battery Voltage: 2.61 V
Brady Statistic AP VP Percent: 37.3 %
Brady Statistic AP VS Percent: 0.01 %
Brady Statistic AS VP Percent: 62.06 %
Brady Statistic AS VS Percent: 0.64 %
Brady Statistic RA Percent Paced: 37.47 %
Brady Statistic RV Percent Paced: 99.35 %
Date Time Interrogation Session: 20241007023221
Implantable Lead Connection Status: 753985
Implantable Lead Connection Status: 753985
Implantable Lead Connection Status: 753985
Implantable Lead Implant Date: 20130401
Implantable Lead Implant Date: 20130401
Implantable Lead Implant Date: 20171124
Implantable Lead Location: 753858
Implantable Lead Location: 753859
Implantable Lead Location: 753860
Implantable Lead Model: 4598
Implantable Lead Model: 5076
Implantable Lead Model: 5076
Implantable Pulse Generator Implant Date: 20171124
Lead Channel Impedance Value: 323 Ohm
Lead Channel Impedance Value: 418 Ohm
Lead Channel Impedance Value: 437 Ohm
Lead Channel Impedance Value: 456 Ohm
Lead Channel Impedance Value: 475 Ohm
Lead Channel Impedance Value: 513 Ohm
Lead Channel Impedance Value: 532 Ohm
Lead Channel Impedance Value: 608 Ohm
Lead Channel Impedance Value: 627 Ohm
Lead Channel Impedance Value: 798 Ohm
Lead Channel Impedance Value: 836 Ohm
Lead Channel Impedance Value: 950 Ohm
Lead Channel Impedance Value: 969 Ohm
Lead Channel Impedance Value: 988 Ohm
Lead Channel Pacing Threshold Amplitude: 0.875 V
Lead Channel Pacing Threshold Amplitude: 1.75 V
Lead Channel Pacing Threshold Pulse Width: 0.4 ms
Lead Channel Pacing Threshold Pulse Width: 1 ms
Lead Channel Sensing Intrinsic Amplitude: 5.125 mV
Lead Channel Sensing Intrinsic Amplitude: 5.125 mV
Lead Channel Sensing Intrinsic Amplitude: 8.875 mV
Lead Channel Sensing Intrinsic Amplitude: 8.875 mV
Lead Channel Setting Pacing Amplitude: 1.75 V
Lead Channel Setting Pacing Amplitude: 2.5 V
Lead Channel Setting Pacing Amplitude: 2.5 V
Lead Channel Setting Pacing Pulse Width: 0.6 ms
Lead Channel Setting Pacing Pulse Width: 1 ms
Lead Channel Setting Sensing Sensitivity: 4 mV
Zone Setting Status: 755011
Zone Setting Status: 755011

## 2023-01-21 NOTE — Progress Notes (Signed)
Remote pacemaker transmission.   

## 2023-01-28 NOTE — Progress Notes (Unsigned)
  Electrophysiology Office Note:   ID:  William Hartman, DOB 09-24-1965, MRN [id number]  Primary Cardiologist: None Electrophysiologist: Sherryl Manges, MD  {Click to update primary MD,subspecialty MD or APP then REFRESH:1}    History of Present Illness:   William Hartman is a 57 y.o. male with h/o CHB s/p PPM, orthostatic hypotension, chronic neuromuscular disorder, and pacemaker induced CM seen today for routine electrophysiology followup.   Last seen in office 06/19/2022. Unable to review.   Since last being seen in our clinic the patient reports doing ***.  he denies chest pain, palpitations, dyspnea, PND, orthopnea, nausea, vomiting, dizziness, syncope, edema, weight gain, or early satiety.   Review of systems complete and found to be negative unless listed in HPI.   EP Information / Studies Reviewed:    EKG is ordered today. Personal review as below.       PPM Interrogation-  reviewed in detail today,  See PACEART report.  Device History: Medtronic BiV PPM implanted 2013, upgraded to BIV 2017 for CHB and pacemaker cardiomyopathy  Physical Exam:   VS:  There were no vitals taken for this visit.   Wt Readings from Last 3 Encounters:  07/23/22 175 lb (79.4 kg)  06/19/22 175 lb (79.4 kg)  01/14/18 175 lb (79.4 kg)     GEN: Well nourished, well developed in no acute distress NECK: No JVD; No carotid bruits CARDIAC: {EPRHYTHM:28826}, no murmurs, rubs, gallops RESPIRATORY:  Clear to auscultation without rales, wheezing or rhonchi  ABDOMEN: Soft, non-tender, non-distended EXTREMITIES:  No edema; No deformity   ASSESSMENT AND PLAN:    CHB s/p Medtronic PPM  Pacemaker mediated cardiomyopathy Normal PPM function See Arita Miss Art report No changes today  Echo 01/2018 showed LVEF improved to 50-55% with BiV pacing  HTN Stable on current regimen   Neuromuscular disease Stable.  {Click here to Review PMH, Prob List, Meds, Allergies, SHx, FHx  :1}   Disposition:   Follow up with  {EPPROVIDERS:28135} {EPFOLLOW UP:28173}  Signed, Graciella Freer, PA-C

## 2023-01-29 ENCOUNTER — Ambulatory Visit: Payer: Medicare PPO | Attending: Student | Admitting: Student

## 2023-01-29 ENCOUNTER — Encounter: Payer: Self-pay | Admitting: *Deleted

## 2023-01-29 ENCOUNTER — Encounter: Payer: Self-pay | Admitting: Student

## 2023-01-29 VITALS — BP 128/90 | HR 77 | Ht 66.0 in

## 2023-01-29 DIAGNOSIS — I442 Atrioventricular block, complete: Secondary | ICD-10-CM

## 2023-01-29 DIAGNOSIS — I420 Dilated cardiomyopathy: Secondary | ICD-10-CM | POA: Diagnosis not present

## 2023-01-29 DIAGNOSIS — I10 Essential (primary) hypertension: Secondary | ICD-10-CM

## 2023-01-29 LAB — CUP PACEART INCLINIC DEVICE CHECK
Battery Remaining Longevity: 1 mo — CL
Battery Voltage: 2.61 V
Brady Statistic AP VP Percent: 28.18 %
Brady Statistic AP VS Percent: 0.01 %
Brady Statistic AS VP Percent: 71.15 %
Brady Statistic AS VS Percent: 0.66 %
Brady Statistic RA Percent Paced: 28.48 %
Brady Statistic RV Percent Paced: 99.32 %
Date Time Interrogation Session: 20241029101547
Implantable Lead Connection Status: 753985
Implantable Lead Connection Status: 753985
Implantable Lead Connection Status: 753985
Implantable Lead Implant Date: 20130401
Implantable Lead Implant Date: 20130401
Implantable Lead Implant Date: 20171124
Implantable Lead Location: 753858
Implantable Lead Location: 753859
Implantable Lead Location: 753860
Implantable Lead Model: 4598
Implantable Lead Model: 5076
Implantable Lead Model: 5076
Implantable Pulse Generator Implant Date: 20171124
Lead Channel Impedance Value: 1026 Ohm
Lead Channel Impedance Value: 1064 Ohm
Lead Channel Impedance Value: 304 Ohm
Lead Channel Impedance Value: 399 Ohm
Lead Channel Impedance Value: 437 Ohm
Lead Channel Impedance Value: 456 Ohm
Lead Channel Impedance Value: 513 Ohm
Lead Channel Impedance Value: 627 Ohm
Lead Channel Impedance Value: 627 Ohm
Lead Channel Impedance Value: 646 Ohm
Lead Channel Impedance Value: 703 Ohm
Lead Channel Impedance Value: 950 Ohm
Lead Channel Impedance Value: 950 Ohm
Lead Channel Impedance Value: 969 Ohm
Lead Channel Pacing Threshold Amplitude: 0.875 V
Lead Channel Pacing Threshold Amplitude: 1.75 V
Lead Channel Pacing Threshold Pulse Width: 0.4 ms
Lead Channel Pacing Threshold Pulse Width: 1 ms
Lead Channel Sensing Intrinsic Amplitude: 5.375 mV
Lead Channel Sensing Intrinsic Amplitude: 5.875 mV
Lead Channel Sensing Intrinsic Amplitude: 8.875 mV
Lead Channel Sensing Intrinsic Amplitude: 8.875 mV
Lead Channel Setting Pacing Amplitude: 1.75 V
Lead Channel Setting Pacing Amplitude: 2.5 V
Lead Channel Setting Pacing Amplitude: 2.5 V
Lead Channel Setting Pacing Pulse Width: 0.6 ms
Lead Channel Setting Pacing Pulse Width: 1 ms
Lead Channel Setting Sensing Sensitivity: 4 mV
Zone Setting Status: 755011
Zone Setting Status: 755011

## 2023-01-29 NOTE — Patient Instructions (Signed)
Medication Instructions:  Your physician recommends that you continue on your current medications as directed. Please refer to the Current Medication list given to you today.  *If you need a refill on your cardiac medications before your next appointment, please call your pharmacy*  Lab Work: BMET, CBC-within 30 days prior to gen change If you have labs (blood work) drawn today and your tests are completely normal, you will receive your results only by: MyChart Message (if you have MyChart) OR A paper copy in the mail If you have any lab test that is abnormal or we need to change your treatment, we will call you to review the results.   Testing/Procedures: See letter   Follow-Up: At [facility], you and your health needs are our priority.  As part of our continuing mission to provide you with exceptional heart care, we have created designated Provider Care Teams.  These Care Teams include your primary Cardiologist (physician) and Advanced Practice Providers (APPs -  Physician Assistants and Nurse Practitioners) who all work together to provide you with the care you need, when you need it.   Your next appointment:   Follow up appointments will be scheduled for you and print out on your discharge summary.

## 2023-02-07 ENCOUNTER — Ambulatory Visit (INDEPENDENT_AMBULATORY_CARE_PROVIDER_SITE_OTHER): Payer: Medicare PPO

## 2023-02-07 DIAGNOSIS — I442 Atrioventricular block, complete: Secondary | ICD-10-CM

## 2023-02-08 LAB — CUP PACEART REMOTE DEVICE CHECK
Battery Remaining Longevity: 1 mo — CL
Battery Voltage: 2.6 V
Brady Statistic AP VP Percent: 32.99 %
Brady Statistic AP VS Percent: 0.01 %
Brady Statistic AS VP Percent: 66.29 %
Brady Statistic AS VS Percent: 0.71 %
Brady Statistic RA Percent Paced: 33.58 %
Brady Statistic RV Percent Paced: 99.28 %
Date Time Interrogation Session: 20241108013113
Implantable Lead Connection Status: 753985
Implantable Lead Connection Status: 753985
Implantable Lead Connection Status: 753985
Implantable Lead Implant Date: 20130401
Implantable Lead Implant Date: 20130401
Implantable Lead Implant Date: 20171124
Implantable Lead Location: 753858
Implantable Lead Location: 753859
Implantable Lead Location: 753860
Implantable Lead Model: 4598
Implantable Lead Model: 5076
Implantable Lead Model: 5076
Implantable Pulse Generator Implant Date: 20171124
Lead Channel Impedance Value: 1045 Ohm
Lead Channel Impedance Value: 1064 Ohm
Lead Channel Impedance Value: 323 Ohm
Lead Channel Impedance Value: 418 Ohm
Lead Channel Impedance Value: 456 Ohm
Lead Channel Impedance Value: 475 Ohm
Lead Channel Impedance Value: 494 Ohm
Lead Channel Impedance Value: 608 Ohm
Lead Channel Impedance Value: 627 Ohm
Lead Channel Impedance Value: 646 Ohm
Lead Channel Impedance Value: 684 Ohm
Lead Channel Impedance Value: 912 Ohm
Lead Channel Impedance Value: 950 Ohm
Lead Channel Impedance Value: 950 Ohm
Lead Channel Pacing Threshold Amplitude: 0.875 V
Lead Channel Pacing Threshold Amplitude: 1.75 V
Lead Channel Pacing Threshold Pulse Width: 0.4 ms
Lead Channel Pacing Threshold Pulse Width: 1 ms
Lead Channel Sensing Intrinsic Amplitude: 5 mV
Lead Channel Sensing Intrinsic Amplitude: 5 mV
Lead Channel Sensing Intrinsic Amplitude: 8.875 mV
Lead Channel Sensing Intrinsic Amplitude: 8.875 mV
Lead Channel Setting Pacing Amplitude: 1.75 V
Lead Channel Setting Pacing Amplitude: 2.5 V
Lead Channel Setting Pacing Amplitude: 2.5 V
Lead Channel Setting Pacing Pulse Width: 0.6 ms
Lead Channel Setting Pacing Pulse Width: 1 ms
Lead Channel Setting Sensing Sensitivity: 4 mV
Zone Setting Status: 755011
Zone Setting Status: 755011

## 2023-02-19 ENCOUNTER — Other Ambulatory Visit: Payer: Self-pay | Admitting: Student

## 2023-02-19 ENCOUNTER — Other Ambulatory Visit: Payer: Self-pay

## 2023-02-19 DIAGNOSIS — I442 Atrioventricular block, complete: Secondary | ICD-10-CM | POA: Diagnosis not present

## 2023-02-19 DIAGNOSIS — I420 Dilated cardiomyopathy: Secondary | ICD-10-CM | POA: Diagnosis not present

## 2023-02-19 DIAGNOSIS — I10 Essential (primary) hypertension: Secondary | ICD-10-CM | POA: Diagnosis not present

## 2023-02-19 NOTE — Addendum Note (Signed)
Addended by: Elease Etienne A on: 02/19/2023 04:04 PM   Modules accepted: Level of Service

## 2023-02-19 NOTE — Progress Notes (Signed)
Remote pacemaker transmission.   

## 2023-02-20 LAB — BASIC METABOLIC PANEL
BUN/Creatinine Ratio: 30 — ABNORMAL HIGH (ref 9–20)
BUN: 17 mg/dL (ref 6–24)
CO2: 18 mmol/L — ABNORMAL LOW (ref 20–29)
Calcium: 10 mg/dL (ref 8.7–10.2)
Chloride: 105 mmol/L (ref 96–106)
Creatinine, Ser: 0.56 mg/dL — ABNORMAL LOW (ref 0.76–1.27)
Glucose: 90 mg/dL (ref 70–99)
Potassium: 4.3 mmol/L (ref 3.5–5.2)
Sodium: 140 mmol/L (ref 134–144)
eGFR: 116 mL/min/{1.73_m2} (ref >59)

## 2023-02-20 LAB — CBC
Hematocrit: 46.5 % (ref 37.5–51.0)
Hemoglobin: 15.3 g/dL (ref 13.0–17.7)
MCH: 31.4 pg (ref 26.6–33.0)
MCHC: 32.9 g/dL (ref 31.5–35.7)
MCV: 96 fL (ref 79–97)
Platelets: 351 10*3/uL (ref 150–450)
RBC: 4.87 x10E6/uL (ref 4.14–5.80)
RDW: 12.8 % (ref 11.6–15.4)
WBC: 17.5 10*3/uL — ABNORMAL HIGH (ref 3.4–10.8)

## 2023-03-01 NOTE — Pre-Procedure Instructions (Signed)
Attempted to call patient regarding procedure instructions.  Left voicemail on the following items: Arrival time 0900 Nothing to eat or drink after midnight No meds AM of procedure Responsible person to drive you home and stay with you for 24 hrs Wash with special soap night before and morning of procedure

## 2023-03-04 ENCOUNTER — Ambulatory Visit (HOSPITAL_COMMUNITY)
Admission: RE | Admit: 2023-03-04 | Discharge: 2023-03-04 | Disposition: A | Discharge status: Home / Self Care | Payer: Medicare PPO | Attending: Internal Medicine | Admitting: Internal Medicine

## 2023-03-04 ENCOUNTER — Inpatient Hospital Stay (HOSPITAL_COMMUNITY)
Admit: 2023-03-04 | Discharge: 2023-03-04 | Disposition: A | Discharge status: Home / Self Care | Payer: Medicare PPO | Attending: Cardiology | Admitting: Cardiology

## 2023-03-04 ENCOUNTER — Other Ambulatory Visit: Payer: Self-pay

## 2023-03-04 ENCOUNTER — Ambulatory Visit (HOSPITAL_COMMUNITY)
Admission: RE | Disposition: A | Discharge status: Home / Self Care | Payer: Self-pay | Source: Home / Self Care | Attending: Internal Medicine

## 2023-03-04 DIAGNOSIS — I4719 Other supraventricular tachycardia: Secondary | ICD-10-CM | POA: Diagnosis not present

## 2023-03-04 DIAGNOSIS — R030 Elevated blood-pressure reading, without diagnosis of hypertension: Secondary | ICD-10-CM | POA: Insufficient documentation

## 2023-03-04 DIAGNOSIS — Z45010 Encounter for checking and testing of cardiac pacemaker pulse generator [battery]: Secondary | ICD-10-CM | POA: Insufficient documentation

## 2023-03-04 DIAGNOSIS — I4892 Unspecified atrial flutter: Secondary | ICD-10-CM | POA: Diagnosis not present

## 2023-03-04 DIAGNOSIS — R42 Dizziness and giddiness: Secondary | ICD-10-CM | POA: Diagnosis not present

## 2023-03-04 DIAGNOSIS — I442 Atrioventricular block, complete: Secondary | ICD-10-CM | POA: Diagnosis not present

## 2023-03-04 DIAGNOSIS — Q743 Arthrogryposis multiplex congenita: Secondary | ICD-10-CM | POA: Insufficient documentation

## 2023-03-04 HISTORY — PX: BIV PACEMAKER GENERATOR CHANGEOUT: EP1198

## 2023-03-04 SURGERY — BIV PACEMAKER GENERATOR CHANGEOUT

## 2023-03-04 MED ORDER — POVIDONE-IODINE 10 % EX SWAB
2.0000 | Freq: Once | CUTANEOUS | Status: AC
Start: 2023-03-04 — End: 2023-03-04
  Administered 2023-03-04: 2 via TOPICAL

## 2023-03-04 MED ORDER — SODIUM CHLORIDE 0.9 % IV SOLN: 80.0000 mg | INTRAVENOUS | Status: AC

## 2023-03-04 MED ORDER — SODIUM CHLORIDE 0.9 % IV SOLN: INTRAVENOUS | Status: DC

## 2023-03-04 MED ORDER — CEFAZOLIN SODIUM-DEXTROSE 2-4 GM/100ML-% IV SOLN
INTRAVENOUS | Status: AC
  Administered 2023-03-04: 2 g via INTRAVENOUS
  Filled 2023-03-04: qty 100

## 2023-03-04 MED ORDER — MIDAZOLAM HCL 5 MG/5ML IJ SOLN
INTRAMUSCULAR | Status: DC | PRN
  Administered 2023-03-04: 1 mg via INTRAVENOUS

## 2023-03-04 MED ORDER — LIDOCAINE HCL (PF) 1 % IJ SOLN
INTRAMUSCULAR | Status: AC
  Filled 2023-03-04: qty 60

## 2023-03-04 MED ORDER — FENTANYL CITRATE (PF) 100 MCG/2ML IJ SOLN
INTRAMUSCULAR | Status: AC
  Filled 2023-03-04: qty 2

## 2023-03-04 MED ORDER — MIDAZOLAM HCL 2 MG/2ML IJ SOLN
INTRAMUSCULAR | Status: AC
  Filled 2023-03-04: qty 2

## 2023-03-04 MED ORDER — CEFAZOLIN SODIUM-DEXTROSE 2-4 GM/100ML-% IV SOLN: 2.0000 g | INTRAVENOUS | Status: AC

## 2023-03-04 MED ORDER — ACETAMINOPHEN 325 MG PO TABS: 325.0000 mg | ORAL_TABLET | ORAL | Status: DC | PRN

## 2023-03-04 MED ORDER — SODIUM CHLORIDE 0.9 % IV SOLN
INTRAVENOUS | Status: AC
  Administered 2023-03-04: 80 mg
  Filled 2023-03-04: qty 2

## 2023-03-04 MED ORDER — FENTANYL CITRATE (PF) 100 MCG/2ML IJ SOLN
INTRAMUSCULAR | Status: DC | PRN
  Administered 2023-03-04 (×2): 25 ug via INTRAVENOUS

## 2023-03-04 MED ORDER — LIDOCAINE HCL (PF) 1 % IJ SOLN
INTRAMUSCULAR | Status: DC | PRN
  Administered 2023-03-04: 50 mL

## 2023-03-04 MED ORDER — CHLORHEXIDINE GLUCONATE 4 % EX SOLN: 4.0000 | Freq: Once | CUTANEOUS | Status: DC

## 2023-03-04 SURGICAL SUPPLY — 10 items
CABLE SURGICAL S-101-97-12 (CABLE) ×1 IMPLANT
DEVICE CRTP PERCEPTA QUAD MRI (Pacemaker) IMPLANT
DEVICE DISSECT PLASMABLAD 3.0S (MISCELLANEOUS) IMPLANT
KIT LEAD ACCESSORY 6056M PINCH (KITS) IMPLANT
KIT WRENCH (KITS) IMPLANT
PAD DEFIB RADIO PHYSIO CONN (PAD) IMPLANT
PLASMABLADE 3.0S (MISCELLANEOUS) ×1
POUCH AIGIS-R ANTIBACT PPM (Mesh General) ×1 IMPLANT
POUCH AIGIS-R ANTIBACT PPM MED (Mesh General) IMPLANT
TRAY PACEMAKER INSERTION (PACKS) ×1 IMPLANT

## 2023-03-04 NOTE — Discharge Instructions (Signed)
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
## 2023-03-05 ENCOUNTER — Encounter (HOSPITAL_COMMUNITY): Payer: Self-pay | Admitting: Internal Medicine

## 2023-03-05 ENCOUNTER — Telehealth: Payer: Self-pay

## 2023-03-05 DIAGNOSIS — R42 Dizziness and giddiness: Secondary | ICD-10-CM | POA: Diagnosis not present

## 2023-03-05 NOTE — Telephone Encounter (Signed)
Patient was called about getting a transmission but no answer. Left message on voicemail. Pt does/doesnt have questions about wound care.

## 2023-03-06 ENCOUNTER — Encounter: Payer: Self-pay | Admitting: Internal Medicine

## 2023-03-07 MED FILL — Midazolam HCl Inj 2 MG/2ML (Base Equivalent): INTRAMUSCULAR | Qty: 1 | Status: AC

## 2023-03-07 NOTE — Telephone Encounter (Signed)
Pt is getting a new handheld from [employer].

## 2023-03-11 ENCOUNTER — Encounter: Payer: Self-pay | Admitting: Internal Medicine

## 2023-03-12 MED ORDER — CEPHALEXIN 500 MG PO CAPS: 500.0000 mg | ORAL_CAPSULE | Freq: Three times a day (TID) | ORAL | 0 refills | Status: DC

## 2023-03-13 NOTE — Patient Instructions (Signed)
   After Your Pacemaker   Monitor your pacemaker site for redness, swelling, and drainage. Call the device clinic at [phone] if you experience these symptoms or fever/chills.  Your incision was closed with Dermabond:  You may shower 1 day after your defibrillator implant and wash your incision with soap and water. Avoid lotions, ointments, or perfumes over your incision until it is well-healed.  You may use a hot tub or a pool after your wound check appointment if the incision is completely closed.  There are no other restrictions in arm movement after your wound check appointment.  You may drive, unless driving has been restricted by your healthcare providers.  Remote monitoring is used to monitor your pacemaker from home. This monitoring is scheduled every 91 days by our office. It allows us to keep an eye on the functioning of your device to ensure it is working properly. You will routinely see your Electrophysiologist annually (more often if necessary).  

## 2023-03-14 ENCOUNTER — Ambulatory Visit: Payer: Medicare PPO | Attending: Cardiology

## 2023-03-14 DIAGNOSIS — I442 Atrioventricular block, complete: Secondary | ICD-10-CM | POA: Diagnosis not present

## 2023-03-14 LAB — CUP PACEART INCLINIC DEVICE CHECK
Battery Remaining Longevity: 116 mo
Battery Voltage: 3.19 V
Brady Statistic AP VP Percent: 35.43 %
Brady Statistic AP VS Percent: 0 %
Brady Statistic AS VP Percent: 64.56 %
Brady Statistic AS VS Percent: 0 %
Brady Statistic RA Percent Paced: 35.39 %
Brady Statistic RV Percent Paced: 100 %
Date Time Interrogation Session: 20241212221658
Implantable Lead Connection Status: 753985
Implantable Lead Connection Status: 753985
Implantable Lead Connection Status: 753985
Implantable Lead Implant Date: 20130401
Implantable Lead Implant Date: 20130401
Implantable Lead Implant Date: 20171124
Implantable Lead Location: 753858
Implantable Lead Location: 753859
Implantable Lead Location: 753860
Implantable Lead Model: 4598
Implantable Lead Model: 5076
Implantable Lead Model: 5076
Implantable Pulse Generator Implant Date: 20241202
Lead Channel Impedance Value: 1007 Ohm
Lead Channel Impedance Value: 285 Ohm
Lead Channel Impedance Value: 361 Ohm
Lead Channel Impedance Value: 399 Ohm
Lead Channel Impedance Value: 418 Ohm
Lead Channel Impedance Value: 456 Ohm
Lead Channel Impedance Value: 494 Ohm
Lead Channel Impedance Value: 513 Ohm
Lead Channel Impedance Value: 608 Ohm
Lead Channel Impedance Value: 608 Ohm
Lead Channel Impedance Value: 779 Ohm
Lead Channel Impedance Value: 798 Ohm
Lead Channel Impedance Value: 836 Ohm
Lead Channel Impedance Value: 912 Ohm
Lead Channel Pacing Threshold Amplitude: 0.75 V
Lead Channel Pacing Threshold Amplitude: 0.875 V
Lead Channel Pacing Threshold Pulse Width: 0.4 ms
Lead Channel Pacing Threshold Pulse Width: 0.4 ms
Lead Channel Sensing Intrinsic Amplitude: 5 mV
Lead Channel Sensing Intrinsic Amplitude: 5.375 mV
Lead Channel Setting Pacing Amplitude: 1.75 V
Lead Channel Setting Pacing Amplitude: 2 V
Lead Channel Setting Pacing Amplitude: 3.5 V
Lead Channel Setting Pacing Pulse Width: 0.4 ms
Lead Channel Setting Pacing Pulse Width: 0.4 ms
Lead Channel Setting Sensing Sensitivity: 4 mV
Zone Setting Status: 755011
Zone Setting Status: 755011

## 2023-03-14 NOTE — Progress Notes (Signed)
Wound check appointment. Steri-strips removed. Wound without redness or edema. Incision edges approximated, wound well healed.  There is known increase in swelling to right arm (device place to right chest).  He is on 2nd day of Keflex with noted improvement to redness/warmth.  A. Tillery PA-C evaluated arm and determined normal healing, to continue abx.   Normal device function. Thresholds, sensing, and impedances consistent with implant measurements. Device programmed at auto capture programmed on for extra safety margin until 3 month visit. Histogram distribution appropriate for patient and level of activity. No mode switches or high ventricular rates noted. Patient educated about wound care, arm mobility, lifting restrictions. ROV in 3 months with implanting physician.

## 2023-03-20 NOTE — Telephone Encounter (Signed)
Transmission received 03/12/2023

## 2023-05-01 NOTE — Telephone Encounter (Signed)
Called to check on pts arm\ No answer  LEFT VM

## 2023-05-02 ENCOUNTER — Telehealth: Payer: Self-pay

## 2023-05-02 NOTE — Telephone Encounter (Signed)
Patient returned call

## 2023-05-02 NOTE — Telephone Encounter (Signed)
Attempted to call patient per Dr. Graciela Husbands request to check on arm post abx. No answer, LMTCB.

## 2023-05-02 NOTE — Telephone Encounter (Signed)
Patient reports right arm is improving slowly. States some swelling is still present and small "tough" areas. States he is not sure if they were there before but he doesn't think so.   Patient states he talked to Dr. Graciela Husbands and is going to see him in March.   Pt advised if any questions or concerns let us know.

## 2023-05-28 ENCOUNTER — Encounter: Payer: Self-pay | Admitting: Internal Medicine

## 2023-06-02 MED ORDER — CELECOXIB 200 MG PO CAPS: 200.0000 mg | ORAL_CAPSULE | Freq: Two times a day (BID) | ORAL | 5 refills | Status: AC | PRN

## 2023-06-02 NOTE — Addendum Note (Signed)
 Addended by: Pincus Sanes on: 06/02/2023 04:10 PM   Modules accepted: Orders

## 2023-06-04 ENCOUNTER — Encounter: Payer: Self-pay | Admitting: Internal Medicine

## 2023-06-04 ENCOUNTER — Ambulatory Visit: Payer: Medicare PPO | Attending: Internal Medicine | Admitting: Internal Medicine

## 2023-06-04 ENCOUNTER — Ambulatory Visit (INDEPENDENT_AMBULATORY_CARE_PROVIDER_SITE_OTHER): Payer: Medicare PPO

## 2023-06-04 VITALS — BP 116/68 | HR 88 | Ht 67.0 in | Wt 175.0 lb

## 2023-06-04 DIAGNOSIS — I420 Dilated cardiomyopathy: Secondary | ICD-10-CM | POA: Diagnosis not present

## 2023-06-04 DIAGNOSIS — I442 Atrioventricular block, complete: Secondary | ICD-10-CM

## 2023-06-04 DIAGNOSIS — Z950 Presence of cardiac pacemaker: Secondary | ICD-10-CM | POA: Diagnosis not present
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
## 2023-06-04 NOTE — Patient Instructions (Signed)
Medication Instructions:  Your physician recommends that you continue on your current medications as directed. Please refer to the Current Medication list given to you today.  *If you need a refill on your cardiac medications before your next appointment, please call your pharmacy*   Lab Work: None ordered.  If you have labs (blood work) drawn today and your tests are completely normal, you will receive your results only by: [place] (if you have MyChart) OR A paper copy in the mail If you have any lab test that is abnormal or we need to change your treatment, we will call you to review the results.   Testing/Procedures: None ordered.    Follow-Up: At [facility], you and your health needs are our priority.  As part of our continuing mission to provide you with exceptional heart care, we have created designated Provider Care Teams.  These Care Teams include your primary Cardiologist (physician) and Advanced Practice Providers (APPs -  Physician Assistants and Nurse Practitioners) who all work together to provide you with the care you need, when you need it.  We recommend signing up for the patient portal called "MyChart".  Sign up information is provided on this After Visit Summary.  MyChart is used to connect with patients for Virtual Visits (Telemedicine).  Patients are able to view lab/test results, encounter notes, upcoming appointments, etc.  Non-urgent messages can be sent to your provider as well.   To learn more about what you can do with MyChart, go to [email or web address].    Your next appointment:   9 months with Dr Olin Pia PA

## 2023-06-06 LAB — CUP PACEART REMOTE DEVICE CHECK
Battery Remaining Longevity: 111 mo
Battery Voltage: 3.14 V
Brady Statistic AP VP Percent: 34.48 %
Brady Statistic AP VS Percent: 0 %
Brady Statistic AS VP Percent: 65.46 %
Brady Statistic AS VS Percent: 0.07 %
Brady Statistic RA Percent Paced: 34.41 %
Brady Statistic RV Percent Paced: 99.93 %
Date Time Interrogation Session: 20250304013652
Implantable Lead Connection Status: 753985
Implantable Lead Connection Status: 753985
Implantable Lead Connection Status: 753985
Implantable Lead Implant Date: 20130401
Implantable Lead Implant Date: 20130401
Implantable Lead Implant Date: 20171124
Implantable Lead Location: 753858
Implantable Lead Location: 753859
Implantable Lead Location: 753860
Implantable Lead Model: 4598
Implantable Lead Model: 5076
Implantable Lead Model: 5076
Implantable Pulse Generator Implant Date: 20241202
Lead Channel Impedance Value: 285 Ohm
Lead Channel Impedance Value: 380 Ohm
Lead Channel Impedance Value: 399 Ohm
Lead Channel Impedance Value: 418 Ohm
Lead Channel Impedance Value: 437 Ohm
Lead Channel Impedance Value: 475 Ohm
Lead Channel Impedance Value: 513 Ohm
Lead Channel Impedance Value: 532 Ohm
Lead Channel Impedance Value: 589 Ohm
Lead Channel Impedance Value: 779 Ohm
Lead Channel Impedance Value: 798 Ohm
Lead Channel Impedance Value: 836 Ohm
Lead Channel Impedance Value: 874 Ohm
Lead Channel Impedance Value: 912 Ohm
Lead Channel Pacing Threshold Amplitude: 0.75 V
Lead Channel Pacing Threshold Amplitude: 0.875 V
Lead Channel Pacing Threshold Pulse Width: 0.4 ms
Lead Channel Pacing Threshold Pulse Width: 0.4 ms
Lead Channel Sensing Intrinsic Amplitude: 4.375 mV
Lead Channel Sensing Intrinsic Amplitude: 4.375 mV
Lead Channel Setting Pacing Amplitude: 1.75 V
Lead Channel Setting Pacing Amplitude: 2 V
Lead Channel Setting Pacing Amplitude: 3.5 V
Lead Channel Setting Pacing Pulse Width: 0.4 ms
Lead Channel Setting Pacing Pulse Width: 0.4 ms
Lead Channel Setting Sensing Sensitivity: 4 mV
Zone Setting Status: 755011
Zone Setting Status: 755011

## 2023-06-14 LAB — CUP PACEART INCLINIC DEVICE CHECK
Date Time Interrogation Session: 20250304091724
Implantable Lead Connection Status: 753985
Implantable Lead Connection Status: 753985
Implantable Lead Connection Status: 753985
Implantable Lead Implant Date: 20130401
Implantable Lead Implant Date: 20130401
Implantable Lead Implant Date: 20171124
Implantable Lead Location: 753858
Implantable Lead Location: 753859
Implantable Lead Location: 753860
Implantable Lead Model: 4598
Implantable Lead Model: 5076
Implantable Lead Model: 5076
Implantable Pulse Generator Implant Date: 20241202

## 2023-07-09 NOTE — Addendum Note (Signed)
 Addended by: Geralyn Flash D on: 07/09/2023 01:19 PM   Modules accepted: Orders

## 2023-07-09 NOTE — Progress Notes (Signed)
 Remote pacemaker transmission.

## 2023-07-15 ENCOUNTER — Encounter: Payer: Self-pay | Admitting: Internal Medicine

## 2023-07-24 ENCOUNTER — Ambulatory Visit: Payer: Medicare PPO

## 2023-07-30 ENCOUNTER — Other Ambulatory Visit: Payer: Self-pay | Admitting: Internal Medicine

## 2023-09-03 ENCOUNTER — Ambulatory Visit (INDEPENDENT_AMBULATORY_CARE_PROVIDER_SITE_OTHER): Payer: Medicare PPO

## 2023-09-03 DIAGNOSIS — I442 Atrioventricular block, complete: Secondary | ICD-10-CM

## 2023-09-03 LAB — CUP PACEART REMOTE DEVICE CHECK
Battery Remaining Longevity: 109 mo
Battery Voltage: 3.07 V
Brady Statistic AP VP Percent: 33.28 %
Brady Statistic AP VS Percent: 0 %
Brady Statistic AS VP Percent: 66.66 %
Brady Statistic AS VS Percent: 0.06 %
Brady Statistic RA Percent Paced: 33.21 %
Brady Statistic RV Percent Paced: 99.94 %
Date Time Interrogation Session: 20250603023553
Implantable Lead Connection Status: 753985
Implantable Lead Connection Status: 753985
Implantable Lead Connection Status: 753985
Implantable Lead Implant Date: 20130401
Implantable Lead Implant Date: 20130401
Implantable Lead Implant Date: 20171124
Implantable Lead Location: 753858
Implantable Lead Location: 753859
Implantable Lead Location: 753860
Implantable Lead Model: 4598
Implantable Lead Model: 5076
Implantable Lead Model: 5076
Implantable Pulse Generator Implant Date: 20241202
Lead Channel Impedance Value: 304 Ohm
Lead Channel Impedance Value: 399 Ohm
Lead Channel Impedance Value: 399 Ohm
Lead Channel Impedance Value: 437 Ohm
Lead Channel Impedance Value: 456 Ohm
Lead Channel Impedance Value: 513 Ohm
Lead Channel Impedance Value: 532 Ohm
Lead Channel Impedance Value: 551 Ohm
Lead Channel Impedance Value: 608 Ohm
Lead Channel Impedance Value: 798 Ohm
Lead Channel Impedance Value: 836 Ohm
Lead Channel Impedance Value: 855 Ohm
Lead Channel Impedance Value: 912 Ohm
Lead Channel Impedance Value: 931 Ohm
Lead Channel Pacing Threshold Amplitude: 0.75 V
Lead Channel Pacing Threshold Amplitude: 0.875 V
Lead Channel Pacing Threshold Pulse Width: 0.4 ms
Lead Channel Pacing Threshold Pulse Width: 0.4 ms
Lead Channel Sensing Intrinsic Amplitude: 4.875 mV
Lead Channel Sensing Intrinsic Amplitude: 4.875 mV
Lead Channel Setting Pacing Amplitude: 1.75 V
Lead Channel Setting Pacing Amplitude: 2 V
Lead Channel Setting Pacing Amplitude: 3.5 V
Lead Channel Setting Pacing Pulse Width: 0.4 ms
Lead Channel Setting Pacing Pulse Width: 0.4 ms
Lead Channel Setting Sensing Sensitivity: 4 mV
Zone Setting Status: 755011
Zone Setting Status: 755011

## 2023-09-09 ENCOUNTER — Encounter: Payer: Self-pay | Admitting: Internal Medicine

## 2023-09-13 ENCOUNTER — Ambulatory Visit: Payer: Self-pay | Admitting: Cardiology

## 2023-09-13 NOTE — Progress Notes (Signed)
 Device Clinic apt made 09/17/23 @ 9:20 am.

## 2023-09-17 ENCOUNTER — Ambulatory Visit: Attending: Cardiology

## 2023-09-17 DIAGNOSIS — I442 Atrioventricular block, complete: Secondary | ICD-10-CM

## 2023-09-17 NOTE — Progress Notes (Signed)
 Normal in-clinic CRT-D (multi-lead) check. Presenting Rhythm: AS/BVP 70. Routine testing was performed. Thresholds, sensing, impedance trend were stable. HF diagnostics are stable. No treated arrhythmias. Patient BiV pacing 99 % of the time. Estimated longevity 7.3 years. Pt enrolled in remote follow-up.  Measured LV output today was 3.75 V @ 0.4 ms.  LV output 2.0 V @ 1.0 ms.   Changes made: LV pulse width programmed from 0.4 ms to 1.0 ms.

## 2023-09-25 ENCOUNTER — Ambulatory Visit

## 2023-09-25 VITALS — Ht 67.0 in | Wt 175.0 lb

## 2023-09-25 DIAGNOSIS — Z0000 Encounter for general adult medical examination without abnormal findings: Secondary | ICD-10-CM | POA: Diagnosis not present
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
## 2023-09-25 NOTE — Patient Instructions (Signed)
 [name] , Thank you for taking time out of your busy schedule to complete your Annual Wellness Visit with me. I enjoyed our conversation and look forward to speaking with you again next year. I, as well as your care team,  appreciate your ongoing commitment to your health goals. Please review the following plan we discussed and let me know if I can assist you in the future. Your Game plan/ To Do List   Follow up Visits: Next Medicare AWV with our clinical staff: 09/25/2024.   Have you seen your provider in the last 6 months (3 months if uncontrolled diabetes)? No Next Office Visit with your provider: Patient stated that he will call office closer to the Fall time, to schedule an office visit.  His last office visit was 11/06/2022.  Clinician Recommendations:  Aim for 30 minutes of exercise or brisk walking, 6-8 glasses of water, and 5 servings of fruits and vegetables each day. You are due for a Shingles vaccine and pneumonia vaccine.  You can get these done at your local pharmacy.  You will be due for a Tetanus vaccine in November of this year.  Please discuss the Hepatitis B vaccines with Dr. Geofm, for recommendations.   Remember to call the office soon to get scheduled for your yearly visit with PCP.         This is a list of the screening recommended for you and due dates:  Health Maintenance  Topic Date Due   Hepatitis B Vaccine (1 of 3 - 19+ 3-dose series) Never done   Zoster (Shingles) Vaccine (1 of 2) Never done   COVID-19 Vaccine (3 - 2024-25 season) 12/02/2022   Medicare Annual Wellness Visit  07/23/2023   DTaP/Tdap/Td vaccine (3 - Td or Tdap) 02/24/2024   Cologuard (Stool DNA test)  01/08/2025   HIV Screening  Completed   Pneumococcal Vaccination  Aged Out   HPV Vaccine  Aged Out   Meningitis B Vaccine  Aged Out   Flu Shot  Discontinued   Hepatitis C Screening  Discontinued    Advanced directives: (Declined) Advance directive discussed with you today. Even though you declined  this today, please call our office should you change your mind, and we can give you the proper paperwork for you to fill out. Advance Care Planning is important because it:  [x]  Makes sure you receive the medical care that is consistent with your values, goals, and preferences  [x]  It provides guidance to your family and loved ones and reduces their decisional burden about whether or not they are making the right decisions based on your wishes.  Follow the link provided in your after visit summary or read over the paperwork we have mailed to you to help you started getting your Advance Directives in place. If you need assistance in completing these, please reach out to us  so that we can help you!  See attachments for Preventive Care and Fall Prevention Tips.

## 2023-10-29 NOTE — Progress Notes (Signed)
 Remote pacemaker transmission.

## 2023-11-10 ENCOUNTER — Other Ambulatory Visit: Payer: Self-pay | Admitting: Internal Medicine

## 2023-12-03 ENCOUNTER — Ambulatory Visit (INDEPENDENT_AMBULATORY_CARE_PROVIDER_SITE_OTHER): Payer: Medicare PPO

## 2023-12-03 DIAGNOSIS — I420 Dilated cardiomyopathy: Secondary | ICD-10-CM

## 2023-12-05 LAB — CUP PACEART REMOTE DEVICE CHECK
Battery Remaining Longevity: 82 mo
Battery Voltage: 3.02 V
Brady Statistic AP VP Percent: 30.48 %
Brady Statistic AP VS Percent: 0 %
Brady Statistic AS VP Percent: 69.48 %
Brady Statistic AS VS Percent: 0.04 %
Brady Statistic RA Percent Paced: 30.44 %
Brady Statistic RV Percent Paced: 99.96 %
Date Time Interrogation Session: 20250902023750
Implantable Lead Connection Status: 753985
Implantable Lead Connection Status: 753985
Implantable Lead Connection Status: 753985
Implantable Lead Implant Date: 20130401
Implantable Lead Implant Date: 20130401
Implantable Lead Implant Date: 20171124
Implantable Lead Location: 753858
Implantable Lead Location: 753859
Implantable Lead Location: 753860
Implantable Lead Model: 4598
Implantable Lead Model: 5076
Implantable Lead Model: 5076
Implantable Pulse Generator Implant Date: 20241202
Lead Channel Impedance Value: 304 Ohm
Lead Channel Impedance Value: 399 Ohm
Lead Channel Impedance Value: 399 Ohm
Lead Channel Impedance Value: 437 Ohm
Lead Channel Impedance Value: 437 Ohm
Lead Channel Impedance Value: 513 Ohm
Lead Channel Impedance Value: 532 Ohm
Lead Channel Impedance Value: 551 Ohm
Lead Channel Impedance Value: 589 Ohm
Lead Channel Impedance Value: 798 Ohm
Lead Channel Impedance Value: 836 Ohm
Lead Channel Impedance Value: 893 Ohm
Lead Channel Impedance Value: 931 Ohm
Lead Channel Impedance Value: 931 Ohm
Lead Channel Pacing Threshold Amplitude: 0.625 V
Lead Channel Pacing Threshold Amplitude: 1 V
Lead Channel Pacing Threshold Pulse Width: 0.4 ms
Lead Channel Pacing Threshold Pulse Width: 0.4 ms
Lead Channel Sensing Intrinsic Amplitude: 4.75 mV
Lead Channel Sensing Intrinsic Amplitude: 4.75 mV
Lead Channel Setting Pacing Amplitude: 2 V
Lead Channel Setting Pacing Amplitude: 2 V
Lead Channel Setting Pacing Amplitude: 3.5 V
Lead Channel Setting Pacing Pulse Width: 0.4 ms
Lead Channel Setting Pacing Pulse Width: 1 ms
Lead Channel Setting Sensing Sensitivity: 4 mV
Zone Setting Status: 755011
Zone Setting Status: 755011

## 2023-12-06 ENCOUNTER — Ambulatory Visit: Payer: Self-pay | Admitting: Cardiology

## 2023-12-10 NOTE — Progress Notes (Signed)
 Remote PPM Transmission

## 2023-12-15 ENCOUNTER — Encounter: Payer: Self-pay | Admitting: Internal Medicine

## 2023-12-15 NOTE — Progress Notes (Signed)
 Subjective:    Patient ID: William Hartman, male    DOB: July 12, 1965, 58 y.o.   MRN: [id number]     HPI [name] is here for a physical exam and his chronic medical problems.   Mildly dizziness all the time - lack of sleep or poor quality sleep makes it worse - sleep 7-8 hrs.  Bad is 6 hrs. Feels off balnce - like on a boat.  Only fels it was standing and moving. Not daily.    No longer driving - wants to get license back    Medications and allergies reviewed with patient and updated if appropriate.  Current Outpatient Medications on File Prior to Visit  Medication Sig Dispense Refill   acetaminophen  (TYLENOL ) 325 MG tablet Take 650 mg by mouth every 6 (six) hours as needed for mild pain or headache.     atorvastatin  (LIPITOR) 20 MG tablet TAKE ONE TABLET BY MOUTH DAILY 90 tablet 0   carvedilol  (COREG ) 6.25 MG tablet TAKE ONE TABLET BY MOUTH TWICE DAILY WITH A MEAL 180 tablet 0   celecoxib  (CELEBREX ) 200 MG capsule Take 1 capsule (200 mg total) by mouth 2 (two) times daily as needed for mild pain (pain score 1-3) (for pain). 60 capsule 5   Melatonin 3 MG CAPS Take 1 capsule by mouth at bedtime as needed (for sleep).     telmisartan  (MICARDIS ) 40 MG tablet TAKE ONE TABLET BY MOUTH DAILY 90 tablet 0   No current facility-administered medications on file prior to visit.    Review of Systems  Constitutional:  Negative for fever.  Eyes:  Negative for visual disturbance.  Respiratory:  Negative for cough, shortness of breath and wheezing.   Cardiovascular:  Positive for leg swelling. Negative for chest pain and palpitations.  Gastrointestinal:  Negative for abdominal pain, blood in stool, constipation and diarrhea.       No gerd  Genitourinary:  Negative for difficulty urinating and dysuria.  Musculoskeletal:  Positive for arthralgias and back pain.  Skin:  Negative for rash.  Neurological:  Negative for light-headedness and headaches.       Body feels unbalance at times   Psychiatric/Behavioral:  Positive for sleep disturbance. Negative for dysphoric mood. The patient is not nervous/anxious.        Objective:   Vitals:   12/16/23 1050  BP: 120/68  Pulse: 65  Temp: 98 F (36.7 C)  SpO2: 96%   There were no vitals filed for this visit. Body mass index is 27.41 kg/m.  BP Readings from Last 3 Encounters:  12/16/23 120/68  06/04/23 116/68  03/04/23 (!) 142/88    Wt Readings from Last 3 Encounters:  09/25/23 175 lb (79.4 kg)  06/04/23 175 lb (79.4 kg)  03/04/23 175 lb (79.4 kg)      Physical Exam Constitutional: He appears well-developed and well-nourished. No distress.  HENT:  Head: Normocephalic and atraumatic.  Right Ear: External ear normal.  Left Ear: External ear normal.  Normal ear canals and TM b/l  Mouth/Throat: Oropharynx is clear and moist. Eyes: Conjunctivae and EOM are normal.  Neck: Neck supple. No tracheal deviation present. No thyromegaly present.  No carotid bruit  Cardiovascular: Normal rate, regular rhythm, normal heart sounds and intact distal pulses.   No murmur heard.  No lower extremity edema. Pulmonary/Chest: Effort normal and breath sounds normal. No respiratory distress. He has no wheezes. He has no rales.  Abdominal: Soft. He exhibits no distension. There is no tenderness.  Genitourinary: deferred  Lymphadenopathy:   He has no cervical adenopathy.  Skin: Skin is warm and dry. He is not diaphoretic.  Psychiatric: He has a normal mood and affect. His behavior is normal.         Assessment & Plan:   Physical exam: Screening blood work  ordered Exercise  doing exercises a few times a week - advised increasing exercise  Weight  is ok Substance abuse   none   Reviewed recommended immunizations.   Health Maintenance  Topic Date Due   Hepatitis B Vaccines 19-59 Average Risk (1 of 3 - 19+ 3-dose series) Never done   Pneumococcal Vaccine: 50+ Years (2 of 2 - PCV) 10/09/2016   Zoster Vaccines- Shingrix  (1 of 2) Never done   COVID-19 Vaccine (3 - 2025-26 season) 12/02/2023   DTaP/Tdap/Td (3 - Td or Tdap) 02/24/2024   Medicare Annual Wellness (AWV)  09/24/2024   Fecal DNA (Cologuard)  01/08/2025   HIV Screening  Completed   HPV VACCINES  Aged Out   Meningococcal B Vaccine  Aged Out   Influenza Vaccine  Discontinued   Hepatitis C Screening  Discontinued     See Problem List for Assessment and Plan of chronic medical problems.

## 2023-12-15 NOTE — Patient Instructions (Addendum)
 Blood work was ordered.       Medications changes include :   None     Return in about 6 months (around 06/14/2024) for follow up - can be virtual .    Health Maintenance, Male Adopting a healthy lifestyle and getting preventive care are important in promoting health and wellness. Ask your health care provider about: The right schedule for you to have regular tests and exams. Things you can do on your own to prevent diseases and keep yourself healthy. What should I know about diet, weight, and exercise? Eat a healthy diet  Eat a diet that includes plenty of vegetables, fruits, low-fat dairy products, and lean protein. Do not eat a lot of foods that are high in solid fats, added sugars, or sodium. Maintain a healthy weight Body mass index (BMI) is a measurement that can be used to identify possible weight problems. It estimates body fat based on height and weight. Your health care provider can help determine your BMI and help you achieve or maintain a healthy weight. Get regular exercise Get regular exercise. This is one of the most important things you can do for your health. Most adults should: Exercise for at least 150 minutes each week. The exercise should increase your heart rate and make you sweat (moderate-intensity exercise). Do strengthening exercises at least twice a week. This is in addition to the moderate-intensity exercise. Spend less time sitting. Even light physical activity can be beneficial. Watch cholesterol and blood lipids Have your blood tested for lipids and cholesterol at 57 years of age, then have this test every 5 years. You may need to have your cholesterol levels checked more often if: Your lipid or cholesterol levels are high. You are older than 57 years of age. You are at high risk for heart disease. What should I know about cancer screening? Many types of cancers can be detected early and may often be prevented. Depending on your health  history and family history, you may need to have cancer screening at various ages. This may include screening for: Colorectal cancer. Prostate cancer. Skin cancer. Lung cancer. What should I know about heart disease, diabetes, and high blood pressure? Blood pressure and heart disease High blood pressure causes heart disease and increases the risk of stroke. This is more likely to develop in people who have high blood pressure readings or are overweight. Talk with your health care provider about your target blood pressure readings. Have your blood pressure checked: Every 3-5 years if you are 21-71 years of age. Every year if you are 61 years old or older. If you are between the ages of 16 and 91 and are a current or former smoker, ask your health care provider if you should have a one-time screening for abdominal aortic aneurysm (AAA). Diabetes Have regular diabetes screenings. This checks your fasting blood sugar level. Have the screening done: Once every three years after age 46 if you are at a normal weight and have a low risk for diabetes. More often and at a younger age if you are overweight or have a high risk for diabetes. What should I know about preventing infection? Hepatitis B If you have a higher risk for hepatitis B, you should be screened for this virus. Talk with your health care provider to find out if you are at risk for hepatitis B infection. Hepatitis C Blood testing is recommended for: Everyone born from [birth year] through 1964. Anyone with known risk  factors for hepatitis C. Sexually transmitted infections (STIs) You should be screened each year for STIs, including gonorrhea and chlamydia, if: You are sexually active and are younger than 57 years of age. You are older than 57 years of age and your health care provider tells you that you are at risk for this type of infection. Your sexual activity has changed since you were last screened, and you are at increased risk for  chlamydia or gonorrhea. Ask your health care provider if you are at risk. Ask your health care provider about whether you are at high risk for HIV. Your health care provider may recommend a prescription medicine to help prevent HIV infection. If you choose to take medicine to prevent HIV, you should first get tested for HIV. You should then be tested every 3 months for as long as you are taking the medicine. Follow these instructions at home: Alcohol use Do not drink alcohol if your health care provider tells you not to drink. If you drink alcohol: Limit how much you have to 0-2 drinks a day. Know how much alcohol is in your drink. In the [place] one drink equals one 12 oz bottle of beer (355 mL), one 5 oz glass of wine (148 mL), or one 1 oz glass of hard liquor (44 mL). Lifestyle Do not use any products that contain nicotine or tobacco. These products include cigarettes, chewing tobacco, and vaping devices, such as e-cigarettes. If you need help quitting, ask your health care provider. Do not use street drugs. Do not share needles. Ask your health care provider for help if you need support or information about quitting drugs. General instructions Schedule regular health, dental, and eye exams. Stay current with your vaccines. Tell your health care provider if: You often feel depressed. You have ever been abused or do not feel safe at home. Summary Adopting a healthy lifestyle and getting preventive care are important in promoting health and wellness. Follow your health care provider's instructions about healthy diet, exercising, and getting tested or screened for diseases. Follow your health care provider's instructions on monitoring your cholesterol and blood pressure. This information is not intended to replace advice given to you by your health care provider. Make sure you discuss any questions you have with your health care provider. Document Revised: 08/08/2020 Document Reviewed:  08/08/2020 Elsevier Patient Education  2024 [employer].

## 2023-12-16 ENCOUNTER — Ambulatory Visit (INDEPENDENT_AMBULATORY_CARE_PROVIDER_SITE_OTHER): Admitting: Internal Medicine

## 2023-12-16 VITALS — BP 120/68 | HR 65 | Temp 98.0°F | Ht 67.0 in

## 2023-12-16 DIAGNOSIS — D72829 Elevated white blood cell count, unspecified: Secondary | ICD-10-CM | POA: Diagnosis not present

## 2023-12-16 DIAGNOSIS — Z125 Encounter for screening for malignant neoplasm of prostate: Secondary | ICD-10-CM

## 2023-12-16 DIAGNOSIS — Q743 Arthrogryposis multiplex congenita: Secondary | ICD-10-CM | POA: Diagnosis not present

## 2023-12-16 DIAGNOSIS — Z8673 Personal history of transient ischemic attack (TIA), and cerebral infarction without residual deficits: Secondary | ICD-10-CM

## 2023-12-16 DIAGNOSIS — I10 Essential (primary) hypertension: Secondary | ICD-10-CM | POA: Diagnosis not present

## 2023-12-16 DIAGNOSIS — R7303 Prediabetes: Secondary | ICD-10-CM

## 2023-12-16 DIAGNOSIS — Z95811 Presence of heart assist device: Secondary | ICD-10-CM

## 2023-12-16 DIAGNOSIS — R600 Localized edema: Secondary | ICD-10-CM | POA: Diagnosis not present

## 2023-12-16 DIAGNOSIS — E782 Mixed hyperlipidemia: Secondary | ICD-10-CM | POA: Diagnosis not present

## 2023-12-16 DIAGNOSIS — Z0000 Encounter for general adult medical examination without abnormal findings: Secondary | ICD-10-CM | POA: Diagnosis not present

## 2023-12-16 NOTE — Assessment & Plan Note (Addendum)
 Chronic Not currently on any medication stable Likely venous insufficiency with some dermatitis and healed ulcers Elevate legs Needs to do more walking during day and improve strength

## 2023-12-16 NOTE — Assessment & Plan Note (Signed)
 Chronic Lab Results  Component Value Date   HGBA1C 5.7 05/15/2022   Check a1c Low sugar / carb diet Stressed regular exercise

## 2023-12-16 NOTE — Assessment & Plan Note (Signed)
 Chronic Blood pressure well controlled Continue to monitor BP at home Continue carvedilol  6.25 mg twice daily, telmisartan  40 mg daily CMP, CBC

## 2023-12-16 NOTE — Assessment & Plan Note (Addendum)
 Chronic PPM for complete heart block Following with cardiology/electrophysiology

## 2023-12-16 NOTE — Assessment & Plan Note (Signed)
 Chronic Regular exercise and healthy diet encouraged Lipid panel, CMP, TSH Continue atorvastatin  20 mg daily

## 2023-12-16 NOTE — Assessment & Plan Note (Signed)
Chronic CBC

## 2023-12-16 NOTE — Assessment & Plan Note (Signed)
 Chronic On disability Mobility limited Joint pain controlled with Celebrex  200 mg twice daily as needed, Tylenol  as needed Stressed regular exercise and staying as active as possible

## 2023-12-16 NOTE — Assessment & Plan Note (Signed)
 History of TIA Continue atorvastatin  20 mg daily Blood pressure and sugar well-controlled

## 2023-12-17 ENCOUNTER — Encounter: Payer: Self-pay | Admitting: Internal Medicine

## 2023-12-18 ENCOUNTER — Other Ambulatory Visit (INDEPENDENT_AMBULATORY_CARE_PROVIDER_SITE_OTHER)

## 2023-12-18 ENCOUNTER — Telehealth: Payer: Self-pay

## 2023-12-18 DIAGNOSIS — Z125 Encounter for screening for malignant neoplasm of prostate: Secondary | ICD-10-CM | POA: Diagnosis not present

## 2023-12-18 DIAGNOSIS — E782 Mixed hyperlipidemia: Secondary | ICD-10-CM | POA: Diagnosis not present

## 2023-12-18 DIAGNOSIS — I10 Essential (primary) hypertension: Secondary | ICD-10-CM | POA: Diagnosis not present

## 2023-12-18 DIAGNOSIS — R7303 Prediabetes: Secondary | ICD-10-CM | POA: Diagnosis not present

## 2023-12-18 LAB — LIPID PANEL
Cholesterol: 113 mg/dL (ref 0–200)
HDL: 35.8 mg/dL — ABNORMAL LOW (ref >39.00)
LDL Cholesterol: 58 mg/dL (ref 0–99)
NonHDL: 77.62
Total CHOL/HDL Ratio: 3
Triglycerides: 96 mg/dL (ref 0.0–149.0)
VLDL: 19.2 mg/dL (ref 0.0–40.0)

## 2023-12-18 LAB — CBC WITH DIFFERENTIAL/PLATELET
Basophils Absolute: 0.1 K/uL (ref 0.0–0.1)
Basophils Relative: 0.8 % (ref 0.0–3.0)
Eosinophils Absolute: 0.2 K/uL (ref 0.0–0.7)
Eosinophils Relative: 2.5 % (ref 0.0–5.0)
HCT: 43.2 % (ref 39.0–52.0)
Hemoglobin: 14.6 g/dL (ref 13.0–17.0)
Lymphocytes Relative: 18 % (ref 12.0–46.0)
Lymphs Abs: 1.7 K/uL (ref 0.7–4.0)
MCHC: 33.9 g/dL (ref 30.0–36.0)
MCV: 92.5 fl (ref 78.0–100.0)
Monocytes Absolute: 0.9 K/uL (ref 0.1–1.0)
Monocytes Relative: 9.9 % (ref 3.0–12.0)
Neutro Abs: 6.4 K/uL (ref 1.4–7.7)
Neutrophils Relative %: 68.8 % (ref 43.0–77.0)
Platelets: 284 K/uL (ref 150.0–400.0)
RBC: 4.67 Mil/uL (ref 4.22–5.81)
RDW: 14.2 % (ref 11.5–15.5)
WBC: 9.2 K/uL (ref 4.0–10.5)

## 2023-12-18 LAB — HEMOGLOBIN A1C: Hgb A1c MFr Bld: 5.9 % (ref 4.6–6.5)

## 2023-12-18 LAB — COMPREHENSIVE METABOLIC PANEL WITH GFR
ALT: 18 U/L (ref 0–53)
AST: 13 U/L (ref 0–37)
Albumin: 4.3 g/dL (ref 3.5–5.2)
Alkaline Phosphatase: 70 U/L (ref 39–117)
BUN: 21 mg/dL (ref 6–23)
CO2: 22 meq/L (ref 19–32)
Calcium: 9.8 mg/dL (ref 8.4–10.5)
Chloride: 107 meq/L (ref 96–112)
Creatinine, Ser: 0.71 mg/dL (ref 0.40–1.50)
GFR: 102.08 mL/min (ref >60.00)
Glucose, Bld: 110 mg/dL — ABNORMAL HIGH (ref 70–99)
Potassium: 4.7 meq/L (ref 3.5–5.1)
Sodium: 139 meq/L (ref 135–145)
Total Bilirubin: 0.6 mg/dL (ref 0.2–1.2)
Total Protein: 7.4 g/dL (ref 6.0–8.3)

## 2023-12-18 LAB — PSA: PSA: 1.44 ng/mL (ref 0.10–4.00)

## 2023-12-18 LAB — TSH: TSH: 1.22 u[IU]/mL (ref 0.35–5.50)

## 2023-12-18 NOTE — Telephone Encounter (Signed)
 Verbals given to William Hartman today.

## 2023-12-18 NOTE — Telephone Encounter (Signed)
 Okay for orders?

## 2023-12-18 NOTE — Telephone Encounter (Signed)
 Copied from CRM #[id number]. Topic: Clinical - Medical Advice >> Dec 18, 2023  1:11 PM Anairis L wrote: Reason for CRM: [place] from [facility] wanting verbal ok to start until 12/22/2023, instead of this week per patients request.   [phone]

## 2023-12-19 ENCOUNTER — Ambulatory Visit: Payer: Self-pay | Admitting: Internal Medicine

## 2023-12-27 DIAGNOSIS — I10 Essential (primary) hypertension: Secondary | ICD-10-CM | POA: Diagnosis not present

## 2023-12-27 DIAGNOSIS — Z95811 Presence of heart assist device: Secondary | ICD-10-CM | POA: Diagnosis not present

## 2023-12-27 DIAGNOSIS — Z8673 Personal history of transient ischemic attack (TIA), and cerebral infarction without residual deficits: Secondary | ICD-10-CM | POA: Diagnosis not present

## 2023-12-27 DIAGNOSIS — Z9181 History of falling: Secondary | ICD-10-CM | POA: Diagnosis not present

## 2023-12-31 ENCOUNTER — Telehealth: Payer: Self-pay

## 2023-12-31 NOTE — Telephone Encounter (Signed)
 Copied from CRM #[id number]. Topic: Clinical - Home Health Verbal Orders >> Dec 31, 2023  1:35 PM Pinkey ORN wrote: Caller/Agency: [facility] Callback Number: [phone] Service Requested: Physical Therapy Frequency: 2 x 2 Weeks / 1 for 4 weeks focusing on gate balance and strength-strengthening   Any new concerns about the patient? No

## 2024-01-01 NOTE — Telephone Encounter (Signed)
 Okay for orders?

## 2024-01-01 NOTE — Telephone Encounter (Unsigned)
 Copied from CRM #[id number]. Topic: Clinical - Home Health Verbal Orders >> Dec 31, 2023  1:35 PM Pinkey ORN wrote: Caller/Agency: [facility] Callback Number: [phone] Service Requested: Physical Therapy Frequency: 2 x 2 Weeks / 1 for 4 weeks focusing on gate balance and strength-strengthening   Any new concerns about the patient? No >> Jan 01, 2024 11:01 AM Rosina BIRCH wrote: Liji from [facility] called checking on orders and want to know if the provider has addressed it yet [phone]

## 2024-01-02 NOTE — Telephone Encounter (Signed)
 Verbals given today.

## 2024-01-08 DIAGNOSIS — Z8673 Personal history of transient ischemic attack (TIA), and cerebral infarction without residual deficits: Secondary | ICD-10-CM | POA: Diagnosis not present

## 2024-01-08 DIAGNOSIS — Z95811 Presence of heart assist device: Secondary | ICD-10-CM | POA: Diagnosis not present

## 2024-01-08 DIAGNOSIS — Q743 Arthrogryposis multiplex congenita: Secondary | ICD-10-CM | POA: Diagnosis not present

## 2024-01-08 DIAGNOSIS — I10 Essential (primary) hypertension: Secondary | ICD-10-CM | POA: Diagnosis not present

## 2024-01-13 ENCOUNTER — Ambulatory Visit: Payer: Self-pay

## 2024-01-13 DIAGNOSIS — Z8673 Personal history of transient ischemic attack (TIA), and cerebral infarction without residual deficits: Secondary | ICD-10-CM | POA: Diagnosis not present

## 2024-01-13 DIAGNOSIS — Q743 Arthrogryposis multiplex congenita: Secondary | ICD-10-CM | POA: Diagnosis not present

## 2024-01-13 DIAGNOSIS — I10 Essential (primary) hypertension: Secondary | ICD-10-CM | POA: Diagnosis not present

## 2024-01-13 DIAGNOSIS — Z95811 Presence of heart assist device: Secondary | ICD-10-CM | POA: Diagnosis not present

## 2024-01-13 DIAGNOSIS — Z9181 History of falling: Secondary | ICD-10-CM | POA: Diagnosis not present

## 2024-01-13 NOTE — Telephone Encounter (Signed)
 Copied from CRM [id number]. Topic: General - Other >> Jan 13, 2024  9:18 AM Avram MATSU wrote: Reason for CRM: mallie is calling to report a fall on 01/09/24 pt is fine. [facility] aid  Kate:[phone]  ----------------------------------------------------------------------- From previous Reason for Contact - Red Word Triage: Red Word that prompted transfer to Nurse Triage:

## 2024-01-13 NOTE — Telephone Encounter (Signed)
 FYI Only or Action Required?: FYI only for provider.  Patient was last seen in primary care on 12/16/2023 by Geofm Glade PARAS, MD.  Called Nurse Triage reporting Fall.  Symptoms began several days ago.  Interventions attempted: Nothing.  Symptoms are: stable.  Triage Disposition: Home Care  Patient/caregiver understands and will follow disposition?: Yes

## 2024-01-13 NOTE — Telephone Encounter (Signed)
 Reason for Triage: : William Hartman is calling to report a fall on 01/09/24 pt is fine. [facility] aid    [phone]   Reason for Disposition  [1] Recent fall AND [2] no injury  Answer Assessment - Initial Assessment Questions William Hartman, [facility] aid with [facility], called to report a fall. Pt was going down driveway on scooter and fell off. He hadn't done it in a while and forgot how steep his driveway is. He had bump and scrape but we did therapy and he seemed fine. He kept down playing it.   Protocols used: Falls and [facility]

## 2024-01-13 NOTE — Telephone Encounter (Signed)
 Noted

## 2024-01-14 DIAGNOSIS — I10 Essential (primary) hypertension: Secondary | ICD-10-CM | POA: Diagnosis not present

## 2024-01-14 DIAGNOSIS — Q743 Arthrogryposis multiplex congenita: Secondary | ICD-10-CM | POA: Diagnosis not present

## 2024-01-14 DIAGNOSIS — Z8673 Personal history of transient ischemic attack (TIA), and cerebral infarction without residual deficits: Secondary | ICD-10-CM | POA: Diagnosis not present

## 2024-01-14 DIAGNOSIS — Z9181 History of falling: Secondary | ICD-10-CM | POA: Diagnosis not present

## 2024-01-14 DIAGNOSIS — Z95811 Presence of heart assist device: Secondary | ICD-10-CM | POA: Diagnosis not present

## 2024-01-20 DIAGNOSIS — Z95811 Presence of heart assist device: Secondary | ICD-10-CM | POA: Diagnosis not present

## 2024-01-20 DIAGNOSIS — Q743 Arthrogryposis multiplex congenita: Secondary | ICD-10-CM | POA: Diagnosis not present

## 2024-01-20 DIAGNOSIS — Z8673 Personal history of transient ischemic attack (TIA), and cerebral infarction without residual deficits: Secondary | ICD-10-CM | POA: Diagnosis not present

## 2024-01-20 DIAGNOSIS — I10 Essential (primary) hypertension: Secondary | ICD-10-CM | POA: Diagnosis not present

## 2024-01-20 DIAGNOSIS — Z9181 History of falling: Secondary | ICD-10-CM | POA: Diagnosis not present

## 2024-01-24 DIAGNOSIS — Z95811 Presence of heart assist device: Secondary | ICD-10-CM | POA: Diagnosis not present

## 2024-01-24 DIAGNOSIS — Z8673 Personal history of transient ischemic attack (TIA), and cerebral infarction without residual deficits: Secondary | ICD-10-CM | POA: Diagnosis not present

## 2024-01-24 DIAGNOSIS — I10 Essential (primary) hypertension: Secondary | ICD-10-CM | POA: Diagnosis not present

## 2024-01-24 DIAGNOSIS — Q743 Arthrogryposis multiplex congenita: Secondary | ICD-10-CM | POA: Diagnosis not present

## 2024-02-09 ENCOUNTER — Encounter: Payer: Self-pay | Admitting: Internal Medicine

## 2024-02-09 NOTE — Progress Notes (Unsigned)
    Subjective:    Patient ID: William Hartman, male    DOB: 1965-04-20, 58 y.o.   MRN: [id number]      HPI Dominyck is here for No chief complaint on file.   Reason for Visit: Mobility Evaluation    A power wheel chair will assist the patient in the home with ADLs; including cooking, cleaning and toilet ing. A cane, walker or manual wheel chair will not assist patient efficiently due to obesity, upper extremity pain and weakness. In addition, a power scooter will not be adequate due to the patients lack of ability to grip the tiller type steering of the equipment. Patient has the mental and physical capacity to follow and operate the electric devices safety instructions. Patient is willing to use in the home as well as outside of the home.      ADD the Upper and Lower Extremity Strength Measurement 0-5 scale        Medications and allergies reviewed with patient and updated if appropriate.  Current Outpatient Medications on File Prior to Visit  Medication Sig Dispense Refill   acetaminophen  (TYLENOL ) 325 MG tablet Take 650 mg by mouth every 6 (six) hours as needed for mild pain or headache.     atorvastatin  (LIPITOR) 20 MG tablet TAKE ONE TABLET BY MOUTH DAILY 90 tablet 0   carvedilol  (COREG ) 6.25 MG tablet TAKE ONE TABLET BY MOUTH TWICE DAILY WITH A MEAL 180 tablet 0   celecoxib  (CELEBREX ) 200 MG capsule Take 1 capsule (200 mg total) by mouth 2 (two) times daily as needed for mild pain (pain score 1-3) (for pain). 60 capsule 5   Melatonin 3 MG CAPS Take 1 capsule by mouth at bedtime as needed (for sleep).     telmisartan  (MICARDIS ) 40 MG tablet TAKE ONE TABLET BY MOUTH DAILY 90 tablet 0   No current facility-administered medications on file prior to visit.    Review of Systems     Objective:  There were no vitals filed for this visit. BP Readings from Last 3 Encounters:  12/16/23 120/68  06/04/23 116/68  03/04/23 (!) 142/88   Wt Readings from Last 3 Encounters:  09/25/23  175 lb (79.4 kg)  06/04/23 175 lb (79.4 kg)  03/04/23 175 lb (79.4 kg)   There is no height or weight on file to calculate BMI.    Physical Exam Constitutional:      General: He is not in acute distress.    Appearance: Normal appearance. He is not ill-appearing.  HENT:     Head: Normocephalic and atraumatic.  Musculoskeletal:     Right lower leg: Edema (trace) present.     Left lower leg: Edema (trace) present.     Comments: Contracture of wrists, fingers, shoulder, knees, feet  ROM 25% only - LUE, LLE slightly better than RUE, RLE  Skin:    General: Skin is warm and dry.     Findings: Erythema (b/l Lower legs - venous stasis dermatitis) present. No rash.  Neurological:     Mental Status: He is alert and oriented to person, place, and time.     Motor: Weakness (4/5 all extremities) present.  Psychiatric:        Mood and Affect: Mood normal.           Assessment & Plan:    See Problem List for Assessment and Plan of chronic medical problems.

## 2024-02-09 NOTE — Patient Instructions (Incomplete)
 William Hartman

## 2024-02-10 ENCOUNTER — Ambulatory Visit (INDEPENDENT_AMBULATORY_CARE_PROVIDER_SITE_OTHER): Admitting: Internal Medicine

## 2024-02-10 VITALS — BP 120/80 | HR 82 | Temp 98.2°F | Ht 67.0 in

## 2024-02-10 DIAGNOSIS — Q743 Arthrogryposis multiplex congenita: Secondary | ICD-10-CM

## 2024-02-10 DIAGNOSIS — E782 Mixed hyperlipidemia: Secondary | ICD-10-CM

## 2024-02-10 DIAGNOSIS — I10 Essential (primary) hypertension: Secondary | ICD-10-CM | POA: Diagnosis not present

## 2024-02-10 NOTE — Assessment & Plan Note (Signed)
 Chronic Has significant disability secondary to his arthrogryposis multiplex He has contractures of several joints resulting in significant decreased range of motion and weakness He is unable to walk long distances and is not able to perform his ADLs efficiently without a power chair-this is medically necessary He is able to use a rollator for short distances and he will also request that - must be one with straight handle

## 2024-02-10 NOTE — Assessment & Plan Note (Signed)
Chronic Regular exercise and healthy diet encouraged Continue atorvastatin 20 mg daily 

## 2024-02-10 NOTE — Assessment & Plan Note (Signed)
Chronic Blood pressure well controlled Continue to monitor BP at home Continue carvedilol 6.25 mg twice daily, telmisartan 40 mg daily

## 2024-02-11 ENCOUNTER — Encounter: Payer: Self-pay | Admitting: Internal Medicine

## 2024-02-14 ENCOUNTER — Telehealth: Payer: Self-pay

## 2024-02-14 NOTE — Telephone Encounter (Signed)
 Copied from CRM #[id number]. Topic: Clinical - Medication Prior Auth >> Feb 14, 2024 10:38 AM Alfonso ORN wrote: Reason for CRM: William Hartman with [facility] pt needing medical equipment : power chair and rollator

## 2024-02-14 NOTE — Addendum Note (Signed)
 Addended by: GEOFM GLADE PARAS on: 02/14/2024 11:55 AM   Modules accepted: Orders

## 2024-02-14 NOTE — Telephone Encounter (Signed)
 No number or info on how to contact Melissa back from [place].  I have faxed order over to [facility] today with a copy of pictures patient sent via my-chart.

## 2024-02-20 ENCOUNTER — Other Ambulatory Visit: Payer: Self-pay | Admitting: Internal Medicine

## 2024-03-03 ENCOUNTER — Ambulatory Visit: Payer: Medicare PPO

## 2024-03-03 DIAGNOSIS — I442 Atrioventricular block, complete: Secondary | ICD-10-CM | POA: Diagnosis not present

## 2024-03-04 LAB — CUP PACEART REMOTE DEVICE CHECK
Battery Remaining Longevity: 78 mo
Battery Voltage: 3 V
Brady Statistic AP VP Percent: 34.35 %
Brady Statistic AP VS Percent: 0 %
Brady Statistic AS VP Percent: 65.6 %
Brady Statistic AS VS Percent: 0.05 %
Brady Statistic RA Percent Paced: 34.31 %
Brady Statistic RV Percent Paced: 99.95 %
Date Time Interrogation Session: 20251202013900
Implantable Lead Connection Status: 753985
Implantable Lead Connection Status: 753985
Implantable Lead Connection Status: 753985
Implantable Lead Implant Date: 20130401
Implantable Lead Implant Date: 20130401
Implantable Lead Implant Date: 20171124
Implantable Lead Location: 753858
Implantable Lead Location: 753859
Implantable Lead Location: 753860
Implantable Lead Model: 4598
Implantable Lead Model: 5076
Implantable Lead Model: 5076
Implantable Pulse Generator Implant Date: 20241202
Lead Channel Impedance Value: 304 Ohm
Lead Channel Impedance Value: 380 Ohm
Lead Channel Impedance Value: 399 Ohm
Lead Channel Impedance Value: 418 Ohm
Lead Channel Impedance Value: 437 Ohm
Lead Channel Impedance Value: 494 Ohm
Lead Channel Impedance Value: 513 Ohm
Lead Channel Impedance Value: 513 Ohm
Lead Channel Impedance Value: 589 Ohm
Lead Channel Impedance Value: 798 Ohm
Lead Channel Impedance Value: 817 Ohm
Lead Channel Impedance Value: 817 Ohm
Lead Channel Impedance Value: 874 Ohm
Lead Channel Impedance Value: 874 Ohm
Lead Channel Pacing Threshold Amplitude: 0.75 V
Lead Channel Pacing Threshold Amplitude: 0.875 V
Lead Channel Pacing Threshold Pulse Width: 0.4 ms
Lead Channel Pacing Threshold Pulse Width: 0.4 ms
Lead Channel Sensing Intrinsic Amplitude: 4.5 mV
Lead Channel Sensing Intrinsic Amplitude: 4.5 mV
Lead Channel Setting Pacing Amplitude: 1.75 V
Lead Channel Setting Pacing Amplitude: 2 V
Lead Channel Setting Pacing Amplitude: 3.5 V
Lead Channel Setting Pacing Pulse Width: 0.4 ms
Lead Channel Setting Pacing Pulse Width: 1 ms
Lead Channel Setting Sensing Sensitivity: 4 mV
Zone Setting Status: 755011
Zone Setting Status: 755011

## 2024-03-05 ENCOUNTER — Ambulatory Visit: Payer: Self-pay | Admitting: Cardiology

## 2024-03-10 ENCOUNTER — Ambulatory Visit: Attending: Internal Medicine | Admitting: Physical Therapy

## 2024-03-10 DIAGNOSIS — M6249 Contracture of muscle, multiple sites: Secondary | ICD-10-CM | POA: Insufficient documentation

## 2024-03-10 DIAGNOSIS — R2681 Unsteadiness on feet: Secondary | ICD-10-CM | POA: Insufficient documentation

## 2024-03-10 DIAGNOSIS — R2689 Other abnormalities of gait and mobility: Secondary | ICD-10-CM | POA: Insufficient documentation

## 2024-03-10 DIAGNOSIS — M6281 Muscle weakness (generalized): Secondary | ICD-10-CM | POA: Insufficient documentation

## 2024-03-10 NOTE — Progress Notes (Signed)
 Remote PPM Transmission

## 2024-03-10 NOTE — Therapy (Incomplete)
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 positioned for stability, safety and efficient propulsion  []  adjustable axle allows for vertical, horizontal, camber and overall width changes  throughout the wheels for adjustment of the client's exact needs and abilities.   []  adjustable axle increases the stability and function of the chair allowing for adjustment of the center of gravity.   []  accommodates the client's anatomical position in the chair maximizing independence in mobility and maneuverability in all environments.   []  create a minimal fixed tilt-in space to assist in positioning.   []  Describe users full-time manual wheelchair activity patterns:___    []  Power assist Comments:  []  prevent repetitive use injuries  []  repetitive strain injury present in    shoulder girdle    []  shoulder pain is (> or =) to 7/10     during manual propulsion       Current Pain _____/10  []  requires conservation of energy to participate in MRADL(s) runable to propel up ramps or curbs using manual wheelchair  []  been K0005 user greater than one year  []  user unwilling to use power       wheelchair (reason): []  less expensive option to power   wheelchair   []  rim activated power assist -      decreased strength   []  Heavy duty manual wheelchair       K0006     Arm:    []  both []  right  []  left     Foot:   []  both []  right  []  left     []  hemi height required    []  Dependent base  []  user exceeds 250lbs  []  non-functional ambulator    []  extreme spasticity  []  over active movement   []  broken frame/hx of repeated     repairs  []  able to self-propel in residence       []  lower seat to floor height required  []  unable to self-propel in residence   []  Extra heavy duty manual wheelchair  K0007     Arm:    []  both []  right  []  left     Foot:   []  both []  right  []  left     []  hemi height required  []  Dependent base  []  user exceeds 300lbs  []  non-functional ambulator    []  able to self-propel in residence   []  lower seat to floor height required  []   unable to self-propel in residence     []  Manual wheelchair with tilt [id number]      (Manual "Tilt-n-Space")  []  patient is dependent for transfers  []  patient requires frequent       positioning for pressure relief   []  patient requires frequent      positioning for poor/absent trunk control        []  Stroller Base  []  infant/child   []  unable to propel manual      wheelchair  []  allows for growth  []  non-functional ambulator  []  non-functional UE  []  independent mobility is not a goal at this time    MANUAL FRAME OPTIONS      Push handles  []  extended   []  angle adjustable   []  standard  []  caregiver access  []  caregiver assist    []  allows "hooking" to enable      increased ability to perform ADLs or maintain balance   []  Angle Adjustable Back  []  postural control  []  control of tone/spasticity  []  accommodation of range of motion  []  UE functional control  []  accommodation for seating system    Rear wheel placement  []  std/fixed  [] fully adjustableramputee   []  camber ________degree  []  removable rear wheel   []  non-removable rear wheel  Wheel size _______  Wheel style_______________________  []  improved UE access to wheels  []  increase propulsion ability  []  improved stability  []  changing angle in space for      improvement of postural stability  []  remove for transport    []  allow for seating system to fit on  base  []  amputee placement  []  1-arm drive access   r R  r L  []  enable propulsion of manual       wheelchair with one arm    []  amputee placement   Wheel rims/ Hand rims  []  Standard    []  Specialized-____ []  provide ability to propel manual   []  increase self-propulsion with hand wheelchair weakness/decreased grasp     []  Spoke protector/guard   []  prevent hands from getting caught in spokes   Tires:  []  pneumatic  []  flat free inserts  []  solid  Style:  []  decrease roll resistance              []  prevent frequent flats  []  increase shock absorbency  []  decrease maintenance   []  decrease pain from road shock    []  decrease spasms from road shock    Wheel Locks:    []  push []  pull []  scissor  []  lock wheels for transfers  []  lock wheels from rolling   Brake/wheel lock extension:  []  R  []  L  []  allow user to operate wheel locks due to decreased reach or strength   Caster housing:  Caster size:                      Style:                                          []  suspension fork  []  maneuverability   []  stability of wheelchair   []  durability  []  maintenance  []  angle adjustment for posture  []  allow for feet to come under        wheelchair base  []  allows change in seat  to floor  height   []  increase shock absorbency  []  decrease pain from road shock  []  decrease spasms from road    shock   []  Side guards  []  prevent clothing getting caught in wheel or becoming soiled   [] provide hip and pelvic stability  []  eliminates contact between body and wheels  []  limit hand contact with wheels   []  Anti-tippers      []  prevent wheelchair from tipping    backward  []   assist caregiver with curbs     POWER MOBILITY      []  Scooter/POV    []  can safely operate   []  can safely transfer   []  has adequate trunk stability   []  cannot functionally propel  manual wheelchair    []  Power mobility base    []  non-ambulatory   []  cannot functionally propel manual wheelchair   []  cannot functionally and safely      operate scooter/POV  []  can safely operate power       wheelchair  []  home is accessible  []  willing to use power wheelchair     Tilt  []  Powered tilt on powered chair  []  Powered tilt on manual chair  []  Manual tilt on manual chair Comments:  []  change position for pressure      []  elief/cannot weight shift   []  change position against      gravitational force on head and      shoulders   []  decrease pain  []  blood pressure management   []  control autonomic dysreflexia  []  decrease respiratory distress  []  management of spasticity  []  management of low tone  []  facilitate postural control   []  rest periods   []  control edema  []  increase sitting tolerance   []  aid with transfers     Recline   []  Power recline on power chair  []  Manual recline on manual chair  Comments:    []  intermittent catheterization  []  manage spasticity  []  accommodate femur to back angle  []  change position for pressure relief/cannot weight shift rhigh risk of pressure sore development  []  tilt alone does not accomplish     effective pressure relief, maximum pressure relief achieved at -      _______ degrees tilt   _______ degrees recline   []  difficult to transfer to and from bed []  rest periods and sleeping in chair  []  repositioning for transfers  []  bring to full recline for ADL care  []  clothing/diaper changes in chair  []  gravity PEG tube feeding  []  head positioning  []  decrease pain  []  blood pressure management   []  control autonomic dysreflexia  []  decrease respiratory distress  []  user on ventilator     Elevator on mobility base  []  Power  wheelchair  []  Scooter  []  increase Indep in transfers   []  increase Indep in ADLs    []  bathroom function and safety  []  kitchen/cooking function and safety  []  shopping  []  raise height for communication at standing level  []  raise height for eye contact which reduces cervical neck strain and pain  []  drive at raised height for safety and navigating crowds  []  Other:   []  Vertical position system  (anterior tilt)     (Drive locks-out)    []  Stand       (Drive enabled)  []  independent weight bearing  []  decrease joint contractures  []  decrease/manage spasticity  []  decrease/manage  spasms  []  pressure distribution away from   scapula, sacrum, coccyx, and ischial tuberosity  []  increase digestion and elimination   []  access to counters and cabinets  []  increase reach  []  increase interaction with others at eye level, reduces neck strain  []  increase performance of       MRADL(s)      Power elevating legrest    []  Center mount (Single) 85-170 degrees       []  Standard (Pair) 100-170 degrees  []  position legs at 90 degrees, not available with std power ELR  []  center mount tucks into chair to decrease turning radius in home, not available with std power ELR  []  provide change in position for LE  []  elevate legs during recline    []  maintain placement of feet on      footplate  []  decrease edema  []  improve circulation  []  actuator needed to elevate legrest  []  actuator needed to articulate legrest preventing knees from flexing  []  Increase ground clearance over      curbs  []   STD (pair) independently                     elevate legrest   POWER WHEELCHAIR CONTROLS      Controls/input device  []  Expandable  []  Non-expandable  []  Proportional  []  Right Hand []  Left Hand  []  Non-proportional/switches/head-array  []  Electrical/proximity         []   Mechanical      Manufacturer:___________________   Type:________________________ []  provides access for controlling wheelchair   []  programming for accurate control  []  progressive disease/changing condition  []  required for alternative drive      controls       []  lacks motor control to operate  proportional drive control  []  unable to understand proportional controls  []  limited movement/strength  []  extraneous movement / tremors / ataxic / spastic       []  Upgraded electronics controller/harness    []  Single power (tilt or recline)   []  Expandable    []  Non-expandable plus   []  Multi-power (tilt, recline, power legrest, power seat lift, vertical positioning system, stand)  []  allows input device to communicate with drive motors  []  harness provides necessary connections between the controller, input device, and seat functions     []  needed in order to operate power seat functions through joystick/ input device  []  required for alternative drive controls     []  Enhanced display  []  required to connect all alternative drive controls   []  required for upgraded joystick      (lite-throw, heavy duty, micro)  []  Allows user to see in which mode and drive the wheelchair is set; necessary for alternate controls       []  Upgraded tracking electronics  []  correct tracking when on uneven surfaces makes switch driving more efficient and less fatiguing  []  increase safety when driving  []  increase ability to traverse thresholds    []  Safety / reset / mode switches     Type:    []  Used to change modes and stop the wheelchair when driving     []  Mount for joystick / input device/switches  []  swing away for access or transfers   []  attaches joystick / input device / switches to wheelchair   []  provides for consistent access  []  midline for optimal placement    []  Attendant controlled joystick plus  mount  []  safety  []  long distance driving  []  operation of seat functions  []  compliance with transportation regulations    []  Battery  []  required to power (power assist / scooter/ power wc / other):   []  Power  inverter (24V to 12V)  []  required for ventilator / respiratory equipment / other:     CHAIR OPTIONS MANUAL & POWER      Armrests   []  adjustable height []  removable  []  swing away []  fixed  []  flip back  []  reclining  []  full length pads []  desk []  tube arms []  gel pads  []  provide support with elbow at 90    []  remove/flip back/swing away for  transfers  []  provide support and positioning of upper body    []  allow to come closer to table top  []  remove for access to tables  []  provide support for w/c tray  []  change of height/angles for variable activities   []  Elbow support / Elbow stop  []  keep elbow positioned on arm pad  []  keep arms from falling off arm pad  during tilt and/or recline   Upper Extremity Support  []  Arm trough  []   R  []   L  Style:  []  swivel mount []  fixed mount   []  posterior hand support  []   tray  []  full tray  []  joystick cut out  []   R  []   L  Style:  []  decrease gravitational pull on      shoulders  []  provide support to increase UE  function  []  provide hand support in natural    position  []  position flaccid UE  []  decrease subluxation    []  decrease edema       []  manage spasticity   []  provide midline positioning  []  provide work surface  []  placement for AAC/ Computer/ EADL       Hangers/ Legrests   []  ______ degree  []  Elevating []  articulating  []  swing away []  fixed []  lift off  []  heavy duty  []  adjustable knee angle  []  adjustable calf panel   []  longer extension tube              []  provide LE support  []  maintain placement of feet on      footplate   []  accommodate lower leg length  []  accommodate to hamstring       tightness  []  enable transfers  []  provide change in position for LE's  []  elevate legs during recline    []  decrease edema  []  durability      Foot support   []  footplate []  R []  L []  flip up           []  Depth adjustable   []  angle adjustable  []  foot board/one piece    []  provide foot support  []   accommodate to ankle ROM  []  allow foot to go under wheelchair base  []  enable transfers     []  Shoe holders  []  position foot    []  decrease / manage spasticity  []  control position of LE  []  stability    []  safety     []  Ankle strap/heel      loops  []  support foot on foot support  []  decrease extraneous movement  []  provide input to heel   []  protect foot     []  Amputee adapter []  R  []  L  Style:                  Size:  []  Provide support for stump/residual extremity    []  Transportation tie-down  []  to provide crash tested tie-down brackets    []  Crutch/cane holder    []  O2 holder    []  IV hanger   []  Ventilator tray/mount    []  stabilize accessory on wheelchair       Component  Justification     []  Seat cushion      []  accommodate impaired sensation  []  decubitus ulcers present or history  []  unable to shift weight  []  increase pressure distribution  []  prevent pelvic extension  []  custom required "off-the-shelf"    seat cushion will not accommodate deformity  []  stabilize/promote pelvis alignment  []  stabilize/promote femur alignment  []  accommodate obliquity  []  accommodate multiple deformity  []  incontinent/accidents  []  low maintenance     []  seat mounts                 []  fixed []  removable  []  attach seat platform/cushion to wheelchair frame    []  Seat wedge    []  provide increased aggressiveness of seat shape to decrease sliding  down in the seat  []  accommodate ROM        []  Cover replacement   []  protect back or seat cushion  []  incontinent/accidents    []  Solid seat / insert    []  support cushion to prevent      hammocking  []  allows attachment of cushion to mobility base    []  Lateral pelvic/thigh/hip     support (Guides)     []  decrease abduction  []  accommodate pelvis  []  position upper legs  []  accommodate spasticity  []  removable for transfers     []  Lateral pelvic/thigh      supports mounts  []  fixed   []  swing-away   []  removable  []  mounts  lateral pelvic/thigh supports     []  mounts lateral pelvic/thigh supports swing-away or removable for transfers    []  Medial thigh support (Pommel)  [] decrease adduction  [] accommodate ROM  []  remove for transfers   []  alignment      []  Medial thigh   []  fixed      support mounts      []  swing-away   []  removable  []  mounts medial thigh supports   []  Mounts medial supports swing- away or removable for transfers       Component  Justification   []  Back       []  provide posterior trunk support []  facilitate tone  []  provide lumbar/sacral support []  accommodate deformity  []  support trunk in midline   []  custom required "off-the-shelf" back support will not accommodate deformity   []  provide lateral trunk support []  accommodate or decrease tone            []  Back mounts  []  fixed  []  removable  []  attach back rest/cushion to wheelchair frame   []  Lateral trunk      supports  []  R []  L  []  decrease lateral trunk leaning  []  accommodate asymmetry    []  contour for increased contact  []  safety    []  control of tone    []  Lateral trunk      supports mounts  []  fixed  []  swing-away   []  removable  []  mounts lateral trunk supports     []  Mounts  lateral trunk supports swing-away or removable for transfers   []  Anterior chest      strap, vest     []  decrease forward movement of shoulder  []  decrease forward movement of trunk  []  safety/stability  []  added abdominal support  []  trunk alignment  []  assistance with shoulder control   []  decrease shoulder elevation    []  Headrest      []  provide posterior head support  []  provide posterior neck support  []  provide lateral head support  []  provide anterior head support  []  support during tilt and recline  []  improve feeding     []  improve respiration  []  placement of switches  []  safety    []  accommodate ROM   []  accommodate tone  []  improve visual orientation   []  Headrest           []  fixed []  removable []  flip down      Mounting  hardware   []  swing-away laterals/switches  []  mount headrest   []  mounts headrest flip down or  removable for transfers  []  mount headrest swing-away laterals   []  mount switches     []  Neck Support    []  decrease neck rotation  []  decrease forward neck flexion   Pelvic Positioner    []  std hip belt          []  padded hip belt  []  dual pull hip belt  []  four point hip belt  []  stabilize tone  []  decrease falling out of chair  []  prevent excessive extension  []  special pull angle to control      rotation  []  pad for protection over boney   prominence  []  promote comfort    []  Essential needs        bag/pouch   []  medicines []  special food rorthotics []  clothing changes  []  diapers  []  catheter/hygiene []  ostomy supplies   The above equipment has a life- long use expectancy.  Growth and changes in medical and/or functional conditions would be the exceptions.   SUMMARY:  Why mobility device was selected; include why a lower level device is not appropriate: Patient requires use of a {manual/power:29824} wheelchair in order to perform safe and independent mobility in {his/her/their:21314} home and in order to perform {his/her/their:21314} MRADLs in a timely manner. Due to {his/her/their:21314} impaired endurance and history of *** {he/she/they:29828} is unable to safely and efficiently propel {him/her/them:29827}self in a manual wheelchair without putting significant strain on {his/her/their:21314} shoulder joints and putting {him/her/them:29827}self at risk for injury. Additionally, due to {his/her/their:21314} history of *** {he/she/they:29828} is unable to ambulate further than *** ft and has poor standing tolerance, therefore requiring power wheeled mobility.   ASSESSMENT:  CLINICAL IMPRESSION: Patient is a 58 y.o. male who was seen today for physical therapy evaluation for a new *** wheelchair.    OBJECTIVE IMPAIRMENTS Abnormal gait, cardiopulmonary status limiting activity, decreased  activity tolerance, decreased balance, decreased coordination, decreased endurance, decreased knowledge of use of DME, decreased mobility, difficulty walking, decreased ROM, decreased strength, hypomobility, increased fascial restrictions, impaired perceived functional ability, impaired flexibility, impaired UE functional use, improper body mechanics, postural dysfunction, and pain.   ACTIVITY LIMITATIONS carrying, lifting, bending, sitting, standing, squatting, stairs, transfers, bed mobility, bathing, toileting, dressing, reach over head, hygiene/grooming, and locomotion level  PARTICIPATION LIMITATIONS: meal prep, cleaning, laundry, shopping, and community activity  CLINICAL DECISION MAKING: Unstable/unpredictable  EVALUATION COMPLEXITY: High  GOALS: One time visit. No goals established.    PLAN: PT FREQUENCY: one time visit    Waddell Southgate, PT Waddell Southgate, PT, DPT, CSRS  03/10/2024, 1:59 PM    I concur with the above findings and recommendations of the therapist:  Physician name printed:         Physician's signature:      Date:

## 2024-03-27 ENCOUNTER — Encounter: Payer: Self-pay | Admitting: Pharmacist

## 2024-03-27 NOTE — Progress Notes (Signed)
 Pharmacy Quality Measure Review  This patient is appearing on a report for being at risk of failing the adherence measure for cholesterol (statin) and hypertension (ACEi/ARB) medications this calendar year.   Medication: Atorvastatin  & telmisartan  Last fill date: 02/20/24 for 90 day supply  Insurance report was not up to date. No action needed at this time.   Darrelyn Drum, PharmD, BCPS, CPP Clinical Pharmacist Practitioner [facility] [phone]

## 2024-06-04 ENCOUNTER — Ambulatory Visit

## 2024-09-25 ENCOUNTER — Ambulatory Visit
